# Patient Record
Sex: Male | Born: 1985 | Race: White | Hispanic: No | Marital: Married | Smoking: Never smoker
Health system: Southern US, Community
[De-identification: ages and names within clinical notes are randomized; demographics above are authoritative.]

## PROBLEM LIST (undated history)

## (undated) DIAGNOSIS — K635 Polyp of colon: Secondary | ICD-10-CM

## (undated) DIAGNOSIS — R198 Other specified symptoms and signs involving the digestive system and abdomen: Secondary | ICD-10-CM

## (undated) DIAGNOSIS — K589 Irritable bowel syndrome without diarrhea: Secondary | ICD-10-CM

## (undated) DIAGNOSIS — R09A2 Foreign body sensation, throat: Secondary | ICD-10-CM

## (undated) DIAGNOSIS — F419 Anxiety disorder, unspecified: Secondary | ICD-10-CM

## (undated) DIAGNOSIS — N200 Calculus of kidney: Secondary | ICD-10-CM

## (undated) DIAGNOSIS — K219 Gastro-esophageal reflux disease without esophagitis: Secondary | ICD-10-CM

## (undated) DIAGNOSIS — Z973 Presence of spectacles and contact lenses: Secondary | ICD-10-CM

## (undated) DIAGNOSIS — R0989 Other specified symptoms and signs involving the circulatory and respiratory systems: Secondary | ICD-10-CM

## (undated) DIAGNOSIS — U071 COVID-19: Secondary | ICD-10-CM

## (undated) DIAGNOSIS — Z8619 Personal history of other infectious and parasitic diseases: Secondary | ICD-10-CM

## (undated) DIAGNOSIS — I708 Atherosclerosis of other arteries: Secondary | ICD-10-CM

## (undated) DIAGNOSIS — R599 Enlarged lymph nodes, unspecified: Secondary | ICD-10-CM

## (undated) HISTORY — DX: Enlarged lymph nodes, unspecified: R59.9

## (undated) HISTORY — DX: Other specified symptoms and signs involving the circulatory and respiratory systems: R09.89

## (undated) HISTORY — PX: FINGER SURGERY: SHX640

## (undated) HISTORY — DX: Anxiety disorder, unspecified: F41.9

## (undated) HISTORY — PX: VASECTOMY: SHX75

## (undated) HISTORY — DX: Atherosclerosis of other arteries: I70.8

## (undated) HISTORY — PX: TONSILLECTOMY: SUR1361

## (undated) HISTORY — DX: Calculus of kidney: N20.0

## (undated) HISTORY — DX: Foreign body sensation, throat: R09.A2

## (undated) HISTORY — PX: WISDOM TOOTH EXTRACTION: SHX21

## (undated) HISTORY — DX: Personal history of other infectious and parasitic diseases: Z86.19

## (undated) HISTORY — DX: Irritable bowel syndrome, unspecified: K58.9

## (undated) HISTORY — DX: COVID-19: U07.1

## (undated) HISTORY — PX: COLONOSCOPY WITH ESOPHAGOGASTRODUODENOSCOPY (EGD): SHX5779

## (undated) HISTORY — DX: Other specified symptoms and signs involving the digestive system and abdomen: R19.8

## (undated) HISTORY — DX: Polyp of colon: K63.5

---
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
## 2004-10-23 ENCOUNTER — Emergency Department (HOSPITAL_COMMUNITY): Admission: EM | Admit: 2004-10-23 | Discharge: 2004-10-23 | Payer: Self-pay | Admitting: Emergency Medicine

## 2010-10-28 ENCOUNTER — Other Ambulatory Visit
Admission: RE | Admit: 2010-10-28 | Discharge: 2010-10-28 | Payer: Self-pay | Source: Home / Self Care | Admitting: Otolaryngology

## 2010-11-16 ENCOUNTER — Other Ambulatory Visit: Payer: Self-pay | Admitting: Otolaryngology

## 2010-11-16 DIAGNOSIS — R221 Localized swelling, mass and lump, neck: Secondary | ICD-10-CM

## 2010-11-18 ENCOUNTER — Ambulatory Visit
Admission: RE | Admit: 2010-11-18 | Discharge: 2010-11-18 | Disposition: A | Discharge status: Home / Self Care | Payer: Commercial Managed Care - PPO | Source: Ambulatory Visit | Attending: Otolaryngology | Admitting: Otolaryngology

## 2010-11-18 DIAGNOSIS — R221 Localized swelling, mass and lump, neck: Secondary | ICD-10-CM

## 2010-11-18 MED ORDER — IOHEXOL 300 MG/ML  SOLN: 75.0000 mL | Freq: Once | INTRAMUSCULAR | Status: AC | PRN

## 2014-01-23 ENCOUNTER — Ambulatory Visit
Admission: RE | Admit: 2014-01-23 | Discharge: 2014-01-23 | Disposition: A | Discharge status: Home / Self Care | Payer: Commercial Managed Care - PPO | Source: Ambulatory Visit | Attending: Family Medicine | Admitting: Family Medicine

## 2014-01-23 ENCOUNTER — Other Ambulatory Visit: Payer: Self-pay | Admitting: Family Medicine

## 2014-01-23 DIAGNOSIS — R109 Unspecified abdominal pain: Secondary | ICD-10-CM

## 2014-02-04 ENCOUNTER — Emergency Department: Payer: Self-pay | Admitting: Emergency Medicine

## 2014-02-04 LAB — CBC WITH DIFFERENTIAL/PLATELET
Basophil #: 0 10*3/uL (ref 0.0–0.1)
Basophil %: 0.3 %
EOS PCT: 2.2 %
Eosinophil #: 0.2 10*3/uL (ref 0.0–0.7)
HCT: 46.4 % (ref 40.0–52.0)
HGB: 15.5 g/dL (ref 13.0–18.0)
LYMPHS ABS: 1.4 10*3/uL (ref 1.0–3.6)
LYMPHS PCT: 20 %
MCH: 30.8 pg (ref 26.0–34.0)
MCHC: 33.5 g/dL (ref 32.0–36.0)
MCV: 92 fL (ref 80–100)
MONO ABS: 0.4 x10 3/mm (ref 0.2–1.0)
Monocyte %: 6.2 %
Neutrophil #: 5.1 10*3/uL (ref 1.4–6.5)
Neutrophil %: 71.3 %
Platelet: 207 10*3/uL (ref 150–440)
RBC: 5.04 10*6/uL (ref 4.40–5.90)
RDW: 12.7 % (ref 11.5–14.5)
WBC: 7.1 10*3/uL (ref 3.8–10.6)

## 2014-02-04 LAB — COMPREHENSIVE METABOLIC PANEL
ALBUMIN: 4.5 g/dL (ref 3.4–5.0)
Alkaline Phosphatase: 86 U/L
Anion Gap: 2 — ABNORMAL LOW (ref 7–16)
BILIRUBIN TOTAL: 0.5 mg/dL (ref 0.2–1.0)
BUN: 20 mg/dL — AB (ref 7–18)
Calcium, Total: 9.3 mg/dL (ref 8.5–10.1)
Chloride: 103 mmol/L (ref 98–107)
Co2: 32 mmol/L (ref 21–32)
Creatinine: 0.83 mg/dL (ref 0.60–1.30)
EGFR (African American): >60
GLUCOSE: 67 mg/dL (ref 65–99)
OSMOLALITY: 275 (ref 275–301)
Potassium: 4.2 mmol/L (ref 3.5–5.1)
SGOT(AST): 32 U/L (ref 15–37)
SGPT (ALT): 46 U/L (ref 12–78)
Sodium: 137 mmol/L (ref 136–145)
Total Protein: 8.2 g/dL (ref 6.4–8.2)

## 2014-02-04 LAB — URINALYSIS, COMPLETE
Bacteria: NONE SEEN
Bilirubin,UR: NEGATIVE
Glucose,UR: NEGATIVE mg/dL (ref 0–75)
KETONE: NEGATIVE
Leukocyte Esterase: NEGATIVE
Nitrite: NEGATIVE
Ph: 6 (ref 4.5–8.0)
Protein: NEGATIVE
RBC,UR: <1 /HPF (ref 0–5)
Specific Gravity: 1.02 (ref 1.003–1.030)
Squamous Epithelial: NONE SEEN
WBC UR: <1 /HPF (ref 0–5)

## 2014-02-04 LAB — LIPASE, BLOOD: LIPASE: 148 U/L (ref 73–393)

## 2014-04-12 ENCOUNTER — Ambulatory Visit: Payer: Self-pay | Admitting: Gastroenterology

## 2014-08-28 ENCOUNTER — Ambulatory Visit (INDEPENDENT_AMBULATORY_CARE_PROVIDER_SITE_OTHER): Payer: Commercial Managed Care - PPO | Admitting: Physician Assistant

## 2014-08-28 ENCOUNTER — Encounter: Payer: Self-pay | Admitting: Physician Assistant

## 2014-08-28 ENCOUNTER — Ambulatory Visit (HOSPITAL_BASED_OUTPATIENT_CLINIC_OR_DEPARTMENT_OTHER)
Admission: RE | Admit: 2014-08-28 | Discharge: 2014-08-28 | Disposition: A | Discharge status: Home / Self Care | Payer: Commercial Managed Care - PPO | Source: Ambulatory Visit | Attending: Physician Assistant | Admitting: Physician Assistant

## 2014-08-28 VITALS — BP 140/71 | HR 60 | Temp 98.6°F | Resp 16 | Ht 74.0 in | Wt 196.5 lb

## 2014-08-28 DIAGNOSIS — R0789 Other chest pain: Secondary | ICD-10-CM

## 2014-08-28 DIAGNOSIS — R079 Chest pain, unspecified: Secondary | ICD-10-CM | POA: Diagnosis not present

## 2014-08-28 DIAGNOSIS — F411 Generalized anxiety disorder: Secondary | ICD-10-CM

## 2014-08-28 MED ORDER — MELOXICAM 7.5 MG PO TABS: 7.5000 mg | ORAL_TABLET | Freq: Every day | ORAL | Status: DC

## 2014-08-28 NOTE — Progress Notes (Signed)
   Patient presents to clinic today to establish care.   Acute Concerns: Patient endorses left-sided chest pain for the past week.  Patient endorses recent bronchitis with pretty intense coughing.  Is unsure if pain is related to this.  Denies hx of asthma.  Denies hx of allergies.  Denies SOB. Endorses history of acid reflux requiring PPI therapy.  Endorses improvement in symptoms with Ibuprofen. Also endorses some increased belching over the past few days and some mild reflux.  Chronic Issues: Generalized Anxiety Disorder -- Since father passed away 2 years ago. Has been previously well controlled on 10 mg Lexapro daily. Denies depressed mood, anhedonia or SI/HI.  Denies history of panic attack.  Health Maintenance: Dental -- up-to-date Vision -- up-to-date; wears contacts; denies vision changes Immunizations -- Will be getting flu shot today.  Tetanus shot in 2012.  Past Medical History  Diagnosis Date  . Anxiety   . History of chicken pox   . Lymph node enlargement   . Colon polyp     Benign, colonoscopy 2015    Past Surgical History  Procedure Laterality Date  . Wisdom tooth extraction    . Tonsillectomy    . Finger surgery      fracture    No current outpatient prescriptions on file prior to visit.   No current facility-administered medications on file prior to visit.    No Known Allergies  Family History  Problem Relation Age of Onset  . Thyroid disease Mother     Living  . Cirrhosis Father [id number]    Deceased  . Cancer Paternal Grandfather     Lung Cancer  . Cancer Maternal Grandfather   . Lymphoma Sister     Neck  . Skin cancer Sister     History   Social History  . Marital Status: Single    Spouse Name: N/A    Number of Children: N/A  . Years of Education: N/A   Occupational History  . 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 Smoking status: Never Smoker   . Smokeless tobacco: Never Used  . Alcohol Use: 0.0 oz/week    0 Not specified  per week     Comment: rare  . Drug Use: No  . Sexual Activity:    Partners: Female   Other Topics Concern  . Not on file   Social History Narrative   ROS See HPI.  All other ROS are negative.  BP 140/71 mmHg  Pulse 60  Temp(Src) 98.6 F (37 C) (Oral)  Resp 16  Ht 6\' 2"  (1.88 m)  Wt 196 lb 8 oz (89.132 kg)  BMI 25.22 kg/m2  SpO2 100%  Physical Exam  Vitals reviewed.  Assessment/Plan: Left-sided chest wall pain Giving history sounds MSK in nature with maybe some mild pleurisy after recent URI.  EKG obtained revealing NSR at rate of 65 bpm.  Will obtain CXR to rule out pulmonary cause of symptoms.  Will begin Meloxicam 5 mg daily over the next week.  Patient also with some mild GERD so Zantac BID will be started. Topical Aspercreme to area. Follow-up in 1 week.  Addendum -- CXR negative for acute concerns.  Does reveal some changes coinciding with patient's resolved URI symptoms. Again continue care as discussed.  Follow-up 1 week.  Generalized anxiety disorder Patient doing well with Lexapro.  Negative SI/HI. Continue current regimen.  Follow-up in 6 months for re-evaluation

## 2014-08-28 NOTE — Progress Notes (Signed)
Pre visit review using our clinic review tool, if applicable. No additional management support is needed unless otherwise documented below in the visit note/SLS  

## 2014-08-28 NOTE — Assessment & Plan Note (Signed)
Patient doing well with Lexapro.  Negative SI/HI. Continue current regimen.  Follow-up in 6 months for re-evaluation

## 2014-08-28 NOTE — Patient Instructions (Signed)
Please take Mobic daily as directed.  Increase fluids.  Use tylenol for breakthrough pain.  Start Zantac 150 mg twice daily over the next week to help with reflux symptoms. Go downstairs for your x-ray. I will call you with your results.  Follow-up with me in 1 week unless I tell you otherwise when we call you with results.  If chest pain acutely worsens or you notice racing heart of shortness of breath, please call 911 or have someone drive you to the nearest ER.

## 2014-08-28 NOTE — Assessment & Plan Note (Addendum)
Giving history sounds MSK in nature with maybe some mild pleurisy after recent URI.  EKG obtained revealing NSR at rate of 65 bpm.  Will obtain CXR to rule out pulmonary cause of symptoms.  Will begin Meloxicam 5 mg daily over the next week.  Patient also with some mild GERD so Zantac BID will be started. Topical Aspercreme to area. Follow-up in 1 week.  Addendum -- CXR negative for acute concerns.  Does reveal some changes coinciding with patient's resolved URI symptoms. Again continue care as discussed.  Follow-up 1 week.

## 2014-10-24 ENCOUNTER — Ambulatory Visit: Payer: Self-pay | Admitting: Gastroenterology

## 2014-11-07 ENCOUNTER — Ambulatory Visit (INDEPENDENT_AMBULATORY_CARE_PROVIDER_SITE_OTHER): Payer: Commercial Managed Care - PPO | Admitting: Medical

## 2014-11-07 ENCOUNTER — Encounter: Payer: Self-pay | Admitting: Medical

## 2014-11-07 VITALS — BP 144/73 | HR 67 | Temp 99.1°F | Ht 74.0 in | Wt 203.4 lb

## 2014-11-07 DIAGNOSIS — K219 Gastro-esophageal reflux disease without esophagitis: Secondary | ICD-10-CM | POA: Insufficient documentation

## 2014-11-07 DIAGNOSIS — K2100 Gastro-esophageal reflux disease with esophagitis, without bleeding: Secondary | ICD-10-CM

## 2014-11-07 NOTE — Assessment & Plan Note (Signed)
Regarding your hx of some reflux distal esophagus by EGD and sensation of something in throat intermittently for months, I will try to get GI report. But during the interim I want you to take zantac otc twice daily and continue your delixant.  I will also send referral note asking when you were to follow up and whether Gi would consider cricopharyngeal spasm as diagnosis.

## 2014-11-07 NOTE — Progress Notes (Signed)
Subjective:    Patient ID: Adam Bishop, male    DOB: 11/25/1985, 29 y.o.   MRN: [id number]  HPI   Pt in stating in November he had some throat issues. Pt states feels like something in throat. He points to area over thyroid. Pt states Cody(PA-C) thought maybe was reflux related. Pt states pain symptoms will wax and wane. Back in November he was burping a lot. Symptoms will occur intermitently daily basis.   Pt states GI was in [place]. Pt was told just some inflammation described at distal esophagus. Pt saw Dr. Servando Snare [facility] with [facility]. Number [phone].  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 He took one of wife xanax and he felt like throat was little better.  Pt drinks very little alcohol. Pt eats moderate amount of fried foods. Some coffee in the am.  Pt denies any fatigue.   Pt has seen ENT in the past. He evaluated lymph nodes. Ct scan seen in the chart. Pt expresses he does not want to see ENT presently.  Past Medical History  Diagnosis Date  . Anxiety   . History of chicken pox   . Lymph node enlargement   . Colon polyp     Benign, colonoscopy 2015    History   Social History  . Marital Status: Single    Spouse Name: N/A    Number of Children: N/A  . Years of Education: N/A   Occupational History  . 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 Smoking status: Never Smoker   . Smokeless tobacco: Never Used  . Alcohol Use: 0.0 oz/week    0 Not specified per week     Comment: rare  . Drug Use: No  . Sexual Activity:    Partners: Female   Other Topics Concern  . Not on file   Social History Narrative    Past Surgical History  Procedure Laterality Date  . Wisdom tooth extraction    . Tonsillectomy    . Finger surgery      fracture    Family History  Problem Relation Age of Onset  . Thyroid disease Mother     Living  . Cirrhosis Father 42    Deceased  . Cancer Paternal Grandfather     Lung Cancer  . Cancer Maternal Grandfather    . Lymphoma Sister     Neck  . Skin cancer Sister     No Known Allergies  Current Outpatient Prescriptions on File Prior to Visit  Medication Sig Dispense Refill  . escitalopram (LEXAPRO) 10 MG tablet Take 10 mg by mouth daily.    . meloxicam (MOBIC) 7.5 MG tablet Take 1 tablet (7.5 mg total) by mouth daily. (Patient not taking: Reported on 11/07/2014) 30 tablet 0   No current facility-administered medications on file prior to visit.    BP 144/73 mmHg  Pulse 67  Temp(Src) 99.1 F (37.3 C) (Oral)  Ht  (1.88 m)  Wt 203 lb 6.4 oz (92.262 kg)  BMI 26.10 kg/m2  SpO2 99%            Review of Systems  Constitutional: Negative for fever, chills and fatigue.  HENT: Negative for congestion, ear discharge, ear pain, nosebleeds, postnasal drip, rhinorrhea, sinus pressure, sneezing, sore throat and trouble swallowing.   Respiratory: Negative for cough, chest tightness, shortness of breath and wheezing.   Cardiovascular: Negative for chest pain and palpitations.  Gastrointestinal: Negative for nausea, abdominal pain, diarrhea and  rectal pain.       Sensation of something in throat on and off since November. Evaluated by egd by GI.  Musculoskeletal: Negative for back pain.  Neurological: Negative for dizziness, tremors, seizures, syncope, facial asymmetry, weakness, light-headedness, numbness and headaches.  Hematological: Negative for adenopathy. Does not bruise/bleed easily.  Psychiatric/Behavioral: Negative for suicidal ideas, behavioral problems, self-injury and dysphoric mood. The patient is not nervous/anxious.        Objective:   Physical Exam   General Appearance- Not in acute distress.  HEENT Eyes- Scleraeral/Conjuntiva-bilat- Not Yellow. Mouth & Throat- Normal.  Skin Rashes- No Rashes.  HEENT Head- Normal. Ear Auditory Canal - Left- Normal. Right - Normal.Tympanic Membrane- Left- Normal. Right- Normal. Eye Sclera/Conjunctiva- Left- Normal. Right-  Normal. Nose & Sinuses Nasal Mucosa- Left-  Not boggy or Congested. Right-  Not  boggy or Congested. Mouth & Throat Lips: Upper Lip- Normal: no dryness, cracking, pallor, cyanosis, or vesicular eruption. Lower Lip-Normal: no dryness, cracking, pallor, cyanosis or vesicular eruption. Buccal Mucosa- Bilateral- No Aphthous ulcers. Oropharynx- No Discharge or Erythema. Tonsils: Characteristics- Bilateral- No Erythema or Congestion. Size/Enlargement- Bilateral- No enlargement. Discharge- bilateral-None.  Neck Neck- Supple. No Masses. No thyromegaly. No lymphadenopathy.      Chest and Lung Exam Auscultation: Breath sounds:-Normal. Adventitious sounds:- No Adventitious sounds.  Cardiovascular Auscultation:Rythm - Regular. Heart Sounds -Normal heart sounds.  Abdomen Inspection:-Inspection Normal.  Palpation/Perucssion: Palpation and Percussion of the abdomen reveal- Non Tender, No Rebound tenderness, No rigidity(Guarding) and No Palpable abdominal masses.  Liver:-Normal.  Spleen:- Normal.   .        Assessment & Plan:

## 2014-11-07 NOTE — Progress Notes (Signed)
Pre visit review using our clinic review tool, if applicable. No additional management support is needed unless otherwise documented below in the visit note. 

## 2014-11-07 NOTE — Patient Instructions (Addendum)
Regarding your hx of some reflux distal esophagus by EGD and sensation of something in throat intermittently for months, I will try to get GI report. But during the interim I want you to take zantac otc twice daily and continue your delixant.  I will also send referral note asking when you were to follow up and whether Gi would consider cricopharyngeal spasm as diagnosis.  Follow up 3 wks or as needed.  Pt explains to me that he does not have sore throat like infection and discounted testing for that at onset of exam.

## 2015-01-01 ENCOUNTER — Encounter: Payer: Self-pay | Admitting: Adult Health

## 2015-01-01 ENCOUNTER — Ambulatory Visit (INDEPENDENT_AMBULATORY_CARE_PROVIDER_SITE_OTHER): Payer: Commercial Managed Care - PPO | Admitting: Adult Health

## 2015-01-01 DIAGNOSIS — J029 Acute pharyngitis, unspecified: Secondary | ICD-10-CM | POA: Diagnosis not present

## 2015-01-01 LAB — POCT RAPID STREP A (OFFICE): Rapid Strep A Screen: NEGATIVE

## 2015-01-01 MED ORDER — LIDOCAINE VISCOUS 2 % MT SOLN: 15.0000 mL | OROMUCOSAL | Status: DC | PRN

## 2015-01-01 NOTE — Patient Instructions (Addendum)
    Rapid strep came back negative. You likely have viral pharyngytis. Antibiotics are not warrented at this time. I have prescribed a lidocaine which you can use to help numb your throat to provide relief. Also,use ibuprofen 600mg  every 6 hours for pain and to reduce the inflammation. If your condition worsens or you start to have a fever greater than 101F, please call and let me know. You should start to feel better in a few days.    Pharyngitis Pharyngitis is a sore throat (pharynx). There is redness, pain, and swelling of your throat. HOME CARE   Drink enough fluids to keep your pee (urine) clear or pale yellow.  Only take medicine as told by your doctor.  You may get sick again if you do not take medicine as told. Finish your medicines, even if you start to feel better.  Do not take aspirin.  Rest.  Rinse your mouth (gargle) with salt water ( tsp of salt per 1 qt of water) every 1-2 hours. This will help the pain.  If you are not at risk for choking, you can suck on hard candy or sore throat lozenges. GET HELP IF:  You have large, tender lumps on your neck.  You have a rash.  You cough up green, yellow-brown, or bloody spit. GET HELP RIGHT AWAY IF:   You have a stiff neck.  You drool or cannot swallow liquids.  You throw up (vomit) or are not able to keep medicine or liquids down.  You have very bad pain that does not go away with medicine.  You have problems breathing (not from a stuffy nose). MAKE SURE YOU:   Understand these instructions.  Will watch your condition.  Will get help right away if you are not doing well or get worse. Document Released: 03/22/2008 Document Revised: 07/25/2013 Document Reviewed: 06/11/2013 [facility] Patient Information 2015 [place], [place]. This information is not intended to replace advice given to you by your health care provider. Make sure you discuss any questions you have with your health care provider.

## 2015-01-01 NOTE — Progress Notes (Signed)
Subjective:    Patient ID: Adam Bishop, male    DOB: 1986/06/23, 29 y.o.   MRN: [id number]  HPI  Adam Bishop is a 29 year old healthy male who presents to the office sore throat since Thursday, but has been becoming progressivly worse. Patient endorses pain with swallowing. Has tried cough drops and tylenol cold with minimal relief. Denies cough or fever at home. Wife is a Midwifekindergarten teacher and has been sick with a sinus infection lately.   Past Medical History  Diagnosis Date  . Anxiety   . History of chicken pox   . Lymph node enlargement   . Colon polyp     Benign, colonoscopy 2015    History   Social History  . Marital Status: Single    Spouse Name: N/A  . Number of Children: N/A  . Years of Education: N/A   Occupational History  . 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 Smoking status: Never Smoker   . Smokeless tobacco: Never Used  . Alcohol Use: 0.0 oz/week    0 Standard drinks or equivalent per week     Comment: rare  . Drug Use: No  . Sexual Activity:    Partners: Female   Other Topics Concern  . Not on file   Social History Narrative    Past Surgical History  Procedure Laterality Date  . Wisdom tooth extraction    . Tonsillectomy    . Finger surgery      fracture   No Known Allergies  Current Outpatient Prescriptions on File Prior to Visit  Medication Sig Dispense Refill  . dexlansoprazole (DEXILANT) 60 MG capsule Take 60 mg by mouth daily.    Marland Kitchen. escitalopram (LEXAPRO) 10 MG tablet Take 10 mg by mouth daily.    . meloxicam (MOBIC) 7.5 MG tablet Take 1 tablet (7.5 mg total) by mouth daily. (Patient not taking: Reported on 01/01/2015) 30 tablet 0   No current facility-administered medications on file prior to visit.    BP 144/77 mmHg  Pulse 73  Temp(Src) 98.3 F (36.8 C)  Wt 196 lb (88.905 kg)  SpO2 98%     Review of Systems  Constitutional: Negative for fever, chills, activity change, appetite change and fatigue.  HENT:  Positive for ear pain and sore throat. Negative for congestion and facial swelling.   Eyes: Negative for pain and discharge.  Respiratory: Negative for cough and shortness of breath.   Gastrointestinal: Negative for nausea, vomiting and diarrhea.  Musculoskeletal: Negative for myalgias and neck pain.        Objective:   Physical Exam  Constitutional: He is oriented to person, place, and time. He appears well-developed and well-nourished.  HENT:  Head: Normocephalic and atraumatic.  Right Ear: External ear normal.  Left Ear: External ear normal.  Mouth/Throat: No oropharyngeal exudate.  Eyes: Pupils are equal, round, and reactive to light. Right eye exhibits no discharge. Left eye exhibits no discharge.  Neck: Normal range of motion. Neck supple.  Cardiovascular: Normal rate, regular rhythm and normal heart sounds.   Pulmonary/Chest: Effort normal and breath sounds normal.  Lymphadenopathy:    He has cervical adenopathy.  Neurological: He is alert and oriented to person, place, and time.          Assessment & Plan:   Vital Pharyngitis - Rapid strep negative - Prescribed lidocaine viscous 15ml gargle and spit PRN for throat pain  - Motrin 600mg  Q 6 H PRN for throat  pain and inflammation  - Call if not better within five days or sooner if starts having fever.

## 2015-01-01 NOTE — Progress Notes (Signed)
Pre visit review using our clinic review tool, if applicable. No additional management support is needed unless otherwise documented below in the visit note. 

## 2015-02-17 ENCOUNTER — Encounter: Payer: Self-pay | Admitting: Physician Assistant

## 2015-02-17 MED ORDER — ESCITALOPRAM OXALATE 10 MG PO TABS: 10.0000 mg | ORAL_TABLET | Freq: Every day | ORAL | Status: DC

## 2015-02-17 NOTE — Addendum Note (Signed)
Addended by: Marcelline MatesMARTIN, Efraim Vanallen on: 02/17/2015 04:09 PM   Modules accepted: Orders

## 2015-06-03 ENCOUNTER — Telehealth: Payer: Self-pay | Admitting: *Deleted

## 2015-06-03 NOTE — Telephone Encounter (Signed)
Unable to reach patient at time of Pre-Visit Call.  Left message for patient to return call when available.    

## 2015-06-04 ENCOUNTER — Ambulatory Visit (INDEPENDENT_AMBULATORY_CARE_PROVIDER_SITE_OTHER): Payer: Commercial Managed Care - PPO | Admitting: Physician Assistant

## 2015-06-04 ENCOUNTER — Encounter: Payer: Self-pay | Admitting: Physician Assistant

## 2015-06-04 VITALS — BP 130/70 | HR 67 | Temp 97.7°F | Ht 74.0 in | Wt 183.0 lb

## 2015-06-04 DIAGNOSIS — F411 Generalized anxiety disorder: Secondary | ICD-10-CM

## 2015-06-04 DIAGNOSIS — K2100 Gastro-esophageal reflux disease with esophagitis, without bleeding: Secondary | ICD-10-CM

## 2015-06-04 DIAGNOSIS — Z0000 Encounter for general adult medical examination without abnormal findings: Secondary | ICD-10-CM | POA: Diagnosis not present

## 2015-06-04 DIAGNOSIS — R1032 Left lower quadrant pain: Secondary | ICD-10-CM

## 2015-06-04 LAB — CBC
HCT: 46.7 % (ref 39.0–52.0)
Hemoglobin: 15.4 g/dL (ref 13.0–17.0)
MCHC: 33 g/dL (ref 30.0–36.0)
MCV: 92.4 fl (ref 78.0–100.0)
Platelets: 206 10*3/uL (ref 150.0–400.0)
RBC: 5.05 Mil/uL (ref 4.22–5.81)
RDW: 13.1 % (ref 11.5–15.5)
WBC: 5.6 10*3/uL (ref 4.0–10.5)

## 2015-06-04 LAB — LIPID PANEL
CHOLESTEROL: 168 mg/dL (ref 0–200)
HDL: 39.6 mg/dL (ref >39.00)
LDL Cholesterol: 120 mg/dL — ABNORMAL HIGH (ref 0–99)
NonHDL: 128.6
TRIGLYCERIDES: 42 mg/dL (ref 0.0–149.0)
Total CHOL/HDL Ratio: 4
VLDL: 8.4 mg/dL (ref 0.0–40.0)

## 2015-06-04 LAB — COMPREHENSIVE METABOLIC PANEL
ALBUMIN: 4.7 g/dL (ref 3.5–5.2)
ALT: 30 U/L (ref 0–53)
AST: 20 U/L (ref 0–37)
Alkaline Phosphatase: 67 U/L (ref 39–117)
BUN: 19 mg/dL (ref 6–23)
CHLORIDE: 102 meq/L (ref 96–112)
CO2: 32 mEq/L (ref 19–32)
Calcium: 9.9 mg/dL (ref 8.4–10.5)
Creatinine, Ser: 0.84 mg/dL (ref 0.40–1.50)
GFR: 113.96 mL/min (ref >60.00)
Glucose, Bld: 89 mg/dL (ref 70–99)
Potassium: 4.3 mEq/L (ref 3.5–5.1)
Sodium: 140 mEq/L (ref 135–145)
Total Bilirubin: 0.9 mg/dL (ref 0.2–1.2)
Total Protein: 7.4 g/dL (ref 6.0–8.3)

## 2015-06-04 LAB — URINALYSIS, ROUTINE W REFLEX MICROSCOPIC
BILIRUBIN URINE: NEGATIVE
HGB URINE DIPSTICK: NEGATIVE
Ketones, ur: NEGATIVE
Leukocytes, UA: NEGATIVE
Nitrite: NEGATIVE
PH: 6.5 (ref 5.0–8.0)
RBC / HPF: NONE SEEN (ref 0–?)
Specific Gravity, Urine: 1.02 (ref 1.000–1.030)
Total Protein, Urine: NEGATIVE
Urine Glucose: NEGATIVE
Urobilinogen, UA: 0.2 (ref 0.0–1.0)
WBC UA: NONE SEEN (ref 0–?)

## 2015-06-04 LAB — H. PYLORI ANTIBODY, IGG: H Pylori IgG: NEGATIVE

## 2015-06-04 LAB — TSH: TSH: 1.06 u[IU]/mL (ref 0.35–4.50)

## 2015-06-04 MED ORDER — OMEPRAZOLE 40 MG PO CPDR: 40.0000 mg | DELAYED_RELEASE_CAPSULE | Freq: Every day | ORAL | Status: DC

## 2015-06-04 MED ORDER — CIPROFLOXACIN HCL 500 MG PO TABS: 500.0000 mg | ORAL_TABLET | Freq: Two times a day (BID) | ORAL | Status: DC

## 2015-06-04 NOTE — Patient Instructions (Signed)
Please go to the lab for blood work.  I will call you with your results. If your blood work is normal we will follow-up yearly for physicals.    Please stay well hydrated and begin a probiotic daily (Align, Culturelle, One-A-Day) Resume your omeprazole as directed. Read information below on foods to avoid with GERD.  Please take antibiotic as directed for potential mild colonic infection. Follow-up 1 week.  Food Choices for Gastroesophageal Reflux Disease When you have gastroesophageal reflux disease (GERD), the foods you eat and your eating habits are very important. Choosing the right foods can help ease the discomfort of GERD. WHAT GENERAL GUIDELINES DO I NEED TO FOLLOW?  Choose fruits, vegetables, whole grains, low-fat dairy products, and low-fat meat, fish, and poultry.  Limit fats such as oils, salad dressings, butter, nuts, and avocado.  Keep a food diary to identify foods that cause symptoms.  Avoid foods that cause reflux. These may be different for different people.  Eat frequent small meals instead of three large meals each day.  Eat your meals slowly, in a relaxed setting.  Limit fried foods.  Cook foods using methods other than frying.  Avoid drinking alcohol.  Avoid drinking large amounts of liquids with your meals.  Avoid bending over or lying down until 2-3 hours after eating. WHAT FOODS ARE NOT RECOMMENDED? The following are some foods and drinks that may worsen your symptoms: Vegetables Tomatoes. Tomato juice. Tomato and spaghetti sauce. Chili peppers. Onion and garlic. Horseradish. Fruits Oranges, grapefruit, and lemon (fruit and juice). Meats High-fat meats, fish, and poultry. This includes hot dogs, ribs, ham, sausage, salami, and bacon. Dairy Whole milk and chocolate milk. Sour cream. Cream. Butter. Ice cream. Cream cheese.  Beverages Coffee and tea, with or without caffeine. Carbonated beverages or energy drinks. Condiments Hot sauce. Barbecue  sauce.  Sweets/Desserts Chocolate and cocoa. Donuts. Peppermint and spearmint. Fats and Oils High-fat foods, including [place] fries and potato chips. Other Vinegar. Strong spices, such as black pepper, white pepper, red pepper, cayenne, curry powder, cloves, ginger, and chili powder. The items listed above may not be a complete list of foods and beverages to avoid. Contact your dietitian for more information. Document Released: 10/04/2005 Document Revised: 10/09/2013 Document Reviewed: 08/08/2013 [facility] Patient Information 2015 [place], [place]. This information is not intended to replace advice given to you by your health care provider. Make sure you discuss any questions you have with your health care provider.  Preventive Care for Adults A healthy lifestyle and preventive care can promote health and wellness. Preventive health guidelines for men include the following key practices:  A routine yearly physical is a good way to check with your health care provider about your health and preventative screening. It is a chance to share any concerns and updates on your health and to receive a thorough exam.  Visit your dentist for a routine exam and preventative care every 6 months. Brush your teeth twice a day and floss once a day. Good oral hygiene prevents tooth decay and gum disease.  The frequency of eye exams is based on your age, health, family medical history, use of contact lenses, and other factors. Follow your health care provider's recommendations for frequency of eye exams.  Eat a healthy diet. Foods such as vegetables, fruits, whole grains, low-fat dairy products, and lean protein foods contain the nutrients you need without too many calories. Decrease your intake of foods high in solid fats, added sugars, and salt. Eat the right amount of  calories for you.Get information about a proper diet from your health care provider, if necessary.  Regular physical exercise is one of the most  important things you can do for your health. Most adults should get at least 150 minutes of moderate-intensity exercise (any activity that increases your heart rate and causes you to sweat) each week. In addition, most adults need muscle-strengthening exercises on 2 or more days a week.  Maintain a healthy weight. The body mass index (BMI) is a screening tool to identify possible weight problems. It provides an estimate of body fat based on height and weight. Your health care provider can find your BMI and can help you achieve or maintain a healthy weight.For adults 20 years and older:  A BMI below 18.5 is considered underweight.  A BMI of 18.5 to 24.9 is normal.  A BMI of 25 to 29.9 is considered overweight.  A BMI of 30 and above is considered obese.  Maintain normal blood lipids and cholesterol levels by exercising and minimizing your intake of saturated fat. Eat a balanced diet with plenty of fruit and vegetables. Blood tests for lipids and cholesterol should begin at age 48 and be repeated every 5 years. If your lipid or cholesterol levels are high, you are over 50, or you are at high risk for heart disease, you may need your cholesterol levels checked more frequently.Ongoing high lipid and cholesterol levels should be treated with medicines if diet and exercise are not working.  If you smoke, find out from your health care provider how to quit. If you do not use tobacco, do not start.  Lung cancer screening is recommended for adults aged 99-80 years who are at high risk for developing lung cancer because of a history of smoking. A yearly low-dose CT scan of the lungs is recommended for people who have at least a 30-pack-year history of smoking and are a current smoker or have quit within the past 15 years. A pack year of smoking is smoking an average of 1 pack of cigarettes a day for 1 year (for example: 1 pack a day for 30 years or 2 packs a day for 15 years). Yearly screening should  continue until the smoker has stopped smoking for at least 15 years. Yearly screening should be stopped for people who develop a health problem that would prevent them from having lung cancer treatment.  If you choose to drink alcohol, do not have more than 2 drinks per day. One drink is considered to be 12 ounces (355 mL) of beer, 5 ounces (148 mL) of wine, or 1.5 ounces (44 mL) of liquor.  Avoid use of street drugs. Do not share needles with anyone. Ask for help if you need support or instructions about stopping the use of drugs.  High blood pressure causes heart disease and increases the risk of stroke. Your blood pressure should be checked at least every 1-2 years. Ongoing high blood pressure should be treated with medicines, if weight loss and exercise are not effective.  If you are 61-7 years old, ask your health care provider if you should take aspirin to prevent heart disease.  Diabetes screening involves taking a blood sample to check your fasting blood sugar level. This should be done once every 3 years, after age 66, if you are within normal weight and without risk factors for diabetes. Testing should be considered at a younger age or be carried out more frequently if you are overweight and have at least  1 risk factor for diabetes.  Colorectal cancer can be detected and often prevented. Most routine colorectal cancer screening begins at the age of 61 and continues through age 72. However, your health care provider may recommend screening at an earlier age if you have risk factors for colon cancer. On a yearly basis, your health care provider may provide home test kits to check for hidden blood in the stool. Use of a small camera at the end of a tube to directly examine the colon (sigmoidoscopy or colonoscopy) can detect the earliest forms of colorectal cancer. Talk to your health care provider about this at age 33, when routine screening begins. Direct exam of the colon should be repeated  every 5-10 years through age 35, unless early forms of precancerous polyps or small growths are found.  People who are at an increased risk for hepatitis B should be screened for this virus. You are considered at high risk for hepatitis B if:  You were born in a country where hepatitis B occurs often. Talk with your health care provider about which countries are considered high risk.  Your parents were born in a high-risk country and you have not received a shot to protect against hepatitis B (hepatitis B vaccine).  You have HIV or AIDS.  You use needles to inject street drugs.  You live with, or have sex with, someone who has hepatitis B.  You are a man who has sex with other men (MSM).  You get hemodialysis treatment.  You take certain medicines for conditions such as cancer, organ transplantation, and autoimmune conditions.  Hepatitis C blood testing is recommended for all people born from [birth year] through 1965 and any individual with known risks for hepatitis C.  Practice safe sex. Use condoms and avoid high-risk sexual practices to reduce the spread of sexually transmitted infections (STIs). STIs include gonorrhea, chlamydia, syphilis, trichomonas, herpes, HPV, and human immunodeficiency virus (HIV). Herpes, HIV, and HPV are viral illnesses that have no cure. They can result in disability, cancer, and death.  If you are at risk of being infected with HIV, it is recommended that you take a prescription medicine daily to prevent HIV infection. This is called preexposure prophylaxis (PrEP). You are considered at risk if:  You are a man who has sex with other men (MSM) and have other risk factors.  You are a heterosexual man, are sexually active, and are at increased risk for HIV infection.  You take drugs by injection.  You are sexually active with a partner who has HIV.  Talk with your health care provider about whether you are at high risk of being infected with HIV. If you choose  to begin PrEP, you should first be tested for HIV. You should then be tested every 3 months for as long as you are taking PrEP.  A one-time screening for abdominal aortic aneurysm (AAA) and surgical repair of large AAAs by ultrasound are recommended for men ages 28 to 70 years who are current or former smokers.  Healthy men should no longer receive prostate-specific antigen (PSA) blood tests as part of routine cancer screening. Talk with your health care provider about prostate cancer screening.  Testicular cancer screening is not recommended for adult males who have no symptoms. Screening includes self-exam, a health care provider exam, and other screening tests. Consult with your health care provider about any symptoms you have or any concerns you have about testicular cancer.  Use sunscreen. Apply sunscreen liberally and repeatedly  throughout the day. You should seek shade when your shadow is shorter than you. Protect yourself by wearing long sleeves, pants, a wide-brimmed hat, and sunglasses year round, whenever you are outdoors.  Once a month, do a whole-body skin exam, using a mirror to look at the skin on your back. Tell your health care provider about new moles, moles that have irregular borders, moles that are larger than a pencil eraser, or moles that have changed in shape or color.  Stay current with required vaccines (immunizations).  Influenza vaccine. All adults should be immunized every year.  Tetanus, diphtheria, and acellular pertussis (Td, Tdap) vaccine. An adult who has not previously received Tdap or who does not know his vaccine status should receive 1 dose of Tdap. This initial dose should be followed by tetanus and diphtheria toxoids (Td) booster doses every 10 years. Adults with an unknown or incomplete history of completing a 3-dose immunization series with Td-containing vaccines should begin or complete a primary immunization series including a Tdap dose. Adults should  receive a Td booster every 10 years.  Varicella vaccine. An adult without evidence of immunity to varicella should receive 2 doses or a second dose if he has previously received 1 dose.  Human papillomavirus (HPV) vaccine. Males aged 38-21 years who have not received the vaccine previously should receive the 3-dose series. Males aged 22-26 years may be immunized. Immunization is recommended through the age of 80 years for any male who has sex with males and did not get any or all doses earlier. Immunization is recommended for any person with an immunocompromised condition through the age of 10 years if he did not get any or all doses earlier. During the 3-dose series, the second dose should be obtained 4-8 weeks after the first dose. The third dose should be obtained 24 weeks after the first dose and 16 weeks after the second dose.  Zoster vaccine. One dose is recommended for adults aged 83 years or older unless certain conditions are present.  Measles, mumps, and rubella (MMR) vaccine. Adults born before [birth year] generally are considered immune to measles and mumps. Adults born in [birth year] or later should have 1 or more doses of MMR vaccine unless there is a contraindication to the vaccine or there is laboratory evidence of immunity to each of the three diseases. A routine second dose of MMR vaccine should be obtained at least 28 days after the first dose for students attending postsecondary schools, health care workers, or international travelers. People who received inactivated measles vaccine or an unknown type of measles vaccine during 1963-1967 should receive 2 doses of MMR vaccine. People who received inactivated mumps vaccine or an unknown type of mumps vaccine before 1979 and are at high risk for mumps infection should consider immunization with 2 doses of MMR vaccine. Unvaccinated health care workers born before [birth year] who lack laboratory evidence of measles, mumps, or rubella immunity or laboratory  confirmation of disease should consider measles and mumps immunization with 2 doses of MMR vaccine or rubella immunization with 1 dose of MMR vaccine.  Pneumococcal 13-valent conjugate (PCV13) vaccine. When indicated, a person who is uncertain of his immunization history and has no record of immunization should receive the PCV13 vaccine. An adult aged 80 years or older who has certain medical conditions and has not been previously immunized should receive 1 dose of PCV13 vaccine. This PCV13 should be followed with a dose of pneumococcal polysaccharide (PPSV23) vaccine. The PPSV23 vaccine dose should be obtained  at least 8 weeks after the dose of PCV13 vaccine. An adult aged 69 years or older who has certain medical conditions and previously received 1 or more doses of PPSV23 vaccine should receive 1 dose of PCV13. The PCV13 vaccine dose should be obtained 1 or more years after the last PPSV23 vaccine dose.  Pneumococcal polysaccharide (PPSV23) vaccine. When PCV13 is also indicated, PCV13 should be obtained first. All adults aged 42 years and older should be immunized. An adult younger than age 58 years who has certain medical conditions should be immunized. Any person who resides in a nursing home or long-term care facility should be immunized. An adult smoker should be immunized. People with an immunocompromised condition and certain other conditions should receive both PCV13 and PPSV23 vaccines. People with human immunodeficiency virus (HIV) infection should be immunized as soon as possible after diagnosis. Immunization during chemotherapy or radiation therapy should be avoided. Routine use of PPSV23 vaccine is not recommended for American Indians, [place] Natives, or people younger than 65 years unless there are medical conditions that require PPSV23 vaccine. When indicated, people who have unknown immunization and have no record of immunization should receive PPSV23 vaccine. One-time revaccination 5 years  after the first dose of PPSV23 is recommended for people aged 19-64 years who have chronic kidney failure, nephrotic syndrome, asplenia, or immunocompromised conditions. People who received 1-2 doses of PPSV23 before age 7 years should receive another dose of PPSV23 vaccine at age 41 years or later if at least 5 years have passed since the previous dose. Doses of PPSV23 are not needed for people immunized with PPSV23 at or after age 80 years.  Meningococcal vaccine. Adults with asplenia or persistent complement component deficiencies should receive 2 doses of quadrivalent meningococcal conjugate (MenACWY-D) vaccine. The doses should be obtained at least 2 months apart. Microbiologists working with certain meningococcal bacteria, [place] recruits, people at risk during an outbreak, and people who travel to or live in countries with a high rate of meningitis should be immunized. A first-year college student up through age 3 years who is living in a residence hall should receive a dose if he did not receive a dose on or after his 16th birthday. Adults who have certain high-risk conditions should receive one or more doses of vaccine.  Hepatitis A vaccine. Adults who wish to be protected from this disease, have certain high-risk conditions, work with hepatitis A-infected animals, work in hepatitis A research labs, or travel to or work in countries with a high rate of hepatitis A should be immunized. Adults who were previously unvaccinated and who anticipate close contact with an international adoptee during the first 60 days after arrival in the [place] from a country with a high rate of hepatitis A should be immunized.  Hepatitis B vaccine. Adults should be immunized if they wish to be protected from this disease, have certain high-risk conditions, may be exposed to blood or other infectious body fluids, are household contacts or sex partners of hepatitis B positive people, are clients or workers in  certain care facilities, or travel to or work in countries with a high rate of hepatitis B.  Haemophilus influenzae type b (Hib) vaccine. A previously unvaccinated person with asplenia or sickle cell disease or having a scheduled splenectomy should receive 1 dose of Hib vaccine. Regardless of previous immunization, a recipient of a hematopoietic stem cell transplant should receive a 3-dose series 6-12 months after his successful transplant. Hib vaccine is not recommended for adults  with HIV infection. Preventive Service / Frequency Ages 46 to 3  Blood pressure check.** / Every 1 to 2 years.  Lipid and cholesterol check.** / Every 5 years beginning at age 31.  Hepatitis C blood test.** / For any individual with known risks for hepatitis C.  Skin self-exam. / Monthly.  Influenza vaccine. / Every year.  Tetanus, diphtheria, and acellular pertussis (Tdap, Td) vaccine.** / Consult your health care provider. 1 dose of Td every 10 years.  Varicella vaccine.** / Consult your health care provider.  HPV vaccine. / 3 doses over 6 months, if 66 or younger.  Measles, mumps, rubella (MMR) vaccine.** / You need at least 1 dose of MMR if you were born in 1957 or later. You may also need a second dose.  Pneumococcal 13-valent conjugate (PCV13) vaccine.** / Consult your health care provider.  Pneumococcal polysaccharide (PPSV23) vaccine.** / 1 to 2 doses if you smoke cigarettes or if you have certain conditions.  Meningococcal vaccine.** / 1 dose if you are age 56 to 65 years and a Market researcher living in a residence hall, or have one of several medical conditions. You may also need additional booster doses.  Hepatitis A vaccine.** / Consult your health care provider.  Hepatitis B vaccine.** / Consult your health care provider.  Haemophilus influenzae type b (Hib) vaccine.** / Consult your health care provider. Ages 37 to 22  Blood pressure check.** / Every 1 to 2 years.  Lipid and  cholesterol check.** / Every 5 years beginning at age 24.  Lung cancer screening. / Every year if you are aged 78-80 years and have a 30-pack-year history of smoking and currently smoke or have quit within the past 15 years. Yearly screening is stopped once you have quit smoking for at least 15 years or develop a health problem that would prevent you from having lung cancer treatment.  Fecal occult blood test (FOBT) of stool. / Every year beginning at age 12 and continuing until age 89. You may not have to do this test if you get a colonoscopy every 10 years.  Flexible sigmoidoscopy** or colonoscopy.** / Every 5 years for a flexible sigmoidoscopy or every 10 years for a colonoscopy beginning at age 24 and continuing until age 41.  Hepatitis C blood test.** / For all people born from [birth year] through 1966 and any individual with known risks for hepatitis C.  Skin self-exam. / Monthly.  Influenza vaccine. / Every year.  Tetanus, diphtheria, and acellular pertussis (Tdap/Td) vaccine.** / Consult your health care provider. 1 dose of Td every 10 years.  Varicella vaccine.** / Consult your health care provider.  Zoster vaccine.** / 1 dose for adults aged 63 years or older.  Measles, mumps, rubella (MMR) vaccine.** / You need at least 1 dose of MMR if you were born in 1958 or later. You may also need a second dose.  Pneumococcal 13-valent conjugate (PCV13) vaccine.** / Consult your health care provider.  Pneumococcal polysaccharide (PPSV23) vaccine.** / 1 to 2 doses if you smoke cigarettes or if you have certain conditions.  Meningococcal vaccine.** / Consult your health care provider.  Hepatitis A vaccine.** / Consult your health care provider.  Hepatitis B vaccine.** / Consult your health care provider.  Haemophilus influenzae type b (Hib) vaccine.** / Consult your health care provider. Ages 68 and over  Blood pressure check.** / Every 1 to 2 years.  Lipid and cholesterol check.**/ Every  5 years beginning at age 3.  Lung cancer  screening. / Every year if you are aged 11-80 years and have a 30-pack-year history of smoking and currently smoke or have quit within the past 15 years. Yearly screening is stopped once you have quit smoking for at least 15 years or develop a health problem that would prevent you from having lung cancer treatment.  Fecal occult blood test (FOBT) of stool. / Every year beginning at age 67 and continuing until age 3. You may not have to do this test if you get a colonoscopy every 10 years.  Flexible sigmoidoscopy** or colonoscopy.** / Every 5 years for a flexible sigmoidoscopy or every 10 years for a colonoscopy beginning at age 50 and continuing until age 25.  Hepatitis C blood test.** / For all people born from [birth year] through 1966 and any individual with known risks for hepatitis C.  Abdominal aortic aneurysm (AAA) screening.** / A one-time screening for ages 70 to 70 years who are current or former smokers.  Skin self-exam. / Monthly.  Influenza vaccine. / Every year.  Tetanus, diphtheria, and acellular pertussis (Tdap/Td) vaccine.** / 1 dose of Td every 10 years.  Varicella vaccine.** / Consult your health care provider.  Zoster vaccine.** / 1 dose for adults aged 63 years or older.  Pneumococcal 13-valent conjugate (PCV13) vaccine.** / Consult your health care provider.  Pneumococcal polysaccharide (PPSV23) vaccine.** / 1 dose for all adults aged 73 years and older.  Meningococcal vaccine.** / Consult your health care provider.  Hepatitis A vaccine.** / Consult your health care provider.  Hepatitis B vaccine.** / Consult your health care provider.  Haemophilus influenzae type b (Hib) vaccine.** / Consult your health care provider. **Family history and personal history of risk and conditions may change your health care provider's recommendations. Document Released: 11/30/2001 Document Revised: 10/09/2013 Document Reviewed:  03/01/2011 [facility] Patient Information 2015 [place], [place]. This information is not intended to replace advice given to you by your health care provider. Make sure you discuss any questions you have with your health care provider.

## 2015-06-04 NOTE — Progress Notes (Signed)
.    Patient presents to clinic today for annual exam.  Patient is fasting for labs.  Acute Concerns: Patient complains of LUQ discomfort intermittently associated with heartburn and bloating. Denies fever, chills, melena/hematochezia or tenesmus. Has + history of GERd. Was previously on Prilosec but has not taken in 2 weeks.  Health Maintenance: Dental -- up-to-date Vision -- up-to-date Immunizations -- up-to-date  Past Medical History  Diagnosis Date  . Anxiety   . History of chicken pox   . Lymph node enlargement   . Colon polyp     Benign, colonoscopy 2015    Past Surgical History  Procedure Laterality Date  . Wisdom tooth extraction    . Tonsillectomy    . Finger surgery      fracture    Current Outpatient Prescriptions on File Prior to Visit  Medication Sig Dispense Refill  . escitalopram (LEXAPRO) 10 MG tablet Take 1 tablet (10 mg total) by mouth daily. 90 tablet 1   No current facility-administered medications on file prior to visit.    No Known Allergies  Family History  Problem Relation Age of Onset  . Thyroid disease Mother     Living  . Cirrhosis Father 53    Deceased  . Cancer Paternal Grandfather     Lung Cancer  . Cancer Maternal Grandfather   . Lymphoma Sister     Neck  . Skin cancer Sister     Social History   Social History  . Marital Status: Single    Spouse Name: N/A  . Number of Children: N/A  . Years of Education: N/A   Occupational History  . Automotive Curator    Social History Main Topics  . Smoking status: Never Smoker   . Smokeless tobacco: Never Used  . Alcohol Use: 0.0 oz/week    0 Standard drinks or equivalent per week     Comment: rare  . Drug Use: No  . Sexual Activity:    Partners: Female   Other Topics Concern  . Not on file   Social History Narrative   Review of Systems  Constitutional: Negative for fever and weight loss.  HENT: Negative for ear discharge, ear pain, hearing loss and tinnitus.   Eyes:  Negative for blurred vision, double vision, photophobia and pain.  Respiratory: Negative for cough and shortness of breath.   Cardiovascular: Negative for chest pain and palpitations.  Gastrointestinal: Positive for heartburn and abdominal pain. Negative for nausea, vomiting, diarrhea, constipation, blood in stool and melena.  Genitourinary: Negative for dysuria, urgency, frequency, hematuria and flank pain.  Musculoskeletal: Negative for falls.  Neurological: Negative for dizziness, loss of consciousness and headaches.  Endo/Heme/Allergies: Negative for environmental allergies.  Psychiatric/Behavioral: Negative for depression, suicidal ideas, hallucinations and substance abuse. The patient is not nervous/anxious and does not have insomnia.     BP 130/70 mmHg  Pulse 67  Temp(Src) 97.7 F (36.5 C) (Oral)  Ht '6\' 2"'  (1.88 m)  Wt 183 lb (83.008 kg)  BMI 23.49 kg/m2  SpO2 98%  Physical Exam  Constitutional: He is oriented to person, place, and time and well-developed, well-nourished, and in no distress.  HENT:  Head: Normocephalic and atraumatic.  Right Ear: External ear normal.  Left Ear: External ear normal.  Nose: Nose normal.  Mouth/Throat: Oropharynx is clear and moist. No oropharyngeal exudate.  Eyes: Conjunctivae and EOM are normal. Pupils are equal, round, and reactive to light.  Neck: Neck supple. No thyromegaly present.  Cardiovascular: Normal rate, regular  rhythm, normal heart sounds and intact distal pulses.   Pulmonary/Chest: Effort normal and breath sounds normal. No respiratory distress. He has no wheezes. He has no rales. He exhibits no tenderness.  Abdominal: Soft. Bowel sounds are normal. He exhibits no distension and no mass. There is no tenderness. There is no rebound and no guarding.  Genitourinary: Testes/scrotum normal and penis normal. No discharge found.  Lymphadenopathy:    He has no cervical adenopathy.  Neurological: He is alert and oriented to person,  place, and time.  Skin: Skin is warm and dry. No rash noted.  Psychiatric: Affect normal.  Vitals reviewed.   Recent Results (from the past 2160 hour(s))  CBC     Status: None   Collection Time: 06/04/15  8:13 AM  Result Value Ref Range   WBC 5.6 4.0 - 10.5 K/uL   RBC 5.05 4.22 - 5.81 Mil/uL   Platelets 206.0 150.0 - 400.0 K/uL   Hemoglobin 15.4 13.0 - 17.0 g/dL   HCT 46.7 39.0 - 52.0 %   MCV 92.4 78.0 - 100.0 fl   MCHC 33.0 30.0 - 36.0 g/dL   RDW 13.1 11.5 - 15.5 %  Comp Met (CMET)     Status: None   Collection Time: 06/04/15  8:13 AM  Result Value Ref Range   Sodium 140 135 - 145 mEq/L   Potassium 4.3 3.5 - 5.1 mEq/L   Chloride 102 96 - 112 mEq/L   CO2 32 19 - 32 mEq/L   Glucose, Bld 89 70 - 99 mg/dL   BUN 19 6 - 23 mg/dL   Creatinine, Ser 0.84 0.40 - 1.50 mg/dL   Total Bilirubin 0.9 0.2 - 1.2 mg/dL   Alkaline Phosphatase 67 39 - 117 U/L   AST 20 0 - 37 U/L   ALT 30 0 - 53 U/L   Total Protein 7.4 6.0 - 8.3 g/dL   Albumin 4.7 3.5 - 5.2 g/dL   Calcium 9.9 8.4 - 10.5 mg/dL   GFR 113.96 >60.00 mL/min  TSH     Status: None   Collection Time: 06/04/15  8:13 AM  Result Value Ref Range   TSH 1.06 0.35 - 4.50 uIU/mL  Urinalysis, Routine w reflex microscopic (not at [facility])     Status: Abnormal   Collection Time: 06/04/15  8:13 AM  Result Value Ref Range   Color, Urine YELLOW Yellow;Lt. Yellow   APPearance CLEAR Clear   Specific Gravity, Urine 1.020 1.000-1.030   pH 6.5 5.0 - 8.0   Total Protein, Urine NEGATIVE Negative   Urine Glucose NEGATIVE Negative   Ketones, ur NEGATIVE Negative   Bilirubin Urine NEGATIVE Negative   Hgb urine dipstick NEGATIVE Negative   Urobilinogen, UA 0.2 0.0 - 1.0   Leukocytes, UA NEGATIVE Negative   Nitrite NEGATIVE Negative   WBC, UA none seen 0-2/hpf   RBC / HPF none seen 0-2/hpf   Mucus, UA Presence of (A) None   Squamous Epithelial / LPF Rare(0-4/hpf) Rare(0-4/hpf)  Lipid Profile     Status: Abnormal   Collection Time: 06/04/15  8:13  AM  Result Value Ref Range   Cholesterol 168 0 - 200 mg/dL    Comment: ATP III Classification       Desirable:  < 200 mg/dL               Borderline High:  200 - 239 mg/dL          High:  > = 240 mg/dL   Triglycerides  42.0 0.0 - 149.0 mg/dL    Comment: Normal:  <150 mg/dLBorderline High:  150 - 199 mg/dL   HDL 39.60 >39.00 mg/dL   VLDL 8.4 0.0 - 40.0 mg/dL   LDL Cholesterol 120 (H) 0 - 99 mg/dL   Total CHOL/HDL Ratio 4     Comment:                Men          Women1/2 Average Risk     3.4          3.3Average Risk          5.0          4.42X Average Risk          9.6          7.13X Average Risk          15.0          11.0                       NonHDL 128.60     Comment: NOTE:  Non-HDL goal should be 30 mg/dL higher than patient's LDL goal (i.e. LDL goal of < 70 mg/dL, would have non-HDL goal of < 100 mg/dL)  H. pylori antibody, IgG     Status: None   Collection Time: 06/04/15  8:13 AM  Result Value Ref Range   H Pylori IgG Negative Negative    Assessment/Plan: GERD (gastroesophageal reflux disease) Will check CBC, CMP, H. Pylori. Prilosec 40 mg QD. Start daily probiotic. Dietary measures discussed. Will treat further based on results. Follow-up 2 weeks if symptoms not resolving. Imaging if anything worsening.  Visit for preventive health examination Depression screen negative. Health Maintenance reviewed -- Immunizations up-to-date. Preventive schedule discussed and handout given in AVS. Will obtain fasting labs today.

## 2015-06-04 NOTE — Progress Notes (Signed)
Pre visit review using our clinic review tool, if applicable. No additional management support is needed unless otherwise documented below in the visit note. 

## 2015-06-07 DIAGNOSIS — Z0000 Encounter for general adult medical examination without abnormal findings: Secondary | ICD-10-CM | POA: Insufficient documentation

## 2015-06-07 NOTE — Assessment & Plan Note (Signed)
Will check CBC, CMP, H. Pylori. Prilosec 40 mg QD. Start daily probiotic. Dietary measures discussed. Will treat further based on results. Follow-up 2 weeks if symptoms not resolving. Imaging if anything worsening.

## 2015-06-07 NOTE — Assessment & Plan Note (Signed)
Depression screen negative. Health Maintenance reviewed -- Immunizations up-to-date. Preventive schedule discussed and handout given in AVS. Will obtain fasting labs today.  

## 2015-06-17 ENCOUNTER — Encounter: Payer: Self-pay | Admitting: Physician Assistant

## 2015-07-07 ENCOUNTER — Encounter: Payer: Self-pay | Admitting: Physician Assistant

## 2015-07-07 DIAGNOSIS — G8929 Other chronic pain: Secondary | ICD-10-CM

## 2015-07-07 DIAGNOSIS — R109 Unspecified abdominal pain: Principal | ICD-10-CM

## 2015-07-07 MED ORDER — DICYCLOMINE HCL 20 MG PO TABS: 20.0000 mg | ORAL_TABLET | Freq: Three times a day (TID) | ORAL | Status: DC

## 2015-07-14 ENCOUNTER — Other Ambulatory Visit: Payer: Self-pay | Admitting: Physician Assistant

## 2015-07-14 DIAGNOSIS — R109 Unspecified abdominal pain: Principal | ICD-10-CM

## 2015-07-14 DIAGNOSIS — G8929 Other chronic pain: Secondary | ICD-10-CM

## 2015-07-14 NOTE — Addendum Note (Signed)
Addended by: Marcelline Mates on: 07/14/2015 01:48 PM   Modules accepted: Orders

## 2015-07-18 ENCOUNTER — Ambulatory Visit (HOSPITAL_BASED_OUTPATIENT_CLINIC_OR_DEPARTMENT_OTHER)
Admission: RE | Admit: 2015-07-18 | Discharge: 2015-07-18 | Disposition: A | Discharge status: Home / Self Care | Payer: Commercial Managed Care - PPO | Source: Ambulatory Visit | Attending: Physician Assistant | Admitting: Physician Assistant

## 2015-07-18 ENCOUNTER — Encounter (HOSPITAL_BASED_OUTPATIENT_CLINIC_OR_DEPARTMENT_OTHER): Payer: Self-pay

## 2015-07-18 DIAGNOSIS — R109 Unspecified abdominal pain: Secondary | ICD-10-CM | POA: Diagnosis not present

## 2015-07-18 DIAGNOSIS — G8929 Other chronic pain: Secondary | ICD-10-CM

## 2015-07-18 DIAGNOSIS — R140 Abdominal distension (gaseous): Secondary | ICD-10-CM | POA: Diagnosis not present

## 2015-07-18 MED ORDER — IOHEXOL 300 MG/ML  SOLN
100.0000 mL | Freq: Once | INTRAMUSCULAR | Status: AC | PRN
  Administered 2015-07-18: 100 mL via INTRAVENOUS

## 2015-07-19 ENCOUNTER — Encounter: Payer: Self-pay | Admitting: Physician Assistant

## 2015-07-19 DIAGNOSIS — G8929 Other chronic pain: Secondary | ICD-10-CM

## 2015-07-19 DIAGNOSIS — R109 Unspecified abdominal pain: Principal | ICD-10-CM

## 2015-09-17 ENCOUNTER — Telehealth: Payer: Self-pay | Admitting: Physician Assistant

## 2015-09-17 NOTE — Telephone Encounter (Signed)
Called to schedule Flu Shot for patient. Patient has appointment on Friday 12/2 @ 4:00 and wants to get it then. No avail appointments on Nurse Schedule for Friday.

## 2015-09-17 NOTE — Telephone Encounter (Signed)
Noted, provider aware/SLS

## 2015-09-19 ENCOUNTER — Ambulatory Visit (INDEPENDENT_AMBULATORY_CARE_PROVIDER_SITE_OTHER): Payer: Commercial Managed Care - PPO | Admitting: Physician Assistant

## 2015-09-19 ENCOUNTER — Ambulatory Visit: Payer: Self-pay | Admitting: Physician Assistant

## 2015-09-19 ENCOUNTER — Ambulatory Visit (HOSPITAL_BASED_OUTPATIENT_CLINIC_OR_DEPARTMENT_OTHER)
Admission: RE | Admit: 2015-09-19 | Discharge: 2015-09-19 | Disposition: A | Discharge status: Home / Self Care | Payer: Commercial Managed Care - PPO | Source: Ambulatory Visit | Attending: Physician Assistant | Admitting: Physician Assistant

## 2015-09-19 ENCOUNTER — Encounter: Payer: Self-pay | Admitting: Physician Assistant

## 2015-09-19 VITALS — BP 115/67 | HR 65 | Temp 98.2°F | Resp 16 | Ht 74.0 in | Wt 185.1 lb

## 2015-09-19 DIAGNOSIS — G8929 Other chronic pain: Secondary | ICD-10-CM | POA: Diagnosis not present

## 2015-09-19 DIAGNOSIS — R1031 Right lower quadrant pain: Secondary | ICD-10-CM | POA: Diagnosis not present

## 2015-09-19 DIAGNOSIS — R1032 Left lower quadrant pain: Principal | ICD-10-CM

## 2015-09-19 LAB — COMPREHENSIVE METABOLIC PANEL
ALBUMIN: 4.4 g/dL (ref 3.5–5.2)
ALT: 29 U/L (ref 0–53)
AST: 18 U/L (ref 0–37)
Alkaline Phosphatase: 58 U/L (ref 39–117)
BUN: 17 mg/dL (ref 6–23)
CHLORIDE: 104 meq/L (ref 96–112)
CO2: 34 mEq/L — ABNORMAL HIGH (ref 19–32)
CREATININE: 0.84 mg/dL (ref 0.40–1.50)
Calcium: 10 mg/dL (ref 8.4–10.5)
GFR: 113.73 mL/min (ref >60.00)
GLUCOSE: 91 mg/dL (ref 70–99)
Potassium: 5.3 mEq/L — ABNORMAL HIGH (ref 3.5–5.1)
SODIUM: 141 meq/L (ref 135–145)
Total Bilirubin: 0.9 mg/dL (ref 0.2–1.2)
Total Protein: 7.3 g/dL (ref 6.0–8.3)

## 2015-09-19 LAB — POCT URINALYSIS DIPSTICK
BILIRUBIN UA: NEGATIVE
Blood, UA: NEGATIVE
GLUCOSE UA: NEGATIVE
Ketones, UA: NEGATIVE
Leukocytes, UA: NEGATIVE
Nitrite, UA: NEGATIVE
Protein, UA: NEGATIVE
Urobilinogen, UA: 0.2
pH, UA: 6

## 2015-09-19 LAB — CBC
HCT: 44.8 % (ref 39.0–52.0)
Hemoglobin: 15.3 g/dL (ref 13.0–17.0)
MCHC: 34 g/dL (ref 30.0–36.0)
MCV: 91.3 fl (ref 78.0–100.0)
Platelets: 198 10*3/uL (ref 150.0–400.0)
RBC: 4.91 Mil/uL (ref 4.22–5.81)
RDW: 12.9 % (ref 11.5–15.5)
WBC: 4.8 10*3/uL (ref 4.0–10.5)

## 2015-09-19 MED ORDER — HYOSCYAMINE SULFATE 0.125 MG PO TABS: 0.1250 mg | ORAL_TABLET | Freq: Four times a day (QID) | ORAL | Status: DC | PRN

## 2015-09-19 NOTE — Progress Notes (Signed)
Patient presents to clinic today c/o continued LLQ pain intermittent mainly but has been constant over the past week associated with constipation. Pain improves with bowel movements. Patient with chronic pain ongoing for the past year. Has had negative CT scan, EGD and colonoscopy. Denies history of abdominal surgery. Denies change to urinary habits. Has not taken anything for symptoms.  Past Medical History  Diagnosis Date  . Anxiety   . History of chicken pox   . Lymph node enlargement   . Colon polyp     Benign, colonoscopy 2015    Current Outpatient Prescriptions on File Prior to Visit  Medication Sig Dispense Refill  . escitalopram (LEXAPRO) 10 MG tablet Take 1 tablet (10 mg total) by mouth daily. 90 tablet 1   No current facility-administered medications on file prior to visit.    No Known Allergies  Family History  Problem Relation Age of Onset  . Thyroid disease Mother     Living  . Cirrhosis Father [id number]    Deceased  . Cancer Paternal Grandfather     Lung Cancer  . Cancer Maternal Grandfather   . Lymphoma Sister     Neck  . Skin cancer Sister     Social History   Social History  . Marital Status: Married    Spouse Name: N/A  . Number of Children: N/A  . Years of Education: N/A   Occupational History  . 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 Smoking status: Never Smoker   . Smokeless tobacco: Never Used  . Alcohol Use: 0.0 oz/week    0 Standard drinks or equivalent per week     Comment: rare  . Drug Use: No  . Sexual Activity:    Partners: Female   Other Topics Concern  . None   Social History Narrative   Review of Systems - See HPI.  All other ROS are negative.  BP 115/67 mmHg  Pulse 65  Temp(Src) 98.2 F (36.8 C) (Oral)  Resp 16  Ht 6\' 2"  (1.88 m)  Wt 185 lb 2 oz (83.972 kg)  BMI 23.76 kg/m2  SpO2 100%  Physical Exam  Constitutional: He is oriented to person, place, and time and well-developed, well-nourished, and in  no distress.  Cardiovascular: Normal rate, regular rhythm, normal heart sounds and intact distal pulses.   Pulmonary/Chest: Effort normal and breath sounds normal. No respiratory distress. He has no wheezes. He has no rales. He exhibits no tenderness.  Abdominal: Soft. Bowel sounds are normal. He exhibits no distension and no mass. There is no rebound and no guarding.  + LLQ tenderness without rebound or guarding. Palpable stool noted.  Neurological: He is alert and oriented to person, place, and time.  Skin: Skin is warm and dry. No rash noted.  Psychiatric: Affect normal.  Vitals reviewed.   Recent Results (from the past 2160 hour(s))  POCT urinalysis dipstick     Status: None   Collection Time: 09/19/15  9:16 AM  Result Value Ref Range   Color, UA yellow    Clarity, UA clear    Glucose, UA neg    Bilirubin, UA neg    Ketones, UA neg    Spec Grav, UA >=1.030    Blood, UA neg    pH, UA 6.0    Protein, UA neg    Urobilinogen, UA 0.2    Nitrite, UA neg    Leukocytes, UA Negative Negative    Assessment/Plan: Chronic LLQ  pain With exacerbation over the past week. Notes mild constipation. BS present. Palpable stool in LLQ. Recent CT negative for hernia or diverticula. Also recent negative (per patient) EGD and colonoscopy. Will check CBC and CMP today. Will obtain KUB to assess stool burden. Do not feel this is infection but secondary to constipation. Rx Levsin. Bowel regimen discussed. Will alter treatment based on results.

## 2015-09-19 NOTE — Patient Instructions (Signed)
Please go to the lab for blood work. Then go downstairs for x-ray. I will call with all results.  Stop the Bentyl and start the Levsin. Follow the bowel regimen below. I am concerned that stool is getting trapped in an area of your intestines where the intestines are turning.  I encourage you to increase hydration and the amount of fiber in your diet.  Start a daily probiotic (Align, Culturelle, Digestive Advantage, etc.). If no bowel movement within 24 hours, take 2 Tbs of Milk of Magnesia in a 4 oz glass of warmed prune juice every 2-3 days to help promote bowel movement. If no results within 24 hours, then repeat above regimen, adding a Dulcolax stool softener to regimen. If this does not promote a bowel movement, please call the office.

## 2015-09-19 NOTE — Assessment & Plan Note (Signed)
With exacerbation over the past week. Notes mild constipation. BS present. Palpable stool in LLQ. Recent CT negative for hernia or diverticula. Also recent negative (per patient) EGD and colonoscopy. Will check CBC and CMP today. Will obtain KUB to assess stool burden. Do not feel this is infection but secondary to constipation. Rx Levsin. Bowel regimen discussed. Will alter treatment based on results.

## 2015-09-19 NOTE — Progress Notes (Signed)
Pre visit review using our clinic review tool, if applicable. No additional management support is needed unless otherwise documented below in the visit note/SLS  

## 2015-09-29 ENCOUNTER — Telehealth: Payer: Self-pay | Admitting: Gastroenterology

## 2015-09-29 NOTE — Telephone Encounter (Signed)
Patient called and said for the past couple of months he has been having bad stomach pains and blood in his tool, he has went for scans and everything normal. Patient was asking to be seen as soon as possible, please call patient for more info and see if there is away to work him in somewhere in the next few weeks

## 2015-10-01 NOTE — Telephone Encounter (Signed)
Pt scheduled for an ov on Tuesday, [place]th in [place].

## 2015-10-06 ENCOUNTER — Other Ambulatory Visit: Payer: Self-pay

## 2015-10-07 ENCOUNTER — Ambulatory Visit: Payer: Self-pay | Admitting: Gastroenterology

## 2015-10-07 ENCOUNTER — Encounter: Payer: Self-pay | Admitting: Gastroenterology

## 2015-10-07 ENCOUNTER — Ambulatory Visit (INDEPENDENT_AMBULATORY_CARE_PROVIDER_SITE_OTHER): Payer: Commercial Managed Care - PPO | Admitting: Gastroenterology

## 2015-10-07 VITALS — BP 143/82 | HR 79 | Temp 99.0°F | Ht 74.0 in | Wt 188.0 lb

## 2015-10-07 DIAGNOSIS — K5909 Other constipation: Secondary | ICD-10-CM

## 2015-10-07 DIAGNOSIS — K625 Hemorrhage of anus and rectum: Secondary | ICD-10-CM

## 2015-10-07 NOTE — Progress Notes (Signed)
Primary Care Physician: Piedad ClimesMartin, William Cody, PA-C  Primary Gastroenterologist:  Dr. Midge Miniumarren Jaegar Croft  Chief Complaint  Patient presents with  . Abdominal Pain    HPI: Adam Bishop is a 29 y.o. male here for left lower quadrant abdominal pain. The patient states he has been constipated and reports that his medication he was given for intestinal spasms did not change any of his symptoms. The patient also reports that he has been having very hard stools despite taking a stool softener. The patient also reports pain while defecating with blood with bowel movements. The patient reports that the blood is bright red in color.  Current Outpatient Prescriptions  Medication Sig Dispense Refill  . hyoscyamine (LEVSIN, ANASPAZ) 0.125 MG tablet Take 1 tablet (0.125 mg total) by mouth every 6 (six) hours as needed. 120 tablet 0  . omeprazole (PRILOSEC) 40 MG capsule Take 40 mg by mouth daily.     No current facility-administered medications for this visit.    Allergies as of 10/07/2015  . (No Known Allergies)    ROS:  General: Negative for anorexia, weight loss, fever, chills, fatigue, weakness. ENT: Negative for hoarseness, difficulty swallowing , nasal congestion. CV: Negative for chest pain, angina, palpitations, dyspnea on exertion, peripheral edema.  Respiratory: Negative for dyspnea at rest, dyspnea on exertion, cough, sputum, wheezing.  GI: See history of present illness. GU:  Negative for dysuria, hematuria, urinary incontinence, urinary frequency, nocturnal urination.  Endo: Negative for unusual weight change.    Physical Examination:   BP 143/82 mmHg  Pulse 79  Temp(Src) 99 F (37.2 C) (Oral)  Ht 6\' 2"  (1.88 m)  Wt 188 lb (85.276 kg)  BMI 24.13 kg/m2  General: Well-nourished, well-developed in no acute distress.  Eyes: No icterus. Conjunctivae pink. Mouth: Oropharyngeal mucosa moist and pink , no lesions erythema or exudate. Lungs: Clear to auscultation bilaterally.  Non-labored. Heart: Regular rate and rhythm, no murmurs rubs or gallops.  Abdomen: Bowel sounds are normal, nondistended, no hepatosplenomegaly or masses, no abdominal bruits or hernia , no rebound or guarding.   Extremities: No lower extremity edema. No clubbing or deformities. Neuro: Alert and oriented x 3.  Grossly intact. Skin: Warm and dry, no jaundice.   Psych: Alert and cooperative, normal mood and affect.  Labs:    Imaging Studies: Dg Abd 1 View  09/19/2015  CLINICAL DATA:  Two months of left lower quadrant pain, assess stool burden. EXAM: ABDOMEN - 1 VIEW COMPARISON:  Abdominal pelvic CT scan of July 18, 2015 FINDINGS: The colonic stool burden is moderate. There is no small or large bowel obstructive pattern. There is no evidence of a fecal impaction. The soft tissues exhibit no abnormal calcifications. The bony structures are unremarkable. IMPRESSION: The stool burden is not excessive. No acute intra-abdominal or pelvic abnormality is demonstrated. Electronically Signed   By: David  SwazilandJordan M.D.   On: 09/19/2015 10:00    Assessment and Plan:   Adam RamusZachary C Bishop is a 29 y.o. y/o male who comes in with chronic constipation and a history of irritable bowel syndrome. The patient also has signs and symptoms of a anal fissure. The patient will be started on 290 mg of Linzess once a day. His abdominal exam showed findings consistent with muscular skeletal pain. The patient will take 2 Advil 3 times a day for muscle strain. The patient has been explained the plan and agrees with it.   Note: This dictation was prepared with Dragon dictation along with smaller phrase  technology. Any transcriptional errors that result from this process are unintentional.

## 2015-10-14 ENCOUNTER — Telehealth: Payer: Self-pay | Admitting: Gastroenterology

## 2015-10-14 NOTE — Telephone Encounter (Signed)
The Linzess samples you gave him are not working. He has followed directions. He is still having abdominal pain. Is Dr. Servando SnareWohl going to refer him out or what should he do?  Advil not helping.

## 2015-10-14 NOTE — Telephone Encounter (Signed)
Spoke with pt and advised him he should continue taking the Linzess daily. It has only been 7 days since he started the medication and he does need a little more time to get into his system and work. Advised also to increase fluids and reduce milk products during this time to see this will help him have a bowel movement. If no change in another week on Linzess, pt is to call back for is next step.

## 2015-10-16 ENCOUNTER — Encounter: Payer: Self-pay | Admitting: Physician Assistant

## 2015-10-16 ENCOUNTER — Other Ambulatory Visit: Payer: Self-pay | Admitting: Internal Medicine

## 2015-10-16 DIAGNOSIS — M7918 Myalgia, other site: Secondary | ICD-10-CM

## 2015-10-16 DIAGNOSIS — R1084 Generalized abdominal pain: Principal | ICD-10-CM

## 2015-10-16 MED ORDER — CYCLOBENZAPRINE HCL 10 MG PO TABS: 10.0000 mg | ORAL_TABLET | Freq: Every evening | ORAL | Status: DC | PRN

## 2015-10-17 ENCOUNTER — Other Ambulatory Visit: Payer: Self-pay | Admitting: Family Medicine

## 2015-10-17 NOTE — Telephone Encounter (Signed)
Ok to refer to sport med Try lyrica 50 mg bid for nerve pain

## 2015-10-21 MED ORDER — ESCITALOPRAM OXALATE 10 MG PO TABS: 10.0000 mg | ORAL_TABLET | Freq: Every day | ORAL | Status: DC

## 2015-10-21 NOTE — Telephone Encounter (Signed)
Referral signed by this provider as was pended but not addressed. MyChart message sent.

## 2015-10-23 ENCOUNTER — Ambulatory Visit (INDEPENDENT_AMBULATORY_CARE_PROVIDER_SITE_OTHER): Payer: Commercial Managed Care - PPO | Admitting: Family Medicine

## 2015-10-23 ENCOUNTER — Encounter: Payer: Self-pay | Admitting: Family Medicine

## 2015-10-23 VITALS — BP 164/93 | HR 94 | Ht 75.0 in | Wt 187.0 lb

## 2015-10-23 DIAGNOSIS — G8929 Other chronic pain: Secondary | ICD-10-CM | POA: Diagnosis not present

## 2015-10-23 DIAGNOSIS — R1032 Left lower quadrant pain: Secondary | ICD-10-CM

## 2015-10-23 DIAGNOSIS — R1031 Right lower quadrant pain: Secondary | ICD-10-CM

## 2015-10-23 DIAGNOSIS — S39011A Strain of muscle, fascia and tendon of abdomen, initial encounter: Secondary | ICD-10-CM | POA: Diagnosis not present

## 2015-10-23 MED ORDER — DICLOFENAC SODIUM 75 MG PO TBEC: 75.0000 mg | DELAYED_RELEASE_TABLET | Freq: Two times a day (BID) | ORAL | Status: DC

## 2015-10-23 NOTE — Patient Instructions (Signed)
Take voltaren 75mg  twice a day with food for 7-10 days then as needed. Start physical therapy for abdominal muscle strain and do home exercises on days you don't go to therapy. Consider an abdominal binder for support. Ice or heat, whichever feels better 15 minutes at a time 3-4 times a day. Follow up with me in 5-6 weeks.

## 2015-10-28 NOTE — Assessment & Plan Note (Signed)
Patient has had extensive workup and treatment to date including EGD, colonoscopy, CT abdomen and pelvis, bloodwork, KUB, bowel regimen, linzess, levsin and has noted only mild improvement.  While he has some abdominal tenderness if this were purely muscular I would expect him to feel completely better by now and he doesn't have a reason for abdominal wall strain.  A Spigelian hernia can present similarly but his CT is normal, there is nothing palpable in area of pain, he is not tender along arcuate line where these occur, and he has already seen general surgery who felt his pain was more muscular.  Advised him it is reasonable to at least try treatment for abdominal muscle strain though with voltaren and physical therapy.  Ice/heat.  F/u in 5-6 weeks for reevaluation.

## 2015-10-28 NOTE — Progress Notes (Signed)
PCP and consultation requested by: Piedad ClimesMartin, Adam Cody, PA-C  Subjective:   HPI: Patient is a 30 y.o. male here for abdominal pain.  Patient reports several months of lower left sided abdominal pain. He has had an extensive workup to date including abdominal/pelvic CT,  EGD, colonoscopy without a cause for his pain. Has previously had associated symptoms of heartburn, bloating, blood in stool Felt to have some constipation but his symptoms did not improve with a bowel regimen. Tried levsin, linzess without much improvement. Flexeril made him sleepy. He did not have any increase in activity or acute injury that he is aware of preceding his pain. Pain is dull and constant at 5/10 level left lower quadrant. No skin changes, fever.  Past Medical History  Diagnosis Date  . Anxiety   . History of chicken pox   . Lymph node enlargement   . Colon polyp     Benign, colonoscopy 2015    Current Outpatient Prescriptions on File Prior to Visit  Medication Sig Dispense Refill  . cyclobenzaprine (FLEXERIL) 10 MG tablet Take 1 tablet (10 mg total) by mouth at bedtime as needed for muscle spasms. 14 tablet 0  . escitalopram (LEXAPRO) 10 MG tablet Take 1 tablet (10 mg total) by mouth daily. 30 tablet 1  . hyoscyamine (LEVSIN, ANASPAZ) 0.125 MG tablet Take 1 tablet (0.125 mg total) by mouth every 6 (six) hours as needed. 120 tablet 0  . omeprazole (PRILOSEC) 40 MG capsule Take 40 mg by mouth daily.     No current facility-administered medications on file prior to visit.    Past Surgical History  Procedure Laterality Date  . Wisdom tooth extraction    . Tonsillectomy    . Finger surgery      fracture    No Known Allergies  Social History   Social History  . Marital Status: Married    Spouse Name: N/A  . Number of Children: N/A  . Years of Education: N/A   Occupational History  . 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 Smoking status: Never Smoker   . Smokeless  tobacco: Never Used  . Alcohol Use: 0.0 oz/week    0 Standard drinks or equivalent per week     Comment: rare  . Drug Use: No  . Sexual Activity:    Partners: Female   Other Topics Concern  . Not on file   Social History Narrative    Family History  Problem Relation Age of Onset  . Thyroid disease Mother     Living  . Cirrhosis Father [id number]    Deceased  . Cancer Paternal Grandfather     Lung Cancer  . Cancer Maternal Grandfather   . Lymphoma Sister     Neck  . Skin cancer Sister     BP 164/93 mmHg  Pulse 94  Ht 6\' 3"  (1.905 m)  Wt 187 lb (84.823 kg)  BMI 23.37 kg/m2  Review of Systems: See HPI above.    Objective:  Physical Exam:  Gen: NAD, comfortable.  Abd: soft, nondistended.  Tender over left rectus abdominus.  No hepatosplenomegaly.  No hernias palpated or visualized with valsalva.  No rebound or guarding.    Assessment & Plan:  1. Abdominal pain - Patient has had extensive workup and treatment to date including EGD, colonoscopy, CT abdomen and pelvis, bloodwork, KUB, bowel regimen, linzess, levsin and has noted only mild improvement.  While he has some abdominal tenderness if this were purely  muscular I would expect him to feel completely better by now and he doesn't have a reason for abdominal wall strain.  A Spigelian hernia can present similarly but his CT is normal, there is nothing palpable in area of pain, he is not tender along arcuate line where these occur, and he has already seen general surgery who felt his pain was more muscular.  Advised him it is reasonable to at least try treatment for abdominal muscle strain though with voltaren and physical therapy.  Ice/heat.  F/u in 5-6 weeks for reevaluation.

## 2015-10-29 ENCOUNTER — Other Ambulatory Visit: Payer: Self-pay

## 2015-10-29 DIAGNOSIS — K5909 Other constipation: Secondary | ICD-10-CM

## 2015-10-29 MED ORDER — LINACLOTIDE 290 MCG PO CAPS: 290.0000 ug | ORAL_CAPSULE | Freq: Every day | ORAL | Status: DC

## 2015-12-04 ENCOUNTER — Ambulatory Visit: Payer: Commercial Managed Care - PPO | Admitting: Family Medicine

## 2015-12-19 ENCOUNTER — Telehealth: Payer: Self-pay | Admitting: Physician Assistant

## 2015-12-19 NOTE — Telephone Encounter (Signed)
PT STATES HE WILL CALL NEXT WEEK AND SCHEDULE FLU SHOT

## 2016-01-26 ENCOUNTER — Other Ambulatory Visit: Payer: Self-pay | Admitting: Physician Assistant

## 2016-05-03 ENCOUNTER — Encounter: Payer: Commercial Managed Care - PPO | Admitting: Physician Assistant

## 2016-05-03 DIAGNOSIS — Z0289 Encounter for other administrative examinations: Secondary | ICD-10-CM

## 2016-05-31 ENCOUNTER — Other Ambulatory Visit: Payer: Self-pay | Admitting: Physician Assistant

## 2016-09-28 ENCOUNTER — Encounter: Payer: Self-pay | Admitting: Physician Assistant

## 2016-09-28 MED ORDER — ESCITALOPRAM OXALATE 10 MG PO TABS: 10.0000 mg | ORAL_TABLET | Freq: Every day | ORAL | 0 refills | Status: DC

## 2016-11-22 LAB — HM COLONOSCOPY

## 2017-02-07 ENCOUNTER — Telehealth: Payer: Self-pay | Admitting: Physician Assistant

## 2017-02-07 NOTE — Telephone Encounter (Signed)
Request to transfer care from [facility] to Dr. Carmelia Roller.

## 2017-02-07 NOTE — Telephone Encounter (Signed)
Ok with me 

## 2017-02-07 NOTE — Telephone Encounter (Signed)
OK w me.  

## 2017-04-07 ENCOUNTER — Ambulatory Visit (INDEPENDENT_AMBULATORY_CARE_PROVIDER_SITE_OTHER): Payer: Commercial Managed Care - PPO | Admitting: Physician Assistant

## 2017-04-07 ENCOUNTER — Encounter: Payer: Self-pay | Admitting: Physician Assistant

## 2017-04-07 ENCOUNTER — Other Ambulatory Visit (HOSPITAL_COMMUNITY)
Admission: RE | Admit: 2017-04-07 | Discharge: 2017-04-07 | Disposition: A | Discharge status: Home / Self Care | Payer: Commercial Managed Care - PPO | Source: Ambulatory Visit | Attending: Physician Assistant | Admitting: Physician Assistant

## 2017-04-07 VITALS — BP 120/76 | HR 67 | Temp 98.6°F | Resp 14 | Ht 74.5 in | Wt 177.0 lb

## 2017-04-07 DIAGNOSIS — N368 Other specified disorders of urethra: Secondary | ICD-10-CM | POA: Insufficient documentation

## 2017-04-07 LAB — POCT URINALYSIS DIPSTICK
Bilirubin, UA: NEGATIVE
GLUCOSE UA: NEGATIVE
Ketones, UA: NEGATIVE
Leukocytes, UA: NEGATIVE
NITRITE UA: NEGATIVE
Protein, UA: NEGATIVE
RBC UA: NEGATIVE
Spec Grav, UA: >=1.03 — AB (ref 1.010–1.025)
UROBILINOGEN UA: 0.2 U/dL
pH, UA: 5 (ref 5.0–8.0)

## 2017-04-07 NOTE — Progress Notes (Signed)
   Patient presents to clinic today c/o 1 week of intermittent pain in the distal penis, around the urethral meatus with occasional urinary urgency. Denies dysuria, nausea/vomiting, hematuria or foul-smelling urine. Denies fever, chills, malaise or fatigue. Denies penile discharge. Is sexually active with one male partner (wife). They have been monogamous for 12 years. Denies concern of STI. Endorses symptoms started after an episode of intercourse with his wife while in the shower. Noted symptoms started at the same time.   Past Medical History:  Diagnosis Date  . Anxiety   . Colon polyp    Benign, colonoscopy 2015  . History of chicken pox   . Lymph node enlargement     No current outpatient prescriptions on file prior to visit.   No current facility-administered medications on file prior to visit.     No Known Allergies  Family History  Problem Relation Age of Onset  . Thyroid disease Mother        Living  . Cirrhosis Father [id number]       Deceased  . Cancer Paternal Grandfather        Lung Cancer  . Cancer Maternal Grandfather   . Lymphoma Sister        Neck  . Skin cancer Sister     Social History   Social History  . Marital status: Married    Spouse name: N/A  . Number of children: N/A  . Years of education: N/A   Occupational History  . 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 Smoking status: Never Smoker  . Smokeless tobacco: Never Used  . Alcohol use 0.0 oz/week     Comment: rare  . Drug use: No  . Sexual activity: Yes    Partners: Female   Other Topics Concern  . None   Social History Narrative  . None   Review of Systems - See HPI.  All other ROS are negative.  BP 120/76   Pulse 67   Temp 98.6 F (37 C) (Oral)   Resp 14   Ht 6' 2.5" (1.892 m)   Wt 177 lb (80.3 kg)   SpO2 97%   BMI 22.42 kg/m   Physical Exam  Constitutional: He is oriented to person, place, and time and well-developed, well-nourished, and in no distress.    HENT:  Head: Normocephalic and atraumatic.  Eyes: Conjunctivae are normal.  Cardiovascular: Normal rate, regular rhythm, normal heart sounds and intact distal pulses.   Pulmonary/Chest: Effort normal. No respiratory distress. He has no wheezes. He has no rales. He exhibits no tenderness.  Genitourinary: Testes/scrotum normal. Penis exhibits no lesions and no edema. No discharge found.  Genitourinary Comments: Mild pain with palpation around meatus without erythema, lesion or sign of trauma.   Neurological: He is alert and oriented to person, place, and time.  Skin: Skin is warm and dry.  Vitals reviewed.  Assessment/Plan: 1. Urethral irritation Exam unremarkable. Occurred during intercourse with wife. Notes soap from shower getting in the meatus. Urine dip unremarkable. Will check urine ancillary for GC/CHl, yeast, trichomoniasis. Supportive measures reviewed. Will likely resolve on its own but will await testing results to verify there is nothing else contributing to symptoms.  - POCT Urinalysis Dipstick - Urine cytology ancillary only    Piedad ClimesMartin, Trimaine Maser Cody, PA-C

## 2017-04-07 NOTE — Progress Notes (Signed)
Pre visit review using our clinic review tool, if applicable. No additional management support is needed unless otherwise documented below in the visit note. 

## 2017-04-07 NOTE — Patient Instructions (Signed)
Please stay well-hydrated to flush out urinary tract. Start a cranberry supplement. Avoid intercourse or masturbation until this calms down. Always make sure to urinate before and after sexual activity.   We will call you with your results. Please let me know if you note any new or worsening symptoms.

## 2017-04-08 LAB — URINE CYTOLOGY ANCILLARY ONLY
CHLAMYDIA, DNA PROBE: NEGATIVE
Neisseria Gonorrhea: NEGATIVE
TRICH (WINDOWPATH): NEGATIVE

## 2017-04-11 LAB — URINE CYTOLOGY ANCILLARY ONLY: Candida vaginitis: NEGATIVE

## 2017-04-18 ENCOUNTER — Encounter: Payer: Self-pay | Admitting: Physician Assistant

## 2017-04-18 DIAGNOSIS — N50812 Left testicular pain: Secondary | ICD-10-CM

## 2017-04-19 ENCOUNTER — Encounter: Payer: Self-pay | Admitting: Physician Assistant

## 2017-04-19 ENCOUNTER — Ambulatory Visit (HOSPITAL_BASED_OUTPATIENT_CLINIC_OR_DEPARTMENT_OTHER)
Admission: RE | Admit: 2017-04-19 | Discharge: 2017-04-19 | Disposition: A | Discharge status: Home / Self Care | Payer: Commercial Managed Care - PPO | Source: Ambulatory Visit | Attending: Physician Assistant | Admitting: Physician Assistant

## 2017-04-19 ENCOUNTER — Other Ambulatory Visit: Payer: Self-pay | Admitting: Physician Assistant

## 2017-04-19 ENCOUNTER — Ambulatory Visit (INDEPENDENT_AMBULATORY_CARE_PROVIDER_SITE_OTHER): Payer: Commercial Managed Care - PPO | Admitting: Physician Assistant

## 2017-04-19 ENCOUNTER — Encounter (HOSPITAL_BASED_OUTPATIENT_CLINIC_OR_DEPARTMENT_OTHER): Payer: Self-pay

## 2017-04-19 VITALS — BP 116/76 | HR 70 | Temp 98.3°F | Resp 14 | Ht 74.5 in | Wt 177.0 lb

## 2017-04-19 DIAGNOSIS — N50819 Testicular pain, unspecified: Secondary | ICD-10-CM

## 2017-04-19 DIAGNOSIS — R1032 Left lower quadrant pain: Secondary | ICD-10-CM

## 2017-04-19 DIAGNOSIS — I861 Scrotal varices: Secondary | ICD-10-CM | POA: Diagnosis not present

## 2017-04-19 DIAGNOSIS — N451 Epididymitis: Secondary | ICD-10-CM

## 2017-04-19 LAB — POCT URINALYSIS DIPSTICK
BILIRUBIN UA: NEGATIVE
Blood, UA: NEGATIVE
GLUCOSE UA: NEGATIVE
KETONES UA: NEGATIVE
LEUKOCYTES UA: NEGATIVE
NITRITE UA: NEGATIVE
Spec Grav, UA: 1.02 (ref 1.010–1.025)
Urobilinogen, UA: 0.2 E.U./dL
pH, UA: 5 (ref 5.0–8.0)

## 2017-04-19 LAB — URINALYSIS, MICROSCOPIC ONLY
RBC / HPF: NONE SEEN (ref 0–?)
WBC, UA: NONE SEEN (ref 0–?)

## 2017-04-19 MED ORDER — LEVOFLOXACIN 500 MG PO TABS: 500.0000 mg | ORAL_TABLET | Freq: Every day | ORAL | 0 refills | Status: DC

## 2017-04-19 NOTE — Patient Instructions (Signed)
Please start medication as directed until urine culture has resulted. Let me know how symptoms are doing each day on medication -- via MyChart is fine.  Continue Ibuprofen and elevation of the testicles with supportive undergarments.   The other possibility for pain is a small hernia. I do not feel anything on examination today but recommend Ultrasound. You are declining today. If symptoms are not improving we will need to get this imaging.   Any worsening symptoms, please be seen at Urgent Care or ER.

## 2017-04-19 NOTE — Progress Notes (Signed)
Patient presents to clinic today c/o continued intermittent dysuria and now with left testicular discomfort and surrounding area. Urethral discomfort has all resolved since last visit. Patient denies hematuria, fever, chills, malaise or fatigue. Does lift heavy objections from time to time. Denies history of hernia. Denies bulging mass or pain with lifting.    Past Medical History:  Diagnosis Date  . Anxiety   . Colon polyp    Benign, colonoscopy 2015  . History of chicken pox   . Lymph node enlargement     No current outpatient prescriptions on file prior to visit.   No current facility-administered medications on file prior to visit.     No Known Allergies  Family History  Problem Relation Age of Onset  . Thyroid disease Mother        Living  . Cirrhosis Father [id number]       Deceased  . Cancer Paternal Grandfather        Lung Cancer  . Cancer Maternal Grandfather   . Lymphoma Sister        Neck  . Skin cancer Sister     Social History   Social History  . Marital status: Married    Spouse name: N/A  . Number of children: N/A  . Years of education: N/A   Occupational History  . 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 Smoking status: Never Smoker  . Smokeless tobacco: Never Used  . Alcohol use 0.0 oz/week     Comment: rare  . Drug use: No  . Sexual activity: Yes    Partners: Female   Other Topics Concern  . None   Social History Narrative  . None   Review of Systems - See HPI.  All other ROS are negative.  BP 116/76   Pulse 70   Temp 98.3 F (36.8 C) (Oral)   Resp 14   Ht 6' 2.5" (1.892 m)   Wt 177 lb (80.3 kg)   SpO2 98%   BMI 22.42 kg/m   Physical Exam  Constitutional: He is oriented to person, place, and time and well-developed, well-nourished, and in no distress.  HENT:  Head: Normocephalic and atraumatic.  Eyes: Conjunctivae are normal.  Neck: Neck supple.  Cardiovascular: Normal rate, regular rhythm, normal heart sounds and  intact distal pulses.   Pulmonary/Chest: Effort normal and breath sounds normal. No respiratory distress. He has no wheezes. He has no rales. He exhibits no tenderness.  Genitourinary: He exhibits testicular tenderness and epididymal tenderness. He exhibits no abnormal testicular mass, no abnormal scrotal mass and no scrotal tenderness. Cremasteric reflex is present.  Neurological: He is alert and oriented to person, place, and time.  Skin: Skin is warm and dry. No rash noted.  Psychiatric: Affect normal.  Vitals reviewed.  Recent Results (from the past 2160 hour(s))  Urine cytology ancillary only     Status: None   Collection Time: 04/07/17 12:00 AM  Result Value Ref Range   Chlamydia Negative     Comment: Normal Reference Range - Negative   Neisseria gonorrhea Negative     Comment: Normal Reference Range - Negative   Trichomonas Negative     Comment: Normal Reference Range - Negative  Urine cytology ancillary only     Status: None   Collection Time: 04/07/17 12:00 AM  Result Value Ref Range   Candida vaginitis Negative for Candida Vaginitis Microorganisms     Comment: Normal Reference Range - Negative  POCT Urinalysis  Dipstick     Status: Abnormal   Collection Time: 04/07/17 12:03 PM  Result Value Ref Range   Color, UA yellow    Clarity, UA clear    Glucose, UA negative    Bilirubin, UA negative    Ketones, UA negative    Spec Grav, UA >=1.030 (A) 1.010 - 1.025   Blood, UA negative    pH, UA 5.0 5.0 - 8.0   Protein, UA negative    Urobilinogen, UA 0.2 0.2 or 1.0 E.U./dL   Nitrite, UA negative    Leukocytes, UA Negative Negative   Assessment/Plan: 1. Epididymitis + tenderness of left epididymus on examination. Prior labs for UTI and Gc/chlamydia negative. Patient in monogamous relationship. Question infection versus inflammatory. No palpable hernia on examination. Continue NSAID and elevation of testicles. Will start 500 mg Levaquin QD for empiric treatment while awaiting  repeat cultures. Will closely follow symptoms for improvement with ABX. Discussed if no improvement needs [place]. Recommended he have done today but patient declines. ER precautions given.   - POCT Urinalysis Dipstick - levofloxacin (LEVAQUIN) 500 MG tablet; Take 1 tablet (500 mg total) by mouth daily.  Dispense: 7 tablet; Refill: 0 - Urine Microscopic Only - Urine Culture    Piedad Climes, PA-C

## 2017-04-19 NOTE — Progress Notes (Signed)
Pre visit review using our clinic review tool, if applicable. No additional management support is needed unless otherwise documented below in the visit note. 

## 2017-04-21 LAB — URINE CULTURE: Organism ID, Bacteria: NO GROWTH

## 2017-05-04 ENCOUNTER — Encounter: Payer: Self-pay | Admitting: Physician Assistant

## 2017-05-18 ENCOUNTER — Encounter: Payer: Self-pay | Admitting: Physician Assistant

## 2017-05-20 ENCOUNTER — Ambulatory Visit (INDEPENDENT_AMBULATORY_CARE_PROVIDER_SITE_OTHER): Payer: Commercial Managed Care - PPO | Admitting: Physician Assistant

## 2017-05-20 ENCOUNTER — Encounter: Payer: Self-pay | Admitting: Physician Assistant

## 2017-05-20 VITALS — BP 118/62 | HR 80 | Temp 97.9°F | Resp 16 | Ht 75.0 in | Wt 178.0 lb

## 2017-05-20 DIAGNOSIS — F411 Generalized anxiety disorder: Secondary | ICD-10-CM | POA: Diagnosis not present

## 2017-05-20 MED ORDER — PAROXETINE HCL 20 MG PO TABS: ORAL_TABLET | ORAL | 1 refills | Status: DC

## 2017-05-20 NOTE — Progress Notes (Signed)
Patient presents to clinic today for discussion of generalized anxiety. Endorses worrying constantly about "what ifs" or "what could be". Also notes irritability at work and at home. Noting significant stressors at work. Denies depressed mood but notes some occasional anhedonia. Denies SI/HI. Has noted recently grinding teeth at night over the past 1-2 weeks. Notes prior issue with this that was stress-related. Endorses doing well overall with sleep. No change to appetite. Denies SI/HI.  Past Medical History:  Diagnosis Date  . Anxiety   . Colon polyp    Benign, colonoscopy 2015  . History of chicken pox   . Lymph node enlargement     No current outpatient prescriptions on file prior to visit.   No current facility-administered medications on file prior to visit.     No Known Allergies  Family History  Problem Relation Age of Onset  . Thyroid disease Mother        Living  . Cirrhosis Father [id number]       Deceased  . Cancer Paternal Grandfather        Lung Cancer  . Cancer Maternal Grandfather   . Lymphoma Sister        Neck  . Skin cancer Sister     Social History   Social History  . Marital status: Married    Spouse name: N/A  . Number of children: N/A  . Years of education: N/A   Occupational History  . 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 Smoking status: Never Smoker  . Smokeless tobacco: Never Used  . Alcohol use 0.0 oz/week     Comment: rare  . Drug use: No  . Sexual activity: Yes    Partners: Female   Other Topics Concern  . None   Social History Narrative  . None   Review of Systems - See HPI.  All other ROS are negative.  BP 118/62   Pulse 80   Temp 97.9 F (36.6 C) (Oral)   Resp 16   Ht 6\' 3"  (1.905 m)   Wt 178 lb (80.7 kg)   SpO2 98%   BMI 22.25 kg/m   Physical Exam  Constitutional: He is oriented to person, place, and time and well-developed, well-nourished, and in no distress.  HENT:  Head: Normocephalic and  atraumatic.  Eyes: Conjunctivae are normal.  Neck: Neck supple.  Cardiovascular: Normal rate, regular rhythm, normal heart sounds and intact distal pulses.   Pulmonary/Chest: Effort normal and breath sounds normal. No respiratory distress. He has no wheezes. He has no rales. He exhibits no tenderness.  Neurological: He is alert and oriented to person, place, and time.  Skin: Skin is warm and dry. No rash noted.  Psychiatric: Affect normal.  Vitals reviewed.   Recent Results (from the past 2160 hour(s))  Urine cytology ancillary only     Status: None   Collection Time: 04/07/17 12:00 AM  Result Value Ref Range   Chlamydia Negative     Comment: Normal Reference Range - Negative   Neisseria gonorrhea Negative     Comment: Normal Reference Range - Negative   Trichomonas Negative     Comment: Normal Reference Range - Negative  Urine cytology ancillary only     Status: None   Collection Time: 04/07/17 12:00 AM  Result Value Ref Range   Candida vaginitis Negative for Candida Vaginitis Microorganisms     Comment: Normal Reference Range - Negative  POCT Urinalysis Dipstick     Status:  Abnormal   Collection Time: 04/07/17 12:03 PM  Result Value Ref Range   Color, UA yellow    Clarity, UA clear    Glucose, UA negative    Bilirubin, UA negative    Ketones, UA negative    Spec Grav, UA >=1.030 (A) 1.010 - 1.025   Blood, UA negative    pH, UA 5.0 5.0 - 8.0   Protein, UA negative    Urobilinogen, UA 0.2 0.2 or 1.0 E.U./dL   Nitrite, UA negative    Leukocytes, UA Negative Negative  POCT Urinalysis Dipstick     Status: Normal   Collection Time: 04/19/17 11:40 AM  Result Value Ref Range   Color, UA yellow    Clarity, UA clear    Glucose, UA negative    Bilirubin, UA negative    Ketones, UA negative    Spec Grav, UA 1.020 1.010 - 1.025   Blood, UA negative    pH, UA 5.0 5.0 - 8.0   Protein, UA trace    Urobilinogen, UA 0.2 0.2 or 1.0 E.U./dL   Nitrite, UA negative    Leukocytes, UA  Negative Negative  Urine Microscopic Only     Status: None   Collection Time: 04/19/17 11:42 AM  Result Value Ref Range   WBC, UA none seen 0-2/hpf   RBC / HPF none seen 0-2/hpf   Squamous Epithelial / LPF Rare(0-4/hpf) Rare(0-4/hpf)  Urine Culture     Status: None   Collection Time: 04/19/17 11:42 AM  Result Value Ref Range   Organism ID, Bacteria NO GROWTH    Assessment/Plan: No problem-specific Assessment & Plan notes found for this encounter.    Piedad ClimesMartin, Tanairi Cypert Cody, PA-C

## 2017-05-20 NOTE — Progress Notes (Signed)
Pre visit review using our clinic review tool, if applicable. No additional management support is needed unless otherwise documented below in the visit note. 

## 2017-05-20 NOTE — Patient Instructions (Signed)
Please start the paroxetine at 1/2 tablet daily for 1 week. Then increase to 1 tablet daily.  Keep hydrated and keep a well-balanced diet.  Make sure to follow the steps below to help with sleep:  Sleep Hygiene  Do: (1) Go to bed at the same time each day. (2) Get up from bed at the same time each day. (3) Get regular exercise each day, preferably in the morning.  There is goof evidence that regular exercise improves restful sleep.  This includes stretching and aerobic exercise. (4) Get regular exposure to outdoor or bright lights, especially in the late afternoon. (5) Keep the temperature in your bedroom comfortable. (6) Keep the bedroom quiet when sleeping. (7) Keep the bedroom dark enough to facilitate sleep. (8) Use your bed only for sleep and sex. (9) Take medications as directed.  It is helpful to take prescribed sleeping pills 1 hour before bedtime, so they are causing drowsiness when you lie down, or 10 hours before getting up, to avoid daytime drowsiness. (10) Use a relaxation exercise just before going to sleep -- imagery, massage, warm bath. (11) Keep your feet and hands warm.  Wear warm socks and/or mittens or gloves to bed.  Don't: (1) Exercise just before going to bed. (2) Engage in stimulating activity just before bed, such as playing a competitive game, watching an exciting program on television, or having an important discussion with a loved one. (3) Have caffeine in the evening (coffee, teas, chocolate, sodas, etc.) (4) Read or watch television in bed. (5) Use alcohol to help you sleep. (6) Go to bed too hungry or too full. (7) Take another person's sleeping pills. (8) Take over-the-counter sleeping pills, without your doctor's knowledge.  Tolerance can develop rapidly with these medications.  Diphenhydramine can have serious side effects for elderly patients. (9) Take daytime naps. (10) Command yourself to go to sleep.  This only makes your mind and body more  alert.  If you lie awake for more than 20-30 minutes, get up, go to a different room, participate in a quiet activity (Ex - non-excitable reading or television), and then return to bed when you feel sleepy.  Do this as many times during the night as needed.  This may cause you to have a night or two of poor sleep but it will train your brain to know when it is time for sleep.

## 2017-05-20 NOTE — Assessment & Plan Note (Signed)
Will start Paxil 10 mg x 1 week then increase to 20 mg daily.  Supportive measures reviewed. Follow-up 3-4 weeks.  Alarm signs/symptoms reviewed with patient who voiced understanding.

## 2017-05-24 ENCOUNTER — Ambulatory Visit: Payer: Commercial Managed Care - PPO

## 2017-06-10 ENCOUNTER — Ambulatory Visit: Payer: Commercial Managed Care - PPO | Admitting: Physician Assistant

## 2017-07-22 DIAGNOSIS — K219 Gastro-esophageal reflux disease without esophagitis: Secondary | ICD-10-CM | POA: Insufficient documentation

## 2017-09-14 ENCOUNTER — Ambulatory Visit: Payer: Commercial Managed Care - PPO | Admitting: Nurse Practitioner

## 2017-09-21 ENCOUNTER — Telehealth: Payer: Self-pay | Admitting: Gastroenterology

## 2017-09-21 NOTE — Telephone Encounter (Signed)
Patient would like for you to call him regarding acid reflux. EGD? Please call

## 2017-09-26 NOTE — Telephone Encounter (Signed)
Pt scheduled for an office appt to discuss reflux.

## 2017-09-29 ENCOUNTER — Ambulatory Visit (INDEPENDENT_AMBULATORY_CARE_PROVIDER_SITE_OTHER): Payer: No Typology Code available for payment source | Admitting: Gastroenterology

## 2017-09-29 ENCOUNTER — Encounter: Payer: Self-pay | Admitting: Gastroenterology

## 2017-09-29 VITALS — BP 129/69 | HR 86 | Ht 75.0 in | Wt 184.0 lb

## 2017-09-29 DIAGNOSIS — K219 Gastro-esophageal reflux disease without esophagitis: Secondary | ICD-10-CM

## 2017-09-29 NOTE — Progress Notes (Signed)
Primary Care Physician: Noel JourneyMartin, William C, PA-C  Primary Gastroenterologist:  Dr. Midge Miniumarren Kurt Azimi  No chief complaint on file.   HPI: Adam Bishop is a 31 y.o. male here for reflux. The patient has been seen in the past back in 2016 for constipation with rectal bleeding. The patient was started on Linzess at that time and was found to have symptoms consistent with an anal fissure. The patient had an EGD in 2016 that showed him to have mild chronic gastritis with normal biopsies the esophagus. The patient has recently been to ENT and was started again on his omeprazole 40 mg twice a day for a month. When that did not work he was switched to Nexium 40 mg twice a day. The patient still had symptoms with burning in his throat and a dry mouth in the morning. He states that he was then told to see a gastroenterologist. The patient also had a inspection of his pharynx and was told that he had signs of reflux. There is no report of any dysphagia but the patient is very concerned because his wife has HPV and he thinks he may have it and he was checking the Internet which is he reports said that he is at increased risk of throat cancer. He is also very concerned because he has a 32-year-old child at home. There is no report of any nausea vomiting black stools or bloody stools. He also denies any dysphagia or unexplained weight loss.  Current Outpatient Medications  Medication Sig Dispense Refill  . PARoxetine (PAXIL) 20 MG tablet Take 1/2 tablet daily x 1 week before increasing to 1 tablet daily 30 tablet 1   No current facility-administered medications for this visit.     Allergies as of 09/29/2017  . (No Known Allergies)    ROS:  General: Negative for anorexia, weight loss, fever, chills, fatigue, weakness. ENT: Negative for hoarseness, difficulty swallowing , nasal congestion. CV: Negative for chest pain, angina, palpitations, dyspnea on exertion, peripheral edema.  Respiratory: Negative for  dyspnea at rest, dyspnea on exertion, cough, sputum, wheezing.  GI: See history of present illness. GU:  Negative for dysuria, hematuria, urinary incontinence, urinary frequency, nocturnal urination.  Endo: Negative for unusual weight change.    Physical Examination:   There were no vitals taken for this visit.  General: Well-nourished, well-developed in no acute distress.  Eyes: No icterus. Conjunctivae pink. Mouth: Oropharyngeal mucosa moist and pink , no lesions erythema or exudate. Lungs: Clear to auscultation bilaterally. Non-labored. Heart: Regular rate and rhythm, no murmurs rubs or gallops.  Abdomen: Bowel sounds are normal, nontender, nondistended, no hepatosplenomegaly or masses, no abdominal bruits or hernia , no rebound or guarding.   Extremities: No lower extremity edema. No clubbing or deformities. Neuro: Alert and oriented x 3.  Grossly intact. Skin: Warm and dry, no jaundice.   Psych: Alert and cooperative, normal mood and affect.  Labs:    Imaging Studies: No results found.  Assessment and Plan:   Adam RamusZachary C Merced is a 31 y.o. y/o male who has burning in his throat and LPR reported by his ENT doctor. The patient will be started on Dexilant 60 mg once a day. The patient has been given samples for 3 weeks. He has been told that his symptoms do not improve he may need to undergo a 24-hour pH study with possible impedance. A repeat upper endoscopy will also be considered. The patient has been explained the plan and agrees with it.  Lucilla Lame, MD. Marval Regal   Note: This dictation was prepared with Dragon dictation along with smaller phrase technology. Any transcriptional errors that result from this process are unintentional.

## 2017-10-17 ENCOUNTER — Other Ambulatory Visit: Payer: Self-pay

## 2017-10-17 DIAGNOSIS — K2100 Gastro-esophageal reflux disease with esophagitis, without bleeding: Secondary | ICD-10-CM

## 2017-10-19 ENCOUNTER — Other Ambulatory Visit: Payer: Self-pay

## 2017-10-19 ENCOUNTER — Encounter: Payer: Self-pay | Admitting: *Deleted

## 2017-10-20 NOTE — Discharge Instructions (Signed)
General Anesthesia, Adult, Care After °These instructions provide you with information about caring for yourself after your procedure. Your health care provider may also give you more specific instructions. Your treatment has been planned according to current medical practices, but problems sometimes occur. Call your health care provider if you have any problems or questions after your procedure. °What can I expect after the procedure? °After the procedure, it is common to have: °· Vomiting. °· A sore throat. °· Mental slowness. ° °It is common to feel: °· Nauseous. °· Cold or shivery. °· Sleepy. °· Tired. °· Sore or achy, even in parts of your body where you did not have surgery. ° °Follow these instructions at home: °For at least 24 hours after the procedure: °· Do not: °? Participate in activities where you could fall or become injured. °? Drive. °? Use heavy machinery. °? Drink alcohol. °? Take sleeping pills or medicines that cause drowsiness. °? Make important decisions or sign legal documents. °? Take care of children on your own. °· Rest. °Eating and drinking °· If you vomit, drink water, juice, or soup when you can drink without vomiting. °· Drink enough fluid to keep your urine clear or pale yellow. °· Make sure you have little or no nausea before eating solid foods. °· Follow the diet recommended by your health care provider. °General instructions °· Have a responsible adult stay with you until you are awake and alert. °· Return to your normal activities as told by your health care provider. Ask your health care provider what activities are safe for you. °· Take over-the-counter and prescription medicines only as told by your health care provider. °· If you smoke, do not smoke without supervision. °· Keep all follow-up visits as told by your health care provider. This is important. °Contact a health care provider if: °· You continue to have nausea or vomiting at home, and medicines are not helpful. °· You  cannot drink fluids or start eating again. °· You cannot urinate after 8-12 hours. °· You develop a skin rash. °· You have fever. °· You have increasing redness at the site of your procedure. °Get help right away if: °· You have difficulty breathing. °· You have chest pain. °· You have unexpected bleeding. °· You feel that you are having a life-threatening or urgent problem. °This information is not intended to replace advice given to you by your health care provider. Make sure you discuss any questions you have with your health care provider. °Document Released: 01/10/2001 Document Revised: 03/08/2016 Document Reviewed: 09/18/2015 °[employer] Interactive Patient Education © 2018 [employer] ° °

## 2017-10-21 ENCOUNTER — Ambulatory Visit
Admission: RE | Admit: 2017-10-21 | Discharge: 2017-10-21 | Disposition: A | Discharge status: Home / Self Care | Payer: No Typology Code available for payment source | Source: Ambulatory Visit | Attending: Gastroenterology | Admitting: Gastroenterology

## 2017-10-21 ENCOUNTER — Ambulatory Visit: Payer: No Typology Code available for payment source | Admitting: Anesthesiology

## 2017-10-21 ENCOUNTER — Encounter
Admission: RE | Disposition: A | Discharge status: Home / Self Care | Payer: Self-pay | Source: Ambulatory Visit | Attending: Gastroenterology

## 2017-10-21 DIAGNOSIS — R12 Heartburn: Secondary | ICD-10-CM | POA: Diagnosis present

## 2017-10-21 DIAGNOSIS — F419 Anxiety disorder, unspecified: Secondary | ICD-10-CM | POA: Diagnosis not present

## 2017-10-21 DIAGNOSIS — K219 Gastro-esophageal reflux disease without esophagitis: Secondary | ICD-10-CM | POA: Diagnosis not present

## 2017-10-21 DIAGNOSIS — Z79899 Other long term (current) drug therapy: Secondary | ICD-10-CM | POA: Diagnosis not present

## 2017-10-21 DIAGNOSIS — K2100 Gastro-esophageal reflux disease with esophagitis, without bleeding: Secondary | ICD-10-CM

## 2017-10-21 DIAGNOSIS — F458 Other somatoform disorders: Secondary | ICD-10-CM

## 2017-10-21 HISTORY — DX: Presence of spectacles and contact lenses: Z97.3

## 2017-10-21 HISTORY — PX: ESOPHAGOGASTRODUODENOSCOPY (EGD) WITH PROPOFOL: SHX5813

## 2017-10-21 HISTORY — DX: Gastro-esophageal reflux disease without esophagitis: K21.9

## 2017-10-21 SURGERY — ESOPHAGOGASTRODUODENOSCOPY (EGD) WITH PROPOFOL
Anesthesia: General | Wound class: Clean Contaminated

## 2017-10-21 MED ORDER — PROPOFOL 10 MG/ML IV BOLUS
INTRAVENOUS | Status: DC | PRN
  Administered 2017-10-21 (×2): 100 mg via INTRAVENOUS

## 2017-10-21 MED ORDER — OXYCODONE HCL 5 MG PO TABS: 5.0000 mg | ORAL_TABLET | Freq: Once | ORAL | Status: DC | PRN

## 2017-10-21 MED ORDER — LACTATED RINGERS IV SOLN
INTRAVENOUS | Status: DC
  Administered 2017-10-21: 10:00:00 via INTRAVENOUS

## 2017-10-21 MED ORDER — SODIUM CHLORIDE 0.9 % IV SOLN: INTRAVENOUS | Status: DC

## 2017-10-21 MED ORDER — OXYCODONE HCL 5 MG/5ML PO SOLN: 5.0000 mg | Freq: Once | ORAL | Status: DC | PRN

## 2017-10-21 MED ORDER — GLYCOPYRROLATE 0.2 MG/ML IJ SOLN
INTRAMUSCULAR | Status: DC | PRN
  Administered 2017-10-21: 0.1 mg via INTRAVENOUS

## 2017-10-21 MED ORDER — LIDOCAINE HCL (CARDIAC) 20 MG/ML IV SOLN
INTRAVENOUS | Status: DC | PRN
  Administered 2017-10-21: 40 mg via INTRAVENOUS

## 2017-10-21 SURGICAL SUPPLY — 32 items
BALLN DILATOR 10-12 8 (BALLOONS)
BALLN DILATOR 12-15 8 (BALLOONS)
BALLN DILATOR 15-18 8 (BALLOONS)
BALLN DILATOR CRE 0-12 8 (BALLOONS)
BALLN DILATOR ESOPH 8 10 CRE (MISCELLANEOUS) IMPLANT
BALLOON DILATOR 12-15 8 (BALLOONS) IMPLANT
BALLOON DILATOR 15-18 8 (BALLOONS) IMPLANT
BALLOON DILATOR CRE 0-12 8 (BALLOONS) IMPLANT
BLOCK BITE 60FR ADLT L/F GRN (MISCELLANEOUS) ×3 IMPLANT
CANISTER SUCT 1200ML W/VALVE (MISCELLANEOUS) ×3 IMPLANT
CLIP HMST 235XBRD CATH ROT (MISCELLANEOUS) IMPLANT
CLIP RESOLUTION 360 11X235 (MISCELLANEOUS)
FCP ESCP3.2XJMB 240X2.8X (MISCELLANEOUS)
FORCEPS BIOP RAD 4 LRG CAP 4 (CUTTING FORCEPS) IMPLANT
FORCEPS BIOP RJ4 240 W/NDL (MISCELLANEOUS)
FORCEPS ESCP3.2XJMB 240X2.8X (MISCELLANEOUS) IMPLANT
GOWN CVR UNV OPN BCK APRN NK (MISCELLANEOUS) ×2 IMPLANT
GOWN ISOL THUMB LOOP REG UNIV (MISCELLANEOUS) ×6
INJECTOR VARIJECT VIN23 (MISCELLANEOUS) IMPLANT
KIT DEFENDO VALVE AND CONN (KITS) IMPLANT
KIT ENDO PROCEDURE OLY (KITS) ×3 IMPLANT
MARKER SPOT ENDO TATTOO 5ML (MISCELLANEOUS) IMPLANT
PAD GROUND ADULT SPLIT (MISCELLANEOUS) IMPLANT
RETRIEVER NET PLAT FOOD (MISCELLANEOUS) IMPLANT
SNARE SHORT THROW 13M SML OVAL (MISCELLANEOUS) IMPLANT
SNARE SHORT THROW 30M LRG OVAL (MISCELLANEOUS) IMPLANT
SPOT EX ENDOSCOPIC TATTOO (MISCELLANEOUS)
SYR INFLATION 60ML (SYRINGE) IMPLANT
TRAP ETRAP POLY (MISCELLANEOUS) IMPLANT
VARIJECT INJECTOR VIN23 (MISCELLANEOUS)
WATER STERILE IRR 250ML POUR (IV SOLUTION) ×3 IMPLANT
WIRE CRE 18-20MM 8CM F G (MISCELLANEOUS) IMPLANT

## 2017-10-21 NOTE — Op Note (Addendum)
[facility] Gastroenterology Patient Name: Adam Bishop Procedure Date: 10/21/2017 11:12 AM MRN: [id number] Account #: [id number] Date of Birth: 02-Jan-1986 Admit Type: Outpatient Age: 32 Room: [facility] OR ROOM 01 Gender: Male Note Status: Supervisor Override Procedure:            Upper GI endoscopy Indications:          Heartburn, Globus sensation Providers:            Midge Minium MD, MD Referring MD:         Noel Journey, PA (Referring MD) Medicines:            Propofol per Anesthesia Complications:        No immediate complications. Procedure:            Pre-Anesthesia Assessment:                       - Prior to the procedure, a History and Physical was                        performed, and patient medications and allergies were                        reviewed. The patient's tolerance of previous                        anesthesia was also reviewed. The risks and benefits of                        the procedure and the sedation options and risks were                        discussed with the patient. All questions were                        answered, and informed consent was obtained. Prior                        Anticoagulants: The patient has taken no previous                        anticoagulant or antiplatelet agents. ASA Grade                        Assessment: II - A patient with mild systemic disease.                        After reviewing the risks and benefits, the patient was                        deemed in satisfactory condition to undergo the                        procedure.                       After obtaining informed consent, the endoscope was                        passed under direct vision. Throughout the procedure,  the patient's blood pressure, pulse, and oxygen                        saturations were monitored continuously. The Olympus                        GIF-HQ190 Endoscope (S#. [id number]) was introduced                      through the mouth, and advanced to the second part of                        duodenum. The upper GI endoscopy was accomplished                        without difficulty. The patient tolerated the procedure                        well. Findings:      The esophagus was normal.      The stomach was normal.      The examined duodenum was normal. Impression:           - Normal esophagus.                       - Normal stomach.                       - Normal examined duodenum.                       - No specimens collected. Recommendation:       - Discharge patient to home.                       - Resume previous diet.                       - Continue present medications. 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 All rights reserved. The codes documented in this report are preliminary and upon coder review may  be revised to meet current compliance requirements. Midge Miniumarren Hannie Shoe MD, MD 10/21/2017 11:23:30 AM This report has been signed electronically. Number of Addenda: 0 Note Initiated On: 10/21/2017 11:12 AM      [facility]

## 2017-10-21 NOTE — Transfer of Care (Signed)
Immediate Anesthesia Transfer of Care Note  Patient: Dayna RamusZachary C Fahey  Procedure(s) Performed: ESOPHAGOGASTRODUODENOSCOPY (EGD) WITH PROPOFOL (N/A )  Patient Location: PACU  Anesthesia Type: General  Level of Consciousness: awake, alert  and patient cooperative  Airway and Oxygen Therapy: Patient Spontanous Breathing and Patient connected to supplemental oxygen  Post-op Assessment: Post-op Vital signs reviewed, Patient's Cardiovascular Status Stable, Respiratory Function Stable, Patent Airway and No signs of Nausea or vomiting  Post-op Vital Signs: Reviewed and stable  Complications: No apparent anesthesia complications

## 2017-10-21 NOTE — H&P (Signed)
Midge Minium, MD [facility] [address], [address] [place], [place] [address] Phone:[phone] Fax : [phone]  Primary Care Physician:  Noel Journey Primary Gastroenterologist:  Dr. Servando Snare  Pre-Procedure History & Physical: HPI:  Adam Bishop is a 32 y.o. male is here for an endoscopy.   Past Medical History:  Diagnosis Date  . Anxiety   . Colon polyp    Benign, colonoscopy 2015  . GERD (gastroesophageal reflux disease)   . History of chicken pox   . Lymph node enlargement   . Wears contact lenses     Past Surgical History:  Procedure Laterality Date  . COLONOSCOPY WITH ESOPHAGOGASTRODUODENOSCOPY (EGD)    . FINGER SURGERY     fracture  . TONSILLECTOMY    . WISDOM TOOTH EXTRACTION      Prior to Admission medications   Medication Sig Start Date End Date Taking? Authorizing Provider  Dexlansoprazole (DEXILANT PO) Take by mouth daily.   Yes [provider]  esomeprazole (NEXIUM) 40 MG capsule  09/16/17   [provider]  PARoxetine (PAXIL) 20 MG tablet Take 1/2 tablet daily x 1 week before increasing to 1 tablet daily Patient not taking: Reported on 09/29/2017 05/20/17   Waldon Merl, PA-C    Allergies as of 10/17/2017  . (No Known Allergies)    Family History  Problem Relation Age of Onset  . Thyroid disease Mother        Living  . Cirrhosis Father 17       Deceased  . Cancer Paternal Grandfather        Lung Cancer  . Cancer Maternal Grandfather   . Lymphoma Sister        Neck  . Skin cancer Sister     Social History   Socioeconomic History  . Marital status: Married    Spouse name: Not on file  . Number of children: Not on file  . Years of education: Not on file  . Highest education level: Not on file  Social Needs  . Financial resource strain: Not on file  . Food insecurity - worry: Not on file  . Food insecurity - inability: Not on file  . Transportation needs - medical: Not on file  . Transportation needs -  non-medical: Not on file  Occupational History  . Occupation: Gaffer  Tobacco Use  . Smoking status: Never Smoker  . Smokeless tobacco: Never Used  Substance and Sexual Activity  . Alcohol use: Yes    Alcohol/week: 0.0 oz    Comment: rare  . Drug use: No  . Sexual activity: Yes    Partners: Female  Other Topics Concern  . Not on file  Social History Narrative  . Not on file    Review of Systems: See HPI, otherwise negative ROS  Physical Exam: BP 120/75   Pulse 77   Temp 98.1 F (36.7 C) (Temporal)   Resp 16   Ht 6\' 3"  (1.905 m)   Wt 181 lb (82.1 kg)   SpO2 100%   BMI 22.62 kg/m  General:   Alert,  pleasant and cooperative in NAD Head:  Normocephalic and atraumatic. Neck:  Supple; no masses or thyromegaly. Lungs:  Clear throughout to auscultation.    Heart:  Regular rate and rhythm. Abdomen:  Soft, nontender and nondistended. Normal bowel sounds, without guarding, and without rebound.   Neurologic:  Alert and  oriented x4;  grossly normal neurologically.  Impression/Plan: Adam Bishop is here for  an endoscopy to be performed for gerd  Risks, benefits, limitations, and alternatives regarding  endoscopy have been reviewed with the patient.  Questions have been answered.  All parties agreeable.   [name], MD  10/21/2017, 10:38 AM

## 2017-10-21 NOTE — Anesthesia Preprocedure Evaluation (Signed)
Anesthesia Evaluation  Patient identified by MRN, date of birth, ID band  Reviewed: NPO status   History of Anesthesia Complications Negative for: history of anesthetic complications  Airway Mallampati: II  TM Distance: >3 FB Neck ROM: full    Dental no notable dental hx.    Pulmonary neg pulmonary ROS,    Pulmonary exam normal        Cardiovascular Exercise Tolerance: Good negative cardio ROS Normal cardiovascular exam     Neuro/Psych Anxiety negative neurological ROS     GI/Hepatic Neg liver ROS, GERD  Medicated,  Endo/Other  negative endocrine ROS  Renal/GU negative Renal ROS  negative genitourinary   Musculoskeletal   Abdominal   Peds  Hematology negative hematology ROS (+)   Anesthesia Other Findings tiva  Reproductive/Obstetrics                             Anesthesia Physical Anesthesia Plan  ASA: II  Anesthesia Plan: General   Post-op Pain Management:    Induction:   PONV Risk Score and Plan:   Airway Management Planned:   Additional Equipment:   Intra-op Plan:   Post-operative Plan:   Informed Consent: I have reviewed the patients History and Physical, chart, labs and discussed the procedure including the risks, benefits and alternatives for the proposed anesthesia with the patient or authorized representative who has indicated his/her understanding and acceptance.     Plan Discussed with: CRNA  Anesthesia Plan Comments:         Anesthesia Quick Evaluation

## 2017-10-21 NOTE — Anesthesia Postprocedure Evaluation (Signed)
Anesthesia Post Note  Patient: Adam Bishop  Procedure(s) Performed: ESOPHAGOGASTRODUODENOSCOPY (EGD) WITH PROPOFOL (N/A )  Patient location during evaluation: PACU Anesthesia Type: General Level of consciousness: awake and alert Pain management: pain level controlled Vital Signs Assessment: post-procedure vital signs reviewed and stable Respiratory status: spontaneous breathing, nonlabored ventilation, respiratory function stable and patient connected to nasal cannula oxygen Cardiovascular status: blood pressure returned to baseline and stable Postop Assessment: no apparent nausea or vomiting Anesthetic complications: no    Shareece Bultman

## 2017-10-21 NOTE — Anesthesia Procedure Notes (Signed)
Procedure Name: MAC Date/Time: 10/21/2017 11:13 AM Performed by: Janna Arch, CRNA Pre-anesthesia Checklist: Emergency Drugs available, Suction available, Patient being monitored and Patient identified Patient Re-evaluated:Patient Re-evaluated prior to induction Oxygen Delivery Method: Nasal cannula

## 2017-10-25 ENCOUNTER — Telehealth: Payer: Self-pay

## 2017-10-25 NOTE — Telephone Encounter (Signed)
With the patient know that I am not any neck expert and would not be in a position to recommend what tests should be done for his symptoms.

## 2017-10-25 NOTE — Telephone Encounter (Signed)
Pt called today stating he had his EGD last Friday and you had told his mother to have him to follow up back up with his ENT as there was nothing that showed up on the EGD. He was wanting to know if there was a scan you could order sooner as it will take weeks for him to get back in with them. He was wondering about a CT scan or US of his neck?? Please advise.

## 2017-10-26 ENCOUNTER — Encounter: Payer: Self-pay | Admitting: Gastroenterology

## 2017-10-26 NOTE — Telephone Encounter (Signed)
MyChart message was sent to pt with Dr. Annabell SabalWohl's response.

## 2017-10-29 ENCOUNTER — Other Ambulatory Visit: Payer: Self-pay | Admitting: Otolaryngology

## 2017-10-29 DIAGNOSIS — R09A2 Foreign body sensation, throat: Secondary | ICD-10-CM

## 2017-10-29 DIAGNOSIS — R0989 Other specified symptoms and signs involving the circulatory and respiratory systems: Secondary | ICD-10-CM

## 2017-11-02 ENCOUNTER — Other Ambulatory Visit: Payer: Self-pay | Admitting: Otolaryngology

## 2017-11-02 DIAGNOSIS — K2100 Gastro-esophageal reflux disease with esophagitis, without bleeding: Secondary | ICD-10-CM

## 2017-11-03 ENCOUNTER — Ambulatory Visit
Admission: RE | Admit: 2017-11-03 | Discharge: 2017-11-03 | Disposition: A | Discharge status: Home / Self Care | Payer: No Typology Code available for payment source | Source: Ambulatory Visit | Attending: Otolaryngology | Admitting: Otolaryngology

## 2017-11-03 DIAGNOSIS — K2100 Gastro-esophageal reflux disease with esophagitis, without bleeding: Secondary | ICD-10-CM

## 2017-11-03 MED ORDER — IOPAMIDOL (ISOVUE-300) INJECTION 61%
75.0000 mL | Freq: Once | INTRAVENOUS | Status: AC | PRN
  Administered 2017-11-03: 75 mL via INTRAVENOUS

## 2017-11-05 ENCOUNTER — Other Ambulatory Visit: Payer: Self-pay | Admitting: Otolaryngology

## 2017-11-05 DIAGNOSIS — K219 Gastro-esophageal reflux disease without esophagitis: Secondary | ICD-10-CM

## 2017-11-09 ENCOUNTER — Other Ambulatory Visit: Payer: Self-pay | Admitting: Otolaryngology

## 2017-11-09 DIAGNOSIS — E041 Nontoxic single thyroid nodule: Secondary | ICD-10-CM

## 2017-11-15 ENCOUNTER — Ambulatory Visit
Admission: RE | Admit: 2017-11-15 | Discharge: 2017-11-15 | Disposition: A | Discharge status: Home / Self Care | Payer: No Typology Code available for payment source | Source: Ambulatory Visit | Attending: Otolaryngology | Admitting: Otolaryngology

## 2017-11-15 DIAGNOSIS — E041 Nontoxic single thyroid nodule: Secondary | ICD-10-CM

## 2017-11-18 ENCOUNTER — Other Ambulatory Visit: Payer: No Typology Code available for payment source

## 2017-11-21 ENCOUNTER — Other Ambulatory Visit: Payer: No Typology Code available for payment source

## 2017-11-25 ENCOUNTER — Encounter: Payer: Self-pay | Admitting: Internal Medicine

## 2017-11-25 ENCOUNTER — Ambulatory Visit (INDEPENDENT_AMBULATORY_CARE_PROVIDER_SITE_OTHER): Payer: PRIVATE HEALTH INSURANCE | Admitting: Internal Medicine

## 2017-11-25 VITALS — BP 120/62 | HR 64 | Temp 97.6°F | Ht 75.0 in | Wt 185.0 lb

## 2017-11-25 DIAGNOSIS — E041 Nontoxic single thyroid nodule: Secondary | ICD-10-CM | POA: Insufficient documentation

## 2017-11-25 DIAGNOSIS — Z113 Encounter for screening for infections with a predominantly sexual mode of transmission: Secondary | ICD-10-CM

## 2017-11-25 DIAGNOSIS — F458 Other somatoform disorders: Secondary | ICD-10-CM | POA: Diagnosis not present

## 2017-11-25 DIAGNOSIS — R0989 Other specified symptoms and signs involving the circulatory and respiratory systems: Secondary | ICD-10-CM | POA: Insufficient documentation

## 2017-11-25 DIAGNOSIS — Z0000 Encounter for general adult medical examination without abnormal findings: Secondary | ICD-10-CM | POA: Diagnosis not present

## 2017-11-25 DIAGNOSIS — Z13220 Encounter for screening for lipoid disorders: Secondary | ICD-10-CM

## 2017-11-25 DIAGNOSIS — Z1159 Encounter for screening for other viral diseases: Secondary | ICD-10-CM

## 2017-11-25 DIAGNOSIS — F419 Anxiety disorder, unspecified: Secondary | ICD-10-CM

## 2017-11-25 DIAGNOSIS — F411 Generalized anxiety disorder: Secondary | ICD-10-CM | POA: Diagnosis not present

## 2017-11-25 NOTE — Patient Instructions (Addendum)
Consider mindfulness meditation/cognitive behavioral therapy in the future  sch labs w/in the next 2 weeks fasting no food only water x 12 hours F/u in 2-3 months  Take care   Generalized Anxiety Disorder, Adult Generalized anxiety disorder (GAD) is a mental health disorder. People with this condition constantly worry about everyday events. Unlike normal anxiety, worry related to GAD is not triggered by a specific event. These worries also do not fade or get better with time. GAD interferes with life functions, including relationships, work, and school. GAD can vary from mild to severe. People with severe GAD can have intense waves of anxiety with physical symptoms (panic attacks). What are the causes? The exact cause of GAD is not known. What increases the risk? This condition is more likely to develop in:  Women.  People who have a family history of anxiety disorders.  People who are very shy.  People who experience very stressful life events, such as the death of a loved one.  People who have a very stressful family environment.  What are the signs or symptoms? People with GAD often worry excessively about many things in their lives, such as their health and family. They may also be overly concerned about:  Doing well at work.  Being on time.  Natural disasters.  Friendships.  Physical symptoms of GAD include:  Fatigue.  Muscle tension or having muscle twitches.  Trembling or feeling shaky.  Being easily startled.  Feeling like your heart is pounding or racing.  Feeling out of breath or like you cannot take a deep breath.  Having trouble falling asleep or staying asleep.  Sweating.  Nausea, diarrhea, or irritable bowel syndrome (IBS).  Headaches.  Trouble concentrating or remembering facts.  Restlessness.  Irritability.  How is this diagnosed? Your health care provider can diagnose GAD based on your symptoms and medical history. You will also have a  physical exam. The health care provider will ask specific questions about your symptoms, including how severe they are, when they started, and if they come and go. Your health care provider may ask you about your use of alcohol or drugs, including prescription medicines. Your health care provider may refer you to a mental health specialist for further evaluation. Your health care provider will do a thorough examination and may perform additional tests to rule out other possible causes of your symptoms. To be diagnosed with GAD, a person must have anxiety that:  Is out of his or her control.  Affects several different aspects of his or her life, such as work and relationships.  Causes distress that makes him or her unable to take part in normal activities.  Includes at least three physical symptoms of GAD, such as restlessness, fatigue, trouble concentrating, irritability, muscle tension, or sleep problems.  Before your health care provider can confirm a diagnosis of GAD, these symptoms must be present more days than they are not, and they must last for six months or longer. How is this treated? The following therapies are usually used to treat GAD:  Medicine. Antidepressant medicine is usually prescribed for long-term daily control. Antianxiety medicines may be added in severe cases, especially when panic attacks occur.  Talk therapy (psychotherapy). Certain types of talk therapy can be helpful in treating GAD by providing support, education, and guidance. Options include: ? Cognitive behavioral therapy (CBT). People learn coping skills and techniques to ease their anxiety. They learn to identify unrealistic or negative thoughts and behaviors and to replace them with  positive ones. ? Acceptance and commitment therapy (ACT). This treatment teaches people how to be mindful as a way to cope with unwanted thoughts and feelings. ? Biofeedback. This process trains you to manage your body's response  (physiological response) through breathing techniques and relaxation methods. You will work with a therapist while machines are used to monitor your physical symptoms.  Stress management techniques. These include yoga, meditation, and exercise.  A mental health specialist can help determine which treatment is best for you. Some people see improvement with one type of therapy. However, other people require a combination of therapies. Follow these instructions at home:  Take over-the-counter and prescription medicines only as told by your health care provider.  Try to maintain a normal routine.  Try to anticipate stressful situations and allow extra time to manage them.  Practice any stress management or self-calming techniques as taught by your health care provider.  Do not punish yourself for setbacks or for not making progress.  Try to recognize your accomplishments, even if they are small.  Keep all follow-up visits as told by your health care provider. This is important. Contact a health care provider if:  Your symptoms do not get better.  Your symptoms get worse.  You have signs of depression, such as: ? A persistently sad, cranky, or irritable mood. ? Loss of enjoyment in activities that used to bring you joy. ? Change in weight or eating. ? Changes in sleeping habits. ? Avoiding friends or family members. ? Loss of energy for normal tasks. ? Feelings of guilt or worthlessness. Get help right away if:  You have serious thoughts about hurting yourself or others. If you ever feel like you may hurt yourself or others, or have thoughts about taking your own life, get help right away. You can go to your nearest emergency department or call:  Your local emergency services (911 in the [place]  A suicide crisis helpline, such as the National Suicide Prevention Lifeline at 878-241-75701-850 686 6849. This is open 24 hours a day.  Summary  Generalized anxiety disorder (GAD) is a mental  health disorder that involves worry that is not triggered by a specific event.  People with GAD often worry excessively about many things in their lives, such as their health and family.  GAD may cause physical symptoms such as restlessness, trouble concentrating, sleep problems, frequent sweating, nausea, diarrhea, headaches, and trembling or muscle twitching.  A mental health specialist can help determine which treatment is best for you. Some people see improvement with one type of therapy. However, other people require a combination of therapies. This information is not intended to replace advice given to you by your health care provider. Make sure you discuss any questions you have with your health care provider. Document Released: 01/29/2013 Document Revised: 08/24/2016 Document Reviewed: 08/24/2016 Elsevier Interactive Patient Education  [employer]2018 Elsevier Inc.

## 2017-11-25 NOTE — Progress Notes (Signed)
Pre visit review using our clinic review tool, if applicable. No additional management support is needed unless otherwise documented below in the visit note. 

## 2017-11-25 NOTE — Progress Notes (Signed)
Chief Complaint  Patient presents with  . Follow-up    transfer from [facility]    Establish care  1. He c/o hypochrondria, increased anxiety about health which may cause globus sensation in throat. He is no longer on Dexilant and recent EGD 2019 with Dr. Servando Snare neg and neg Hpylori. He feels like  Lump is in his throat daily and notices with anxiety.  Denies dysphagia for food or liq. He has had 2 EGDs with Dr. Servando Snare. Work is stressful  2. H/o thyroid nodules since 2012 thyroid US +nodules none significant for bx for f/u  3. Anxiety seeing therapist in [place] who referred him to [facility] for further testing appt pending. He reports high stress at work He stopped Paxil 4-5 months ago. He tries CBD oil to help  4. Agreeable to STD check he reports wife +HPV in the past.     Review of Systems  Constitutional: Negative for weight loss.  HENT:       +globus sensation  Neg dysphagia   Eyes: Negative for blurred vision.  Respiratory: Negative for shortness of breath.   Cardiovascular: Negative for chest pain.  Gastrointestinal: Negative for abdominal pain.  Musculoskeletal: Negative for falls.  Skin: Negative for rash.  Neurological: Negative for headaches.  Psychiatric/Behavioral: The patient is nervous/anxious.        +stress    Past Medical History:  Diagnosis Date  . Anxiety   . Colon polyp    Benign, colonoscopy 2015  . GERD (gastroesophageal reflux disease)   . Globus sensation   . History of chicken pox   . Lymph node enlargement   . Wears contact lenses    Past Surgical History:  Procedure Laterality Date  . COLONOSCOPY WITH ESOPHAGOGASTRODUODENOSCOPY (EGD)    . ESOPHAGOGASTRODUODENOSCOPY (EGD) WITH PROPOFOL N/A 10/21/2017   Procedure: ESOPHAGOGASTRODUODENOSCOPY (EGD) WITH PROPOFOL;  Surgeon: Midge Minium, MD;  [facility];  Service: Endoscopy;  Laterality: N/A;  . FINGER SURGERY     fracture  . TONSILLECTOMY    . WISDOM TOOTH EXTRACTION     Family History   Problem Relation Age of Onset  . Thyroid disease Mother        Living  . Goiter Mother   . Cirrhosis Father 83       Deceased  . Alcohol abuse Father        liver cirrhosis   . Cancer Paternal Grandfather        Lung Cancer  . Cancer Maternal Grandfather   . Lymphoma Sister        Neck  . Melanoma Sister   . Skin cancer Sister    Social History   Socioeconomic History  . Marital status: Married    Spouse name: Not on file  . Number of children: Not on file  . Years of education: Not on file  . Highest education level: Not on file  Social Needs  . Financial resource strain: Not on file  . Food insecurity - worry: Not on file  . Food insecurity - inability: Not on file  . Transportation needs - medical: Not on file  . Transportation needs - non-medical: Not on file  Occupational History  . Occupation: Gaffer  Tobacco Use  . Smoking status: Never Smoker  . Smokeless tobacco: Never Used  Substance and Sexual Activity  . Alcohol use: Yes    Alcohol/week: 0.0 oz    Comment: rare  . Drug use: No  . Sexual activity: Yes  Partners: Female  Other Topics Concern  . Not on file  Social History Narrative   Married    1 son    Works Glass blower/designercar painter [employer]   No outpatient medications have been marked as taking for the 11/25/17 encounter (Office Visit) with McLean-Scocuzza, Pasty Spillersracy N, MD.   No Known Allergies No results found for this or any previous visit (from the past 2160 hour(s)). Objective  Body mass index is 23.12 kg/m. Wt Readings from Last 3 Encounters:  11/25/17 185 lb (83.9 kg)  10/21/17 181 lb (82.1 kg)  09/29/17 184 lb (83.5 kg)   Temp Readings from Last 3 Encounters:  11/25/17 97.6 F (36.4 C) (Oral)  10/21/17 97.6 F (36.4 C)  05/20/17 97.9 F (36.6 C) (Oral)   BP Readings from Last 3 Encounters:  11/25/17 120/62  10/21/17 125/74  09/29/17 129/69   Pulse Readings from Last 3 Encounters:  11/25/17 64  10/21/17 61  09/29/17 86    O2 sat room air 98%   Physical Exam  Constitutional: He is oriented to person, place, and time and well-developed, well-nourished, and in no distress. Vital signs are normal.  HENT:  Head: Normocephalic and atraumatic.  Mouth/Throat: Oropharynx is clear and moist and mucous membranes are normal.  Eyes: Conjunctivae are normal. Pupils are equal, round, and reactive to light.  Cardiovascular: Normal rate, regular rhythm and normal heart sounds.  Pulmonary/Chest: Effort normal and breath sounds normal.  Neurological: He is alert and oriented to person, place, and time. Gait normal. Gait normal.  Skin: Skin is warm, dry and intact.  Psychiatric: Mood, memory, affect and judgment normal.  Nursing note and vitals reviewed.   Assessment   1. Anxiety 2. Globus sensation r/o esophageal spasm no GERD Hpylori on EGD 3. HM  Plan  1.  Pending further w/u with therapy pending disc meditation and mindfullness   2. Consider Barium swallow r/o esophageal spasm to r/o will disc with GI Dr. Servando SnareWohl  3. Declines flu shot  Tdap had  Never had HPV shot  Check hep B status   Saw derm 2-3 years ago  Labs CMET, CBC, TSH, T4, T3, Ua, lipid, STD check had other STDs neg 03/2017   Had EGD x 2 and colonoscopies x 2 in the past Dr. Earleen NewportWohl  Sees Dr. Jenne PaneBates ENT Provider: Dr. French Anaracy McLean-Scocuzza-[facility]

## 2017-12-07 ENCOUNTER — Encounter: Payer: Self-pay | Admitting: Internal Medicine

## 2017-12-07 ENCOUNTER — Other Ambulatory Visit (INDEPENDENT_AMBULATORY_CARE_PROVIDER_SITE_OTHER): Payer: No Typology Code available for payment source

## 2017-12-07 DIAGNOSIS — Z113 Encounter for screening for infections with a predominantly sexual mode of transmission: Secondary | ICD-10-CM | POA: Diagnosis not present

## 2017-12-07 DIAGNOSIS — Z13220 Encounter for screening for lipoid disorders: Secondary | ICD-10-CM | POA: Diagnosis not present

## 2017-12-07 DIAGNOSIS — E041 Nontoxic single thyroid nodule: Secondary | ICD-10-CM

## 2017-12-07 DIAGNOSIS — Z1159 Encounter for screening for other viral diseases: Secondary | ICD-10-CM

## 2017-12-07 DIAGNOSIS — F419 Anxiety disorder, unspecified: Secondary | ICD-10-CM | POA: Diagnosis not present

## 2017-12-07 DIAGNOSIS — Z0000 Encounter for general adult medical examination without abnormal findings: Secondary | ICD-10-CM | POA: Diagnosis not present

## 2017-12-07 LAB — CBC WITH DIFFERENTIAL/PLATELET
BASOS ABS: 0 10*3/uL (ref 0.0–0.1)
Basophils Relative: 0.2 % (ref 0.0–3.0)
EOS ABS: 0 10*3/uL (ref 0.0–0.7)
EOS PCT: 1.1 % (ref 0.0–5.0)
HCT: 47.2 % (ref 39.0–52.0)
HEMOGLOBIN: 16.2 g/dL (ref 13.0–17.0)
LYMPHS ABS: 1.7 10*3/uL (ref 0.7–4.0)
Lymphocytes Relative: 40.4 % (ref 12.0–46.0)
MCHC: 34.3 g/dL (ref 30.0–36.0)
MCV: 90.7 fl (ref 78.0–100.0)
MONO ABS: 0.4 10*3/uL (ref 0.1–1.0)
Monocytes Relative: 8.8 % (ref 3.0–12.0)
NEUTROS PCT: 49.5 % (ref 43.0–77.0)
Neutro Abs: 2.1 10*3/uL (ref 1.4–7.7)
Platelets: 210 10*3/uL (ref 150.0–400.0)
RBC: 5.21 Mil/uL (ref 4.22–5.81)
RDW: 12.9 % (ref 11.5–15.5)
WBC: 4.3 10*3/uL (ref 4.0–10.5)

## 2017-12-07 LAB — COMPREHENSIVE METABOLIC PANEL
ALBUMIN: 4.6 g/dL (ref 3.5–5.2)
ALK PHOS: 59 U/L (ref 39–117)
ALT: 22 U/L (ref 0–53)
AST: 19 U/L (ref 0–37)
BILIRUBIN TOTAL: 1.1 mg/dL (ref 0.2–1.2)
BUN: 15 mg/dL (ref 6–23)
CO2: 33 mEq/L — ABNORMAL HIGH (ref 19–32)
Calcium: 9.9 mg/dL (ref 8.4–10.5)
Chloride: 102 mEq/L (ref 96–112)
Creatinine, Ser: 0.84 mg/dL (ref 0.40–1.50)
GFR: 112.12 mL/min (ref >60.00)
GLUCOSE: 89 mg/dL (ref 70–99)
POTASSIUM: 4.3 meq/L (ref 3.5–5.1)
SODIUM: 141 meq/L (ref 135–145)
TOTAL PROTEIN: 7.4 g/dL (ref 6.0–8.3)

## 2017-12-07 LAB — URINALYSIS, ROUTINE W REFLEX MICROSCOPIC
BILIRUBIN URINE: NEGATIVE
Hgb urine dipstick: NEGATIVE
KETONES UR: NEGATIVE
LEUKOCYTES UA: NEGATIVE
Nitrite: NEGATIVE
PH: 6 (ref 5.0–8.0)
RBC / HPF: NONE SEEN (ref 0–?)
Specific Gravity, Urine: >=1.03 — AB (ref 1.000–1.030)
Total Protein, Urine: NEGATIVE
UROBILINOGEN UA: 0.2 (ref 0.0–1.0)
Urine Glucose: NEGATIVE

## 2017-12-07 LAB — LIPID PANEL
CHOL/HDL RATIO: 4
Cholesterol: 193 mg/dL (ref 0–200)
HDL: 44.5 mg/dL (ref >39.00)
LDL Cholesterol: 139 mg/dL — ABNORMAL HIGH (ref 0–99)
NONHDL: 148.02
TRIGLYCERIDES: 46 mg/dL (ref 0.0–149.0)
VLDL: 9.2 mg/dL (ref 0.0–40.0)

## 2017-12-07 LAB — TSH: TSH: 1.19 u[IU]/mL (ref 0.35–4.50)

## 2017-12-07 LAB — T3, FREE: T3, Free: 3.7 pg/mL (ref 2.3–4.2)

## 2017-12-07 LAB — T4, FREE: Free T4: 0.93 ng/dL (ref 0.60–1.60)

## 2017-12-08 LAB — HEPATITIS C ANTIBODY
HEP C AB: NONREACTIVE
SIGNAL TO CUT-OFF: 0.02 (ref <1.00)

## 2017-12-08 LAB — HEPATITIS B SURFACE ANTIGEN: Hepatitis B Surface Ag: NONREACTIVE

## 2017-12-08 LAB — HSV 1 ANTIBODY, IGG: HSV 1 Glycoprotein G Ab, IgG: 2.44 index — ABNORMAL HIGH

## 2017-12-08 LAB — HEPATITIS B SURFACE ANTIBODY, QUANTITATIVE: HEPATITIS B-POST: 203 m[IU]/mL (ref >=10)

## 2017-12-08 LAB — HSV 2 ANTIBODY, IGG: HSV 2 Glycoprotein G Ab, IgG: <0.9 index

## 2017-12-08 LAB — HIV ANTIBODY (ROUTINE TESTING W REFLEX): HIV 1&2 Ab, 4th Generation: NONREACTIVE

## 2017-12-08 LAB — RPR: RPR: NONREACTIVE

## 2018-01-05 ENCOUNTER — Encounter: Payer: Self-pay | Admitting: Internal Medicine

## 2018-01-09 ENCOUNTER — Encounter: Payer: Self-pay | Admitting: Internal Medicine

## 2018-01-11 ENCOUNTER — Other Ambulatory Visit: Payer: Self-pay | Admitting: Internal Medicine

## 2018-01-11 DIAGNOSIS — F419 Anxiety disorder, unspecified: Secondary | ICD-10-CM

## 2018-01-11 MED ORDER — SERTRALINE HCL 50 MG PO TABS: 50.0000 mg | ORAL_TABLET | Freq: Every day | ORAL | 2 refills | Status: DC

## 2018-01-23 ENCOUNTER — Encounter: Payer: Self-pay | Admitting: Internal Medicine

## 2018-01-29 ENCOUNTER — Other Ambulatory Visit: Payer: Self-pay | Admitting: Internal Medicine

## 2018-01-29 DIAGNOSIS — J40 Bronchitis, not specified as acute or chronic: Secondary | ICD-10-CM

## 2018-02-07 ENCOUNTER — Encounter: Payer: Self-pay | Admitting: Internal Medicine

## 2018-02-07 ENCOUNTER — Other Ambulatory Visit: Payer: Self-pay | Admitting: Internal Medicine

## 2018-02-07 DIAGNOSIS — R1310 Dysphagia, unspecified: Secondary | ICD-10-CM

## 2018-02-08 ENCOUNTER — Encounter (INDEPENDENT_AMBULATORY_CARE_PROVIDER_SITE_OTHER): Payer: Self-pay

## 2018-02-10 ENCOUNTER — Ambulatory Visit: Admission: RE | Admit: 2018-02-10 | Payer: No Typology Code available for payment source | Source: Ambulatory Visit

## 2018-02-10 ENCOUNTER — Ambulatory Visit: Payer: No Typology Code available for payment source

## 2018-02-13 ENCOUNTER — Ambulatory Visit
Admission: RE | Admit: 2018-02-13 | Discharge: 2018-02-13 | Disposition: A | Discharge status: Home / Self Care | Payer: PRIVATE HEALTH INSURANCE | Source: Ambulatory Visit | Attending: Internal Medicine | Admitting: Internal Medicine

## 2018-02-13 DIAGNOSIS — R1310 Dysphagia, unspecified: Secondary | ICD-10-CM | POA: Insufficient documentation

## 2018-02-22 ENCOUNTER — Encounter: Payer: Self-pay | Admitting: Internal Medicine

## 2018-02-22 ENCOUNTER — Ambulatory Visit (INDEPENDENT_AMBULATORY_CARE_PROVIDER_SITE_OTHER): Payer: PRIVATE HEALTH INSURANCE | Admitting: Internal Medicine

## 2018-02-22 VITALS — BP 126/70 | HR 59 | Temp 97.5°F | Ht 75.0 in | Wt 184.8 lb

## 2018-02-22 DIAGNOSIS — K219 Gastro-esophageal reflux disease without esophagitis: Secondary | ICD-10-CM

## 2018-02-22 DIAGNOSIS — R0789 Other chest pain: Secondary | ICD-10-CM | POA: Diagnosis not present

## 2018-02-22 DIAGNOSIS — F411 Generalized anxiety disorder: Secondary | ICD-10-CM

## 2018-02-22 DIAGNOSIS — F419 Anxiety disorder, unspecified: Secondary | ICD-10-CM | POA: Diagnosis not present

## 2018-02-22 MED ORDER — PANTOPRAZOLE SODIUM 40 MG PO TBEC: 40.0000 mg | DELAYED_RELEASE_TABLET | Freq: Every day | ORAL | 0 refills | Status: DC

## 2018-02-22 MED ORDER — SERTRALINE HCL 50 MG PO TABS: 75.0000 mg | ORAL_TABLET | Freq: Every day | ORAL | 0 refills | Status: DC

## 2018-02-22 NOTE — Progress Notes (Signed)
Pre visit review using our clinic review tool, if applicable. No additional management support is needed unless otherwise documented below in the visit note. 

## 2018-02-22 NOTE — Patient Instructions (Signed)
Please increase zoloft to 75 mg daily 1.5 daily  Let me know about the nose spray  Try protonix   Generalized Anxiety Disorder, Adult Generalized anxiety disorder (GAD) is a mental health disorder. People with this condition constantly worry about everyday events. Unlike normal anxiety, worry related to GAD is not triggered by a specific event. These worries also do not fade or get better with time. GAD interferes with life functions, including relationships, work, and school. GAD can vary from mild to severe. People with severe GAD can have intense waves of anxiety with physical symptoms (panic attacks). What are the causes? The exact cause of GAD is not known. What increases the risk? This condition is more likely to develop in:  Women.  People who have a family history of anxiety disorders.  People who are very shy.  People who experience very stressful life events, such as the death of a loved one.  People who have a very stressful family environment.  What are the signs or symptoms? People with GAD often worry excessively about many things in their lives, such as their health and family. They may also be overly concerned about:  Doing well at work.  Being on time.  Natural disasters.  Friendships.  Physical symptoms of GAD include:  Fatigue.  Muscle tension or having muscle twitches.  Trembling or feeling shaky.  Being easily startled.  Feeling like your heart is pounding or racing.  Feeling out of breath or like you cannot take a deep breath.  Having trouble falling asleep or staying asleep.  Sweating.  Nausea, diarrhea, or irritable bowel syndrome (IBS).  Headaches.  Trouble concentrating or remembering facts.  Restlessness.  Irritability.  How is this diagnosed? Your health care provider can diagnose GAD based on your symptoms and medical history. You will also have a physical exam. The health care provider will ask specific questions about your  symptoms, including how severe they are, when they started, and if they come and go. Your health care provider may ask you about your use of alcohol or drugs, including prescription medicines. Your health care provider may refer you to a mental health specialist for further evaluation. Your health care provider will do a thorough examination and may perform additional tests to rule out other possible causes of your symptoms. To be diagnosed with GAD, a person must have anxiety that:  Is out of his or her control.  Affects several different aspects of his or her life, such as work and relationships.  Causes distress that makes him or her unable to take part in normal activities.  Includes at least three physical symptoms of GAD, such as restlessness, fatigue, trouble concentrating, irritability, muscle tension, or sleep problems.  Before your health care provider can confirm a diagnosis of GAD, these symptoms must be present more days than they are not, and they must last for six months or longer. How is this treated? The following therapies are usually used to treat GAD:  Medicine. Antidepressant medicine is usually prescribed for long-term daily control. Antianxiety medicines may be added in severe cases, especially when panic attacks occur.  Talk therapy (psychotherapy). Certain types of talk therapy can be helpful in treating GAD by providing support, education, and guidance. Options include: ? Cognitive behavioral therapy (CBT). People learn coping skills and techniques to ease their anxiety. They learn to identify unrealistic or negative thoughts and behaviors and to replace them with positive ones. ? Acceptance and commitment therapy (ACT). This treatment teaches  people how to be mindful as a way to cope with unwanted thoughts and feelings. ? Biofeedback. This process trains you to manage your body's response (physiological response) through breathing techniques and relaxation methods. You  will work with a therapist while machines are used to monitor your physical symptoms.  Stress management techniques. These include yoga, meditation, and exercise.  A mental health specialist can help determine which treatment is best for you. Some people see improvement with one type of therapy. However, other people require a combination of therapies. Follow these instructions at home:  Take over-the-counter and prescription medicines only as told by your health care provider.  Try to maintain a normal routine.  Try to anticipate stressful situations and allow extra time to manage them.  Practice any stress management or self-calming techniques as taught by your health care provider.  Do not punish yourself for setbacks or for not making progress.  Try to recognize your accomplishments, even if they are small.  Keep all follow-up visits as told by your health care provider. This is important. Contact a health care provider if:  Your symptoms do not get better.  Your symptoms get worse.  You have signs of depression, such as: ? A persistently sad, cranky, or irritable mood. ? Loss of enjoyment in activities that used to bring you joy. ? Change in weight or eating. ? Changes in sleeping habits. ? Avoiding friends or family members. ? Loss of energy for normal tasks. ? Feelings of guilt or worthlessness. Get help right away if:  You have serious thoughts about hurting yourself or others. If you ever feel like you may hurt yourself or others, or have thoughts about taking your own life, get help right away. You can go to your nearest emergency department or call:  Your local emergency services (911 in the [place]  A suicide crisis helpline, such as the National Suicide Prevention Lifeline at 856-704-8304. This is open 24 hours a day.  Summary  Generalized anxiety disorder (GAD) is a mental health disorder that involves worry that is not triggered by a specific  event.  People with GAD often worry excessively about many things in their lives, such as their health and family.  GAD may cause physical symptoms such as restlessness, trouble concentrating, sleep problems, frequent sweating, nausea, diarrhea, headaches, and trembling or muscle twitching.  A mental health specialist can help determine which treatment is best for you. Some people see improvement with one type of therapy. However, other people require a combination of therapies. This information is not intended to replace advice given to you by your health care provider. Make sure you discuss any questions you have with your health care provider. Document Released: 01/29/2013 Document Revised: 08/24/2016 Document Reviewed: 08/24/2016 [employer] Interactive Patient Education  2018 [employer]  Gastroesophageal Reflux Disease, Adult Normally, food travels down the esophagus and stays in the stomach to be digested. However, when a person has gastroesophageal reflux disease (GERD), food and stomach acid move back up into the esophagus. When this happens, the esophagus becomes sore and inflamed. Over time, GERD can create small holes (ulcers) in the lining of the esophagus. What are the causes? This condition is caused by a problem with the muscle between the esophagus and the stomach (lower esophageal sphincter, or LES). Normally, the LES muscle closes after food passes through the esophagus to the stomach. When the LES is weakened or abnormal, it does not close properly, and that allows food and stomach acid to  go back up into the esophagus. The LES can be weakened by certain dietary substances, medicines, and medical conditions, including:  Tobacco use.  Pregnancy.  Having a hiatal hernia.  Heavy alcohol use.  Certain foods and beverages, such as coffee, chocolate, onions, and peppermint.  What increases the risk? This condition is more likely to develop in:  People who have an increased  body weight.  People who have connective tissue disorders.  People who use NSAID medicines.  What are the signs or symptoms? Symptoms of this condition include:  Heartburn.  Difficult or painful swallowing.  The feeling of having a lump in the throat.  Abitter taste in the mouth.  Bad breath.  Having a large amount of saliva.  Having an upset or bloated stomach.  Belching.  Chest pain.  Shortness of breath or wheezing.  Ongoing (chronic) cough or a night-time cough.  Wearing away of tooth enamel.  Weight loss.  Different conditions can cause chest pain. Make sure to see your health care provider if you experience chest pain. How is this diagnosed? Your health care provider will take a medical history and perform a physical exam. To determine if you have mild or severe GERD, your health care provider may also monitor how you respond to treatment. You may also have other tests, including:  An endoscopy toexamine your stomach and esophagus with a small camera.  A test thatmeasures the acidity level in your esophagus.  A test thatmeasures how much pressure is on your esophagus.  A barium swallow or modified barium swallow to show the shape, size, and functioning of your esophagus.  How is this treated? The goal of treatment is to help relieve your symptoms and to prevent complications. Treatment for this condition may vary depending on how severe your symptoms are. Your health care provider may recommend:  Changes to your diet.  Medicine.  Surgery.  Follow these instructions at home: Diet  Follow a diet as recommended by your health care provider. This may involve avoiding foods and drinks such as: ? Coffee and tea (with or without caffeine). ? Drinks that containalcohol. ? Energy drinks and sports drinks. ? Carbonated drinks or sodas. ? Chocolate and cocoa. ? Peppermint and mint flavorings. ? Garlic and onions. ? Horseradish. ? Spicy and acidic  foods, including peppers, chili powder, curry powder, vinegar, hot sauces, and barbecue sauce. ? Citrus fruit juices and citrus fruits, such as oranges, lemons, and limes. ? Tomato-based foods, such as red sauce, chili, salsa, and pizza with red sauce. ? Fried and fatty foods, such as donuts, french fries, potato chips, and high-fat dressings. ? High-fat meats, such as hot dogs and fatty cuts of red and white meats, such as rib eye steak, sausage, ham, and bacon. ? High-fat dairy items, such as whole milk, butter, and cream cheese.  Eat small, frequent meals instead of large meals.  Avoid drinking large amounts of liquid with your meals.  Avoid eating meals during the 2-3 hours before bedtime.  Avoid lying down right after you eat.  Do not exercise right after you eat. General instructions  Pay attention to any changes in your symptoms.  Take over-the-counter and prescription medicines only as told by your health care provider. Do not take aspirin, ibuprofen, or other NSAIDs unless your health care provider told you to do so.  Do not use any tobacco products, including cigarettes, chewing tobacco, and e-cigarettes. If you need help quitting, ask your health care provider.  Wear loose-fitting  clothing. Do not wear anything tight around your waist that causes pressure on your abdomen.  Raise (elevate) the head of your bed 6 inches (15cm).  Try to reduce your stress, such as with yoga or meditation. If you need help reducing stress, ask your health care provider.  If you are overweight, reduce your weight to an amount that is healthy for you. Ask your health care provider for guidance about a safe weight loss goal.  Keep all follow-up visits as told by your health care provider. This is important. Contact a health care provider if:  You have new symptoms.  You have unexplained weight loss.  You have difficulty swallowing, or it hurts to swallow.  You have wheezing or a  persistent cough.  Your symptoms do not improve with treatment.  You have a hoarse voice. Get help right away if:  You have pain in your arms, neck, jaw, teeth, or back.  You feel sweaty, dizzy, or light-headed.  You have chest pain or shortness of breath.  You vomit and your vomit looks like blood or coffee grounds.  You faint.  Your stool is bloody or black.  You cannot swallow, drink, or eat. This information is not intended to replace advice given to you by your health care provider. Make sure you discuss any questions you have with your health care provider. Document Released: 07/14/2005 Document Revised: 03/03/2016 Document Reviewed: 01/29/2015 [employer] Interactive Patient Education  [employer].

## 2018-02-22 NOTE — Progress Notes (Signed)
Chief Complaint  Patient presents with  . Follow-up   F/u  1. Anxiety on zoloft 50 mg qd helping  2. Still having throat clearing unsure if GERD or allergies tried prilosec did not work other meds dexilant, nexium, prilosec have not worked in the past Reviewed barium swallow, CT neck no etiology found   3. 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  HENT: Positive for sore throat.   Eyes: Negative for blurred vision.  Respiratory: Negative for shortness of breath.   Cardiovascular: Negative for chest pain.  Musculoskeletal: Positive for myalgias.  Skin: Negative for rash.   Past Medical History:  Diagnosis Date  . Anxiety   . Colon polyp    Benign, colonoscopy 2015  . GERD (gastroesophageal reflux disease)   . Globus sensation   . History of chicken pox   . Lymph node enlargement   . Wears contact lenses    Past Surgical History:  Procedure Laterality Date  . COLONOSCOPY WITH ESOPHAGOGASTRODUODENOSCOPY (EGD)    . ESOPHAGOGASTRODUODENOSCOPY (EGD) WITH PROPOFOL N/A 10/21/2017   Procedure: ESOPHAGOGASTRODUODENOSCOPY (EGD) WITH PROPOFOL;  Surgeon: Midge Minium, MD;  Location: [facility];  Service: Endoscopy;  Laterality: N/A;  . FINGER SURGERY     fracture  . TONSILLECTOMY    . WISDOM TOOTH EXTRACTION     Family History  Problem Relation Age of Onset  . Thyroid disease Mother        Living  . Goiter Mother   . Cirrhosis Father 76       Deceased  . Alcohol abuse Father        liver cirrhosis   . Cancer Paternal Grandfather        Lung Cancer  . Cancer Maternal Grandfather   . Lymphoma Sister        Neck  . Melanoma Sister   . Skin cancer Sister    Social History   Socioeconomic History  . Marital status: Married    Spouse name: Not on file  . Number of children: Not on file  . Years of education: Not on file  .  Highest education level: Not on file  Occupational History  . Occupation: Gaffer  Social Needs  . Financial resource strain: Not on file  . Food insecurity:    Worry: Not on file    Inability: Not on file  . Transportation needs:    Medical: Not on file    Non-medical: Not on file  Tobacco Use  . Smoking status: Never Smoker  . Smokeless tobacco: Never Used  Substance and Sexual Activity  . Alcohol use: Yes    Alcohol/week: 0.0 oz    Comment: rare  . Drug use: No  . Sexual activity: Yes    Partners: Female  Lifestyle  . Physical activity:    Days per week: Not on file    Minutes per session: Not on file  . Stress: Not on file  Relationships  . Social connections:    Talks on phone: Not on file    Gets together: Not on file    Attends religious service: Not on file    Active member of club or organization: Not on file    Attends meetings of clubs or organizations: Not on file    Relationship status: Not on file  . Intimate partner violence:  Fear of current or ex partner: Not on file    Emotionally abused: Not on file    Physically abused: Not on file    Forced sexual activity: Not on file  Other Topics Concern  . Not on file  Social History Narrative   Married    1 son    Works Glass blower/designer   Current Meds  Medication Sig  . sertraline (ZOLOFT) 50 MG tablet Take 1 tablet (50 mg total) by mouth daily. In am   No Known Allergies Recent Results (from the past 2160 hour(s))  HIV antibody (with reflex)     Status: None   Collection Time: 12/07/17  8:04 AM  Result Value Ref Range   HIV 1&2 Ab, 4th Generation NON-REACTIVE NON-REACTI    Comment: HIV-1 antigen and HIV-1/HIV-2 antibodies were not detected. There is no laboratory evidence of HIV infection. Marland Kitchen PLEASE NOTE: This information has been disclosed to you from records whose confidentiality may be protected by state law.  If your state requires such protection, then the state law  prohibits you from making any further disclosure of the information without the specific written consent of the person to whom it pertains, or as otherwise permitted by law. A general authorization for the release of medical or other information is NOT sufficient for this purpose. . For additional information please refer to http://education.quest[facility].com/faq/FAQ106 (This link is being provided for informational/ educational purposes only.) . Marland Kitchen The performance of this assay has not been clinically validated in patients less than 27 years old. .   RPR     Status: None   Collection Time: 12/07/17  8:04 AM  Result Value Ref Range   RPR Ser Ql NON-REACTIVE NON-REACTI  HSV 2 antibody, IgG     Status: None   Collection Time: 12/07/17  8:04 AM  Result Value Ref Range   HSV 2 Glycoprotein G Ab, IgG <0.90 index    Comment:                           Index          Interpretation                           -----          --------------                           <0.90          Negative                           0.90-1.09      Equivocal                           >1.09          Positive . This assay utilizes recombinant type-specific antigens to differentiate HSV-1 from HSV-2 infections. A positive result cannot distinguish between recent and past infection. If recent HSV infection is suspected but the results are negative or equivocal, the assay should be repeated in 4-6 weeks. The performance characteristics of the assay have not been established for pediatric populations, immunocompromised patients, or neonatal screening.   HSV 1 antibody, IgG     Status: Abnormal   Collection Time: 12/07/17  8:04 AM  Result  Value Ref Range   HSV 1 Glycoprotein G Ab, IgG 2.44 (H) index    Comment:                           Index          Interpretation                           -----          --------------                           <0.90          Negative                           0.90-1.09       Equivocal                           >1.09          Positive . This assay utilizes recombinant type-specific antigens to differentiate HSV-1 from HSV-2 infections. A positive result cannot distinguish between recent and past infection. If recent HSV infection is suspected but the results are negative or equivocal, the assay should be repeated in 4-6 weeks. The performance characteristics of the assay have not been established for pediatric populations, immunocompromised patients, or neonatal screening.   Hepatitis C antibody     Status: None   Collection Time: 12/07/17  8:04 AM  Result Value Ref Range   Hepatitis C Ab NON-REACTIVE NON-REACTI   SIGNAL TO CUT-OFF 0.02 <1.00    Comment: . HCV antibody was non-reactive. There is no laboratory  evidence of HCV infection. . In most cases, no further action is required. However, if recent HCV exposure is suspected, a test for HCV RNA (test code 40981) is suggested. . For additional information please refer to http://education.quest[facility].com/faq/FAQ22v1 (This link is being provided for informational/ educational purposes only.) .   Hepatitis B surface antigen     Status: None   Collection Time: 12/07/17  8:04 AM  Result Value Ref Range   Hepatitis B Surface Ag NON-REACTIVE NON-REACTI  Hepatitis B surface antibody     Status: None   Collection Time: 12/07/17  8:04 AM  Result Value Ref Range   Hepatitis B-Post 203 > OR = 10 mIU/mL    Comment: . Patient has immunity to hepatitis B virus. . For additional information, please refer to http://education.[facility].com/faq/FAQ105 (This link is being provided for informational/ educational purposes only).   Lipid panel     Status: Abnormal   Collection Time: 12/07/17  8:04 AM  Result Value Ref Range   Cholesterol 193 0 - 200 mg/dL    Comment: ATP III Classification       Desirable:  < 200 mg/dL               Borderline High:  200 - 239 mg/dL          High:  > = 191  mg/dL   Triglycerides 47.8 0.0 - 149.0 mg/dL    Comment: Normal:  <295 mg/dLBorderline High:  150 - 199 mg/dL   HDL 62.13 >08.65 mg/dL   VLDL 9.2 0.0 - 78.4 mg/dL   LDL Cholesterol 696 (H) 0 - 99 mg/dL   Total CHOL/HDL Ratio 4  Comment:                Men          Women1/2 Average Risk     3.4          3.3Average Risk          5.0          4.42X Average Risk          9.6          7.13X Average Risk          15.0          11.0                       NonHDL 148.02     Comment: NOTE:  Non-HDL goal should be 30 mg/dL higher than patient's LDL goal (i.e. LDL goal of < 70 mg/dL, would have non-HDL goal of < 100 mg/dL)  T3, free     Status: None   Collection Time: 12/07/17  8:04 AM  Result Value Ref Range   T3, Free 3.7 2.3 - 4.2 pg/mL  Urinalysis, Routine w reflex microscopic     Status: Abnormal   Collection Time: 12/07/17  8:04 AM  Result Value Ref Range   Color, Urine YELLOW Yellow;Lt. Yellow   APPearance CLEAR Clear   Specific Gravity, Urine >=1.030 (A) 1.000 - 1.030   pH 6.0 5.0 - 8.0   Total Protein, Urine NEGATIVE Negative   Urine Glucose NEGATIVE Negative   Ketones, ur NEGATIVE Negative   Bilirubin Urine NEGATIVE Negative   Hgb urine dipstick NEGATIVE Negative   Urobilinogen, UA 0.2 0.0 - 1.0   Leukocytes, UA NEGATIVE Negative   Nitrite NEGATIVE Negative   WBC, UA 0-2/hpf 0-2/hpf   RBC / HPF none seen 0-2/hpf   Squamous Epithelial / LPF Rare(0-4/hpf) Rare(0-4/hpf)  T4, free     Status: None   Collection Time: 12/07/17  8:04 AM  Result Value Ref Range   Free T4 0.93 0.60 - 1.60 ng/dL    Comment: Specimens from patients who are undergoing biotin therapy and /or ingesting biotin supplements may contain high levels of biotin.  The higher biotin concentration in these specimens interferes with this Free T4 assay.  Specimens that contain high levels  of biotin may cause false high results for this Free T4 assay.  Please interpret results in light of the total clinical presentation  of the patient.    TSH     Status: None   Collection Time: 12/07/17  8:04 AM  Result Value Ref Range   TSH 1.19 0.35 - 4.50 uIU/mL  CBC with Differential/Platelet     Status: None   Collection Time: 12/07/17  8:04 AM  Result Value Ref Range   WBC 4.3 4.0 - 10.5 K/uL   RBC 5.21 4.22 - 5.81 Mil/uL   Hemoglobin 16.2 13.0 - 17.0 g/dL   HCT 21.3 08.6 - 57.8 %   MCV 90.7 78.0 - 100.0 fl   MCHC 34.3 30.0 - 36.0 g/dL   RDW 46.9 62.9 - 52.8 %   Platelets 210.0 150.0 - 400.0 K/uL   Neutrophils Relative % 49.5 43.0 - 77.0 %   Lymphocytes Relative 40.4 12.0 - 46.0 %   Monocytes Relative 8.8 3.0 - 12.0 %   Eosinophils Relative 1.1 0.0 - 5.0 %   Basophils Relative 0.2 0.0 - 3.0 %   Neutro Abs 2.1 1.4 - 7.7 K/uL  Lymphs Abs 1.7 0.7 - 4.0 K/uL   Monocytes Absolute 0.4 0.1 - 1.0 K/uL   Eosinophils Absolute 0.0 0.0 - 0.7 K/uL   Basophils Absolute 0.0 0.0 - 0.1 K/uL  Comprehensive metabolic panel     Status: Abnormal   Collection Time: 12/07/17  8:04 AM  Result Value Ref Range   Sodium 141 135 - 145 mEq/L   Potassium 4.3 3.5 - 5.1 mEq/L   Chloride 102 96 - 112 mEq/L   CO2 33 (H) 19 - 32 mEq/L   Glucose, Bld 89 70 - 99 mg/dL   BUN 15 6 - 23 mg/dL   Creatinine, Ser 1.61 0.40 - 1.50 mg/dL   Total Bilirubin 1.1 0.2 - 1.2 mg/dL   Alkaline Phosphatase 59 39 - 117 U/L   AST 19 0 - 37 U/L   ALT 22 0 - 53 U/L   Total Protein 7.4 6.0 - 8.3 g/dL   Albumin 4.6 3.5 - 5.2 g/dL   Calcium 9.9 8.4 - 09.6 mg/dL   GFR 045.40 >98.11 mL/min   Objective  Body mass index is 23.1 kg/m. Wt Readings from Last 3 Encounters:  02/22/18 184 lb 12.8 oz (83.8 kg)  11/25/17 185 lb (83.9 kg)  10/21/17 181 lb (82.1 kg)   Temp Readings from Last 3 Encounters:  02/22/18 (!) 97.5 F (36.4 C) (Oral)  11/25/17 97.6 F (36.4 C) (Oral)  10/21/17 97.6 F (36.4 C)   BP Readings from Last 3 Encounters:  02/22/18 126/70  11/25/17 120/62  10/21/17 125/74   Pulse Readings from Last 3 Encounters:  02/22/18 (!) 59   11/25/17 64  10/21/17 61    Physical Exam  Constitutional: He is oriented to person, place, and time. Vital signs are normal. He appears well-developed and well-nourished. He is cooperative.  HENT:  Head: Normocephalic and atraumatic.  Mouth/Throat: Oropharynx is clear and moist and mucous membranes are normal.  Eyes: Pupils are equal, round, and reactive to light. Conjunctivae are normal.  Cardiovascular: Normal rate, regular rhythm and normal heart sounds.  Pulmonary/Chest: Effort normal and breath sounds normal.  Neurological: He is alert and oriented to person, place, and time. Gait normal.  Skin: Skin is warm, dry and intact.  Psychiatric: He has a normal mood and affect. His speech is normal and behavior is normal. Judgment and thought content normal. Cognition and memory are normal.  Nursing note and vitals reviewed.   Assessment   1. Anxiety  2. Post nasal drip vs GERD causing throat sx's imaging neg and barium and EGD neg  3. MSK pain and back pain due to heavy lifting  4. HM Plan  1. Increase zoloft to 75 mg qd let me know in 2-4 weeks if wants to inc to 100 mg qd it is helping  2. Trial of protonix  Let me know which nose spray has at home and will try other options PND On allegra otc 180 qam  3. Avoid nsaids try beshel chiro. Massage, accupunture, tylenol Avoid heavy lifting  4.  Declines flu shot  Tdap had  Never had HPV shot  Hep B immune    Saw derm 2-3 years ago from 2018/2019  Had EGD x 2 and colonoscopies x 2 in the past Dr. Earleen Newport Dr. Jenne Pane ENT   Provider: Dr. French Ana McLean-Scocuzza-Internal Medicine

## 2018-03-07 ENCOUNTER — Encounter: Payer: Self-pay | Admitting: Internal Medicine

## 2018-03-07 ENCOUNTER — Other Ambulatory Visit: Payer: Self-pay | Admitting: Internal Medicine

## 2018-03-07 DIAGNOSIS — R0982 Postnasal drip: Secondary | ICD-10-CM

## 2018-03-07 MED ORDER — FLUTICASONE PROPIONATE 50 MCG/ACT NA SUSP: 2.0000 | Freq: Every day | NASAL | 2 refills | Status: DC

## 2018-03-14 ENCOUNTER — Encounter: Payer: Self-pay | Admitting: Internal Medicine

## 2018-03-15 ENCOUNTER — Other Ambulatory Visit: Payer: Self-pay | Admitting: Internal Medicine

## 2018-03-15 DIAGNOSIS — R0982 Postnasal drip: Secondary | ICD-10-CM

## 2018-03-15 MED ORDER — IPRATROPIUM BROMIDE 0.06 % NA SOLN: 2.0000 | Freq: Four times a day (QID) | NASAL | 1 refills | Status: DC

## 2018-03-16 ENCOUNTER — Other Ambulatory Visit: Payer: Self-pay | Admitting: Internal Medicine

## 2018-03-16 DIAGNOSIS — R0989 Other specified symptoms and signs involving the circulatory and respiratory systems: Secondary | ICD-10-CM

## 2018-03-16 DIAGNOSIS — J398 Other specified diseases of upper respiratory tract: Secondary | ICD-10-CM

## 2018-03-22 ENCOUNTER — Encounter: Payer: Self-pay | Admitting: Internal Medicine

## 2018-03-22 ENCOUNTER — Ambulatory Visit (INDEPENDENT_AMBULATORY_CARE_PROVIDER_SITE_OTHER): Payer: PRIVATE HEALTH INSURANCE | Admitting: Internal Medicine

## 2018-03-22 VITALS — BP 130/88 | HR 78 | Resp 16 | Ht 75.0 in | Wt 187.0 lb

## 2018-03-22 DIAGNOSIS — F458 Other somatoform disorders: Secondary | ICD-10-CM | POA: Diagnosis not present

## 2018-03-22 DIAGNOSIS — J0410 Acute tracheitis without obstruction: Secondary | ICD-10-CM | POA: Diagnosis not present

## 2018-03-22 DIAGNOSIS — R0989 Other specified symptoms and signs involving the circulatory and respiratory systems: Secondary | ICD-10-CM

## 2018-03-22 NOTE — Patient Instructions (Addendum)
Will need to obtain records from your ENT doctor.  Will start Arnuity inhaler, take one puff once daily for one month. Call back if symptoms have not resolved and we can set you up for a bronchoscopy to rule out any abnormalities of your trachea.

## 2018-03-22 NOTE — Progress Notes (Signed)
[facility]  Pulmonary Medicine Consultation      Assessment and Plan:  Globus/odynophagia, with possible tracheitis. - Patient has an uncomfortable sensation over the infra thyroid portion of his trachea, with mild pain on palpation, and with swallowing. - We will start empirically on Arnuity inhaler daily for 1 month. - I discussed that final step in this work-up would be a bronchoscopy, though he denies cough, tracheitis could still be a possibility, I also explained that, though unlikely, it is possible that a small tumor or inflammation of the trachea could be missed by CAT scan.  I will try him empirically on an inhaler and if there is no improvement he is asked to call us back if he would like to investigate further and we can schedule him for bronchoscopy. He also expressed that the reassurance of knowing that there is nothing there could be helpful for him.    Date: 03/22/2018  MRN# [id number] Adam Bishop Dec 19, 1985    Adam Bishop is a 32 y.o. old male seen in consultation for chief complaint of:    Chief Complaint  Patient presents with  . Consult    referred by T. Mclean-Scocuzza for eval of trachea/globus sensation.  . 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    HPI:   The symptoms started in September 2018, he started noticing abnormal sensation in his throat through out the day. When he swallows he has an irritation/mild tenderdess/fullness in the front of his throat.  It has not been progressive since that time, he thinks it may be occurring less often though it continues to occur every day.. Insidious onset. He has to clear his throat a lot but does not really have a cough. No pets at home.   He has been tested for allergies when he was a child, and he used an inhaler for asthma, but he grew out of it.  He works in Public librarianautomotive painting. He has been seen by ENT and had a scope which was negative.  [facility] and [facility] and [facility]GD on 10/21/17, esophagus and stomach were normal. TSH, T3, T4 were normal. He then underwent Ct neck on 11/03/17; found a 12 mm thyroid nodule. Then led to a thyroid ultrasound which did not have any significant findings.  He had a barium swallow on 02/13/18 which was normal.  The symptoms have not improved but there are days where is it not as bad.   He is not currently on any inhalers, he was tried empirically on a nasal spray, but it caused nose bleed. He has not been on any inhalers since this started.   PMHX:   Past Medical History:  Diagnosis Date  . Anxiety   . Colon polyp    Benign, colonoscopy 2015  . GERD (gastroesophageal reflux disease)   . Globus sensation   . History of chicken pox   . Lymph node enlargement   . Wears contact lenses    Surgical Hx:  Past Surgical History:  Procedure Laterality Date  . COLONOSCOPY WITH ESOPHAGOGASTRODUODENOSCOPY (EGD)    . ESOPHAGOGASTRODUODENOSCOPY (EGD) WITH PROPOFOL N/A 10/21/2017   Procedure: ESOPHAGOGASTRODUODENOSCOPY (EGD) WITH PROPOFOL;  Surgeon: Midge MiniumWohl, Darren, MD;  [facility];  Service: Endoscopy;  Laterality: N/A;  . FINGER SURGERY     fracture  . TONSILLECTOMY    . WISDOM TOOTH EXTRACTION     Family Hx:  Family History  Problem Relation Age of Onset  . Thyroid disease Mother        Living  . Goiter Mother   . Cirrhosis Father 59       Deceased  . Alcohol abuse Father        liver cirrhosis   . Cancer Paternal Grandfather        Lung Cancer  . Cancer Maternal Grandfather   . Lymphoma Sister        Neck  . Melanoma Sister   . Skin cancer Sister    Social Hx:   Social History   Tobacco Use  . Smoking status: Never Smoker  . Smokeless tobacco: Never Used  Substance Use Topics  . Alcohol use: Yes    Alcohol/week: 0.0 oz    Comment: rare  . Drug use: No   Medication:    Current Outpatient Medications:  .  fluticasone (FLONASE) 50 MCG/ACT nasal spray, Place 2  sprays into both nostrils daily., Disp: 16 g, Rfl: 2 .  ipratropium (ATROVENT) 0.06 % nasal spray, Place 2 sprays into both nostrils 4 (four) times daily., Disp: 15 mL, Rfl: 1 .  pantoprazole (PROTONIX) 40 MG tablet, Take 1 tablet (40 mg total) by mouth daily. 30 minutes before breakfast, Disp: 90 tablet, Rfl: 0 .  sertraline (ZOLOFT) 50 MG tablet, Take 1.5 tablets (75 mg total) by mouth daily. In am, Disp: 45 tablet, Rfl: 0   Allergies:  Patient has no known allergies.  Review of Systems: Gen:  Denies  fever, sweats, chills HEENT: Denies blurred vision, double vision. bleeds, sore throat Cvc:  No dizziness, chest pain. Resp:   Denies cough or sputum production, shortness of breath Gi: Denies swallowing difficulty, stomach pain. Gu:  Denies bladder incontinence, burning urine Ext:   No Joint pain, stiffness. Skin: No skin rash,  hives  Endoc:  No polyuria, polydipsia. Psych: No depression, insomnia. Other:  All other systems were reviewed with the patient and were negative other that what is mentioned in the HPI.   Physical Examination:   VS: BP 130/88 (BP Location: Left Arm, Cuff Size: Normal)   Pulse 78   Resp 16   Ht 6\' 3"  (1.905 m)   Wt 187 lb (84.8 kg)   SpO2 91%   BMI 23.37 kg/m   General Appearance: No distress  Neuro:without focal findings,  speech normal,  HEENT: PERRLA, EOM intact.   Pulmonary: normal breath sounds, No wheezing.  CardiovascularNormal S1,S2.  No m/r/g.   Abdomen: Benign, Soft, non-tender. Renal:  No costovertebral tenderness  GU:  No performed at this time. Endoc: No evident thyromegaly, no signs of acromegaly. Skin:   warm, no rashes, no ecchymosis  Extremities: normal, no cyanosis, clubbing.  Other findings:    LABORATORY PANEL:   CBC No results for input(s): WBC, HGB, HCT, PLT in the last 168 hours. ------------------------------------------------------------------------------------------------------------------  Chemistries  No  results for input(s): NA, K, CL, CO2, GLUCOSE, BUN, CREATININE, CALCIUM, MG, AST, ALT, ALKPHOS, BILITOT in the last 168 hours.  Invalid input(s): GFRCGP ------------------------------------------------------------------------------------------------------------------  Cardiac Enzymes No results for input(s): TROPONINI in the last 168 hours. ------------------------------------------------------------  RADIOLOGY:  No results found.     Thank  you for the consultation and for allowing [facility] to assist in the care of your patient. Our recommendations are noted above.  Please contact us if we can be of further service.   Wells Guiles, MD.  Board Certified in Internal Medicine, Pulmonary  Medicine, Critical Care Medicine, and Sleep Medicine.  [facility] Office Number: [phone]  Santiago Glad, M.D.  Billy Fischer, M.D  03/22/2018

## 2018-03-23 MED ORDER — FLUTICASONE FUROATE 100 MCG/ACT IN AEPB: 1.0000 | INHALATION_SPRAY | Freq: Every day | RESPIRATORY_TRACT | 0 refills | Status: DC

## 2018-03-23 NOTE — Addendum Note (Signed)
Addended by: Janean SarkSNIPES, SONYA K on: 03/23/2018 09:55 AM   Modules accepted: Orders

## 2018-04-12 ENCOUNTER — Encounter: Payer: Self-pay | Admitting: Internal Medicine

## 2018-04-12 ENCOUNTER — Telehealth: Payer: Self-pay | Admitting: Internal Medicine

## 2018-04-12 NOTE — Telephone Encounter (Signed)
Patient states he is ready for a scope procedure Please call to schedule

## 2018-04-13 ENCOUNTER — Telehealth: Payer: Self-pay | Admitting: *Deleted

## 2018-04-13 NOTE — Telephone Encounter (Signed)
Responded to pt's my chart message and informed him once bronch is scheduled he will receive a call. Nothing further needed.

## 2018-04-13 NOTE — Telephone Encounter (Signed)
Reg bronch 04/17/2018 Dr.Ramachandran DX:J41.0 chronic bronchitis

## 2018-04-13 NOTE — Telephone Encounter (Signed)
04/13/18 at 2:11 pm EST [employer] and spoke with Shanda BumpsJessica. [phone] is a valid and billable code which does not require PA. Call Ref # [id number]. Rhonda J Cobb

## 2018-04-14 ENCOUNTER — Telehealth: Payer: Self-pay | Admitting: *Deleted

## 2018-04-14 NOTE — Telephone Encounter (Signed)
Spoke with patient on 6/27 re: scheduling bronch. Pt made aware NPO after midnight Must have driver [facility] at 12 noon.  Attempted twice to call patient today 6/28 left detailed message that bronch still on for Monday.

## 2018-04-17 ENCOUNTER — Encounter: Admission: RE | Payer: Self-pay | Source: Ambulatory Visit

## 2018-04-17 ENCOUNTER — Ambulatory Visit
Admission: RE | Admit: 2018-04-17 | Payer: PRIVATE HEALTH INSURANCE | Source: Ambulatory Visit | Admitting: Internal Medicine

## 2018-04-17 SURGERY — BRONCHOSCOPY, FLEXIBLE
Anesthesia: Moderate Sedation

## 2018-04-17 MED ORDER — LACTATED RINGERS IV SOLN: INTRAVENOUS | Status: DC

## 2018-04-17 NOTE — Telephone Encounter (Signed)
Pt calling stating he received the messages about his procedure today  But he is calling asking for a call back stating he is needing to reschedule this for he has a family matter come up  Please call back to reschedule

## 2018-04-17 NOTE — Telephone Encounter (Signed)
Patient will call back next week to let us know when he wants to reschedule.

## 2018-05-10 ENCOUNTER — Telehealth: Payer: Self-pay | Admitting: Internal Medicine

## 2018-05-10 ENCOUNTER — Encounter: Payer: Self-pay | Admitting: *Deleted

## 2018-05-10 NOTE — Telephone Encounter (Signed)
Pt calling asking for a call back, stating he'd like to reschedule his Bronc   Please call to schedule

## 2018-05-10 NOTE — Telephone Encounter (Signed)
Returned call to patient. Bronch will be scheduled 05/29/18 @1 . Pt aware 12 noon arrival and npo after midnight. Nothing further needed.

## 2018-05-10 NOTE — Telephone Encounter (Signed)
Please re-place orders for bronchoscopy which will be on 05/29/2018.

## 2018-05-11 ENCOUNTER — Other Ambulatory Visit: Payer: Self-pay | Admitting: Internal Medicine

## 2018-05-11 DIAGNOSIS — J42 Unspecified chronic bronchitis: Secondary | ICD-10-CM

## 2018-05-29 ENCOUNTER — Encounter: Payer: Self-pay | Admitting: Anesthesiology

## 2018-05-29 ENCOUNTER — Ambulatory Visit
Admission: RE | Admit: 2018-05-29 | Discharge: 2018-05-29 | Disposition: A | Discharge status: Home / Self Care | Payer: PRIVATE HEALTH INSURANCE | Source: Ambulatory Visit | Attending: Internal Medicine | Admitting: Internal Medicine

## 2018-05-29 ENCOUNTER — Other Ambulatory Visit: Payer: Self-pay

## 2018-05-29 ENCOUNTER — Telehealth: Payer: Self-pay | Admitting: *Deleted

## 2018-05-29 ENCOUNTER — Encounter: Payer: Self-pay | Admitting: *Deleted

## 2018-05-29 ENCOUNTER — Encounter
Admission: RE | Disposition: A | Discharge status: Home / Self Care | Payer: Self-pay | Source: Ambulatory Visit | Attending: Internal Medicine

## 2018-05-29 DIAGNOSIS — Z801 Family history of malignant neoplasm of trachea, bronchus and lung: Secondary | ICD-10-CM | POA: Insufficient documentation

## 2018-05-29 DIAGNOSIS — F458 Other somatoform disorders: Secondary | ICD-10-CM | POA: Diagnosis not present

## 2018-05-29 DIAGNOSIS — K219 Gastro-esophageal reflux disease without esophagitis: Secondary | ICD-10-CM | POA: Diagnosis not present

## 2018-05-29 DIAGNOSIS — F419 Anxiety disorder, unspecified: Secondary | ICD-10-CM | POA: Diagnosis not present

## 2018-05-29 DIAGNOSIS — R0600 Dyspnea, unspecified: Secondary | ICD-10-CM | POA: Insufficient documentation

## 2018-05-29 DIAGNOSIS — J42 Unspecified chronic bronchitis: Secondary | ICD-10-CM

## 2018-05-29 DIAGNOSIS — R05 Cough: Secondary | ICD-10-CM | POA: Insufficient documentation

## 2018-05-29 DIAGNOSIS — Z79899 Other long term (current) drug therapy: Secondary | ICD-10-CM | POA: Insufficient documentation

## 2018-05-29 HISTORY — PX: FLEXIBLE BRONCHOSCOPY: SHX5094

## 2018-05-29 SURGERY — BRONCHOSCOPY, FLEXIBLE
Anesthesia: Moderate Sedation

## 2018-05-29 MED ORDER — LACTATED RINGERS IV SOLN
INTRAVENOUS | Status: DC
  Administered 2018-05-29: 12:00:00 via INTRAVENOUS

## 2018-05-29 MED ORDER — MIDAZOLAM HCL 2 MG/2ML IJ SOLN
INTRAMUSCULAR | Status: AC
  Filled 2018-05-29: qty 8

## 2018-05-29 MED ORDER — FENTANYL CITRATE (PF) 100 MCG/2ML IJ SOLN
INTRAMUSCULAR | Status: AC
  Filled 2018-05-29: qty 4

## 2018-05-29 MED ORDER — PHENYLEPHRINE HCL 0.25 % NA SOLN
1.0000 | Freq: Four times a day (QID) | NASAL | Status: DC | PRN
  Filled 2018-05-29: qty 15

## 2018-05-29 MED ORDER — FENTANYL CITRATE (PF) 100 MCG/2ML IJ SOLN
INTRAMUSCULAR | Status: DC | PRN
  Administered 2018-05-29 (×3): 25 ug via INTRAVENOUS

## 2018-05-29 MED ORDER — LIDOCAINE HCL 2 % EX GEL
1.0000 "application " | Freq: Once | CUTANEOUS | Status: DC
  Filled 2018-05-29: qty 5

## 2018-05-29 MED ORDER — BUTAMBEN-TETRACAINE-BENZOCAINE 2-2-14 % EX AERO
1.0000 | INHALATION_SPRAY | Freq: Once | CUTANEOUS | Status: DC
  Filled 2018-05-29: qty 20

## 2018-05-29 MED ORDER — MIDAZOLAM HCL 2 MG/2ML IJ SOLN
INTRAMUSCULAR | Status: DC | PRN
  Administered 2018-05-29 (×2): 1 mg via INTRAVENOUS
  Administered 2018-05-29: 2 mg via INTRAVENOUS

## 2018-05-29 NOTE — Op Note (Signed)
  [facility]            Bronchoscopy Note   FINDINGS/SUMMARY:   - Normal airways, anatomic variant with accessory right upper lobe bronchus segment. - No evidence of tracheitis. - Bronchoalveolar lavage, brushing taken from right middle lobe.  Indication: globus, dyspnea. The patient (or their representative) was informed of the risks (including but not limited to bleeding, infection, respiratory failure, lung injury, tooth/oral injury) and benefits of the procedure and gave consent, see chart.   Pre-op diagnosis: Dyspnea and globus sensation. Post-op diagnosis: Same Estimated blood loss: None  Medications for procedure: Versed 6 mg, fentanyl 75 mcg.  I was present for the duration of the procedure, and supervised conscious sedation for total of 30 minutes.  Procedure description: After obtaining informed consent, timeout was called to confirm the patient and the procedure.  The right and left nares were checked for patency, the right nares appear to be more patent, the right naris was anesthetized with topical lidocaine jelly.  The flexible fiberoptic bronchoscope was then passed via the right nares to the posterior pharynx.  The laryngeal area was normal, normal movements of the vocal cords was appreciated.  The bronchoscope was then passed via the vocal cords to the trachea, no abnormalities were found throughout careful inspection of the upper and lower trachea.  An anatomical tumor was undertaken, all segments were visualized, no abnormal areas were noted.  There was an accessory bronchus seen in the anterior aspect of the right upper lobe. The bronchoscope was taken to the right middle lobe, transbronchial brushings were performed.  Bronchoalveolar lavage was taken and sent for cytology and microbiology.  As adequate specimens have been obtained, the bronchoscope was removed.    Condition post procedure: stable.    Complications: none noted.      Marda Stalker, M.D., F.C.C.P. Board Certified in Internal Medicine, Pulmonary Medicine, [place], and Sleep Medicine.  [facility] Office Number: [phone]  05/29/2018

## 2018-05-29 NOTE — Telephone Encounter (Signed)
-----   Message from Shane CrutchPradeep Ramachandran, MD sent at 05/29/2018  1:29 PM EDT ----- Regarding: POST BRONCH FOLLOW UP Pt needs fu in 3-4 weeks post bronch today.

## 2018-05-29 NOTE — H&P (Signed)
Adam Bishop is an 32 y.o. male.   Chief Complaint: chronic cough.   Pt here for bronch for chronic cough, no new complaints.   Past Medical History:  Diagnosis Date  . Anxiety   . Colon polyp    Benign, colonoscopy 2015  . GERD (gastroesophageal reflux disease)   . Globus sensation   . History of chicken pox   . Lymph node enlargement   . Wears contact lenses     Past Surgical History:  Procedure Laterality Date  . COLONOSCOPY WITH ESOPHAGOGASTRODUODENOSCOPY (EGD)    . ESOPHAGOGASTRODUODENOSCOPY (EGD) WITH PROPOFOL N/A 10/21/2017   Procedure: ESOPHAGOGASTRODUODENOSCOPY (EGD) WITH PROPOFOL;  Surgeon: Midge MiniumWohl, Darren, MD;  [facility];  Service: Endoscopy;  Laterality: N/A;  . FINGER SURGERY     fracture  . TONSILLECTOMY    . WISDOM TOOTH EXTRACTION      Family History  Problem Relation Age of Onset  . Thyroid disease Mother        Living  . Goiter Mother   . Cirrhosis Father [id number]       Deceased  . Alcohol abuse Father        liver cirrhosis   . Cancer Paternal Grandfather        Lung Cancer  . Cancer Maternal Grandfather   . Lymphoma Sister        Neck  . Melanoma Sister   . Skin cancer Sister    Social History:  reports that he has never smoked. He has never used smokeless tobacco. He reports that he drinks alcohol. He reports that he does not use drugs.  Allergies: No Known Allergies  Medications Prior to Admission  Medication Sig Dispense Refill  . sertraline (ZOLOFT) 50 MG tablet Take 1.5 tablets (75 mg total) by mouth daily. In am 45 tablet 0  . fluticasone (FLONASE) 50 MCG/ACT nasal spray Place 2 sprays into both nostrils daily. (Patient not taking: Reported on 05/22/2018) 16 g 2  . Fluticasone Furoate (ARNUITY ELLIPTA) 100 MCG/ACT AEPB Inhale 1 puff into the lungs daily. (Patient not taking: Reported on 05/22/2018) 30 each 0  . ipratropium (ATROVENT) 0.06 % nasal spray Place 2 sprays into both nostrils 4 (four) times daily. (Patient not taking:  Reported on 05/22/2018) 15 mL 1  . pantoprazole (PROTONIX) 40 MG tablet Take 1 tablet (40 mg total) by mouth daily. 30 minutes before breakfast (Patient not taking: Reported on 05/22/2018) 90 tablet 0    No results found for this or any previous visit (from the past 48 hour(s)). No results found.  Review of Systems  Constitutional: Negative for chills and fever.  HENT: Negative for hearing loss.   Eyes: Negative for blurred vision.  Respiratory: Negative for wheezing.   Cardiovascular: Negative for chest pain.  Gastrointestinal: Negative for vomiting.  Genitourinary: Negative for urgency.  Musculoskeletal: Negative for neck pain.  Skin: Negative for rash.  Neurological: Negative for tingling.  Psychiatric/Behavioral: Negative for suicidal ideas.    There were no vitals taken for this visit. Physical Exam  Constitutional: He appears well-developed.  HENT:  Head: Normocephalic.  Eyes: Pupils are equal, round, and reactive to light.  Neck: Normal range of motion.  Cardiovascular: Normal rate.  Respiratory: Effort normal.  GI: Soft.  Musculoskeletal: Normal range of motion.  Neurological: He is alert.  Skin: Skin is warm.     Assessment/Plan Will proceed with bronchoscopy.   Shane CrutchPradeep Payeton Germani, MD 05/29/2018, 12:02 PM

## 2018-05-29 NOTE — Discharge Instructions (Signed)
AMBULATORY SURGERY  °DISCHARGE INSTRUCTIONS ° ° °1) The drugs that you were given will stay in your system until tomorrow so for the next 24 hours you should not: ° °A) Drive an automobile °B) Make any legal decisions °C) Drink any alcoholic beverage ° ° °2) You may resume regular meals tomorrow.  Today it is better to start with liquids and gradually work up to solid foods. ° °You may eat anything you prefer, but it is better to start with liquids, then soup and crackers, and gradually work up to solid foods. ° ° °3) Please notify your doctor immediately if you have any unusual bleeding, trouble breathing, redness and pain at the surgery site, drainage, fever, or pain not relieved by medication. ° ° ° °4) Additional Instructions: ° ° ° ° ° ° ° °Please contact your physician with any problems or Same Day Surgery at [phone], Monday through Friday 6 am to 4 pm, or [facility] [place] Main number at [phone]. °

## 2018-05-30 ENCOUNTER — Encounter: Payer: Self-pay | Admitting: Internal Medicine

## 2018-05-30 LAB — CYTOLOGY - NON PAP

## 2018-05-30 NOTE — Telephone Encounter (Signed)
Patient scheduled 9/3 with Dr. Ardyth Manam

## 2018-05-31 LAB — ACID FAST SMEAR (AFB, MYCOBACTERIA)

## 2018-05-31 LAB — ACID FAST SMEAR (AFB): ACID FAST SMEAR - AFSCU2: NEGATIVE

## 2018-06-01 LAB — CULTURE, BAL-QUANTITATIVE

## 2018-06-01 LAB — CULTURE, BAL-QUANTITATIVE W GRAM STAIN: Culture: 500 — AB

## 2018-06-05 ENCOUNTER — Telehealth: Payer: Self-pay | Admitting: Internal Medicine

## 2018-06-05 MED ORDER — DOXYCYCLINE HYCLATE 100 MG PO CAPS: 100.0000 mg | ORAL_CAPSULE | Freq: Two times a day (BID) | ORAL | 0 refills | Status: DC

## 2018-06-05 NOTE — Telephone Encounter (Signed)
Just to be on the safe side, would recommend a course of doxycycline 100 mg bid for 1 week.

## 2018-06-05 NOTE — Telephone Encounter (Signed)
Pt states he had a procedure on 8/12 and since Thursday  he has been coughing up thick green stuff and yesterday it had some blood in it. Please call to discuss.

## 2018-06-05 NOTE — Telephone Encounter (Signed)
rx sent. Patient aware. 

## 2018-06-07 LAB — VIRUS CULTURE

## 2018-06-14 ENCOUNTER — Other Ambulatory Visit: Payer: Self-pay | Admitting: Internal Medicine

## 2018-06-14 DIAGNOSIS — F419 Anxiety disorder, unspecified: Secondary | ICD-10-CM

## 2018-06-14 MED ORDER — SERTRALINE HCL 50 MG PO TABS: 75.0000 mg | ORAL_TABLET | Freq: Every day | ORAL | 5 refills | Status: DC

## 2018-06-19 NOTE — Progress Notes (Signed)
[facility] Consultation      Assessment and Plan:  Globus/odynophagia. - Patient has an uncomfortable sensation over the infra thyroid portion of his trachea, with mild pain on palpation, and with swallowing. -CT neck, bronchoscopy, EGD did not reveal any abnormality in this area, patient was provided reassurance.  Chronic bronchitis with excess mucus production. - Discussed that this may be secondary to occupational exposures, and/or allergens. - He is empirically given samples of Tudorza inhaler, if it helps, he can fill the prescription.  Follow-up as needed.   Date: 06/19/2018  MRN# [id number] Adam Bishop 06-18-1986    Adam Bishop is a 32 y.o. old male seen in consultation for chief complaint of:    Chief Complaint  Patient presents with  . Follow-up    clears throat alot: dry cough:     HPI:  The patient is a 32 yo male who is seen with possible tracheitis, possible globus sensation. Workup was negative, including CT neck, EGD, barium swallow.  He was tried empirically on a steroid inhaler without any improvement. He works in [employer]. He has been seen by ENT and had a scope which was negative.  EGD on 10/21/17, esophagus and stomach were normal. TSH, T3, T4 were normal. Ct neck on 11/03/17; found a 12 mm thyroid nodule. Then led to a thyroid ultrasound which did not have any significant findings.  Barium swallow on 02/13/18 which was normal.   We opted to proceed with bronchoscopy which was negative, his cultures and cytology were negative.  After the procedure he developed some mild hemoptysis, and excess mucus production, he was given empiric antibiotics and the symptoms resolved.   Medication:    Current Outpatient Medications:  .  doxycycline (VIBRAMYCIN) 100 MG capsule, Take 1 capsule (100 mg total) by mouth 2 (two) times daily., Disp: 14 capsule, Rfl: 0 .  fluticasone (FLONASE) 50 MCG/ACT nasal spray, Place 2 sprays into both  nostrils daily. (Patient not taking: Reported on 05/22/2018), Disp: 16 g, Rfl: 2 .  Fluticasone Furoate (ARNUITY ELLIPTA) 100 MCG/ACT AEPB, Inhale 1 puff into the lungs daily. (Patient not taking: Reported on 05/22/2018), Disp: 30 each, Rfl: 0 .  ipratropium (ATROVENT) 0.06 % nasal spray, Place 2 sprays into both nostrils 4 (four) times daily. (Patient not taking: Reported on 05/22/2018), Disp: 15 mL, Rfl: 1 .  pantoprazole (PROTONIX) 40 MG tablet, Take 1 tablet (40 mg total) by mouth daily. 30 minutes before breakfast (Patient not taking: Reported on 05/22/2018), Disp: 90 tablet, Rfl: 0 .  sertraline (ZOLOFT) 50 MG tablet, Take 1.5 tablets (75 mg total) by mouth daily. In am, Disp: 45 tablet, Rfl: 5   Allergies:  Patient has no known allergies.  Review of Systems:  Constitutional: Feels well. Cardiovascular: No chest pain.  Pulmonary: Denies dyspnea.   The remainder of systems were reviewed and were found to be negative other than what is documented in the HPI.    Physical Examination:   VS: BP 124/70 (BP Location: Left Arm, Cuff Size: Normal)   Pulse 79   Ht 6\' 3"  (1.905 m)   Wt 194 lb (88 kg)   SpO2 99%   BMI 24.25 kg/m   General Appearance: No distress  Neuro:without focal findings, mental status, speech normal, alert and oriented HEENT: PERRLA, EOM intact Pulmonary: No wheezing, No rales  CardiovascularNormal S1,S2.  No m/r/g.  Abdomen: Benign, Soft, non-tender, No masses Renal:  No costovertebral tenderness  GU:  No performed at  this time. Endoc: No evident thyromegaly, no signs of acromegaly or Cushing features Skin:   warm, no rashes, no ecchymosis  Extremities: normal, no cyanosis, clubbing.      LABORATORY PANEL:   CBC No results for input(s): WBC, HGB, HCT, PLT in the last 168 hours. ------------------------------------------------------------------------------------------------------------------  Chemistries  No results for input(s): NA, K, CL, CO2, GLUCOSE, BUN,  CREATININE, CALCIUM, MG, AST, ALT, ALKPHOS, BILITOT in the last 168 hours.  Invalid input(s): GFRCGP ------------------------------------------------------------------------------------------------------------------  Cardiac Enzymes No results for input(s): TROPONINI in the last 168 hours. ------------------------------------------------------------  RADIOLOGY:  No results found.     Thank  you for the consultation and for allowing [facility] to assist in the care of your patient. Our recommendations are noted above.  Please contact us if we can be of further service.   Wells Guiles, M.D., F.C.C.P.  Board Certified in Internal Medicine, Pulmonary Medicine, Critical Care Medicine, and Sleep Medicine.  [facility] Office Number: [phone]  06/19/2018

## 2018-06-20 ENCOUNTER — Encounter: Payer: Self-pay | Admitting: Internal Medicine

## 2018-06-20 ENCOUNTER — Ambulatory Visit (INDEPENDENT_AMBULATORY_CARE_PROVIDER_SITE_OTHER): Payer: PRIVATE HEALTH INSURANCE | Admitting: Internal Medicine

## 2018-06-20 VITALS — BP 124/70 | HR 79 | Ht 75.0 in | Wt 194.0 lb

## 2018-06-20 DIAGNOSIS — J42 Unspecified chronic bronchitis: Secondary | ICD-10-CM | POA: Diagnosis not present

## 2018-06-20 DIAGNOSIS — J0410 Acute tracheitis without obstruction: Secondary | ICD-10-CM

## 2018-06-20 DIAGNOSIS — F458 Other somatoform disorders: Secondary | ICD-10-CM | POA: Diagnosis not present

## 2018-06-20 DIAGNOSIS — R0989 Other specified symptoms and signs involving the circulatory and respiratory systems: Secondary | ICD-10-CM

## 2018-06-20 MED ORDER — ACLIDINIUM BROMIDE 400 MCG/ACT IN AEPB: 1.0000 | INHALATION_SPRAY | Freq: Two times a day (BID) | RESPIRATORY_TRACT | 5 refills | Status: DC

## 2018-06-20 MED ORDER — ACLIDINIUM BROMIDE 400 MCG/ACT IN AEPB: 1.0000 | INHALATION_SPRAY | Freq: Two times a day (BID) | RESPIRATORY_TRACT | 0 refills | Status: DC

## 2018-06-20 NOTE — Patient Instructions (Addendum)
Follow up as needed.   Try the sample of tudorza 1 puff once to twice per day. If it helps you can fill the prescription, and use the coupon.

## 2018-06-20 NOTE — Addendum Note (Signed)
Addended by: Janean Sark on: 06/20/2018 03:38 PM   Modules accepted: Orders

## 2018-06-22 ENCOUNTER — Telehealth: Payer: Self-pay | Admitting: Internal Medicine

## 2018-06-22 LAB — ORGANISM IDENTIFICATION, MOLD

## 2018-06-22 LAB — MOLD ORGANISM REFLEX

## 2018-06-22 NOTE — Telephone Encounter (Signed)
Patient calling needing to go over Bronc results  Please call back

## 2018-06-27 ENCOUNTER — Ambulatory Visit: Payer: PRIVATE HEALTH INSURANCE | Admitting: Internal Medicine

## 2018-06-28 LAB — CULTURE, FUNGUS WITHOUT SMEAR

## 2018-07-12 LAB — ACID FAST CULTURE WITH REFLEXED SENSITIVITIES: ACID FAST CULTURE - AFSCU3: NEGATIVE

## 2018-08-07 DIAGNOSIS — M25511 Pain in right shoulder: Secondary | ICD-10-CM | POA: Insufficient documentation

## 2018-08-07 DIAGNOSIS — M7541 Impingement syndrome of right shoulder: Secondary | ICD-10-CM | POA: Insufficient documentation

## 2018-08-16 ENCOUNTER — Encounter

## 2018-08-16 ENCOUNTER — Ambulatory Visit (INDEPENDENT_AMBULATORY_CARE_PROVIDER_SITE_OTHER): Payer: PRIVATE HEALTH INSURANCE

## 2018-08-16 ENCOUNTER — Ambulatory Visit (INDEPENDENT_AMBULATORY_CARE_PROVIDER_SITE_OTHER): Payer: PRIVATE HEALTH INSURANCE | Admitting: Internal Medicine

## 2018-08-16 ENCOUNTER — Encounter: Payer: Self-pay | Admitting: Internal Medicine

## 2018-08-16 VITALS — BP 134/70 | HR 67 | Temp 98.3°F | Ht 75.0 in | Wt 196.6 lb

## 2018-08-16 DIAGNOSIS — Z0184 Encounter for antibody response examination: Secondary | ICD-10-CM

## 2018-08-16 DIAGNOSIS — Z0000 Encounter for general adult medical examination without abnormal findings: Secondary | ICD-10-CM

## 2018-08-16 DIAGNOSIS — R062 Wheezing: Secondary | ICD-10-CM | POA: Diagnosis not present

## 2018-08-16 DIAGNOSIS — F458 Other somatoform disorders: Secondary | ICD-10-CM

## 2018-08-16 DIAGNOSIS — F411 Generalized anxiety disorder: Secondary | ICD-10-CM

## 2018-08-16 DIAGNOSIS — Z1159 Encounter for screening for other viral diseases: Secondary | ICD-10-CM

## 2018-08-16 DIAGNOSIS — E785 Hyperlipidemia, unspecified: Secondary | ICD-10-CM

## 2018-08-16 DIAGNOSIS — E559 Vitamin D deficiency, unspecified: Secondary | ICD-10-CM

## 2018-08-16 DIAGNOSIS — R0989 Other specified symptoms and signs involving the circulatory and respiratory systems: Secondary | ICD-10-CM

## 2018-08-16 DIAGNOSIS — Z8709 Personal history of other diseases of the respiratory system: Secondary | ICD-10-CM

## 2018-08-16 DIAGNOSIS — Z1329 Encounter for screening for other suspected endocrine disorder: Secondary | ICD-10-CM

## 2018-08-16 DIAGNOSIS — R09A2 Foreign body sensation, throat: Secondary | ICD-10-CM

## 2018-08-16 DIAGNOSIS — Z1389 Encounter for screening for other disorder: Secondary | ICD-10-CM

## 2018-08-16 NOTE — Progress Notes (Signed)
Pre visit review using our clinic review tool, if applicable. No additional management support is needed unless otherwise documented below in the visit note. 

## 2018-08-16 NOTE — Patient Instructions (Addendum)
sch fasting labs 12/07/18  Acute Bronchitis, Adult Acute bronchitis is sudden (acute) swelling of the air tubes (bronchi) in the lungs. Acute bronchitis causes these tubes to fill with mucus, which can make it hard to breathe. It can also cause coughing or wheezing. In adults, acute bronchitis usually goes away within 2 weeks. A cough caused by bronchitis may last up to 3 weeks. Smoking, allergies, and asthma can make the condition worse. Repeated episodes of bronchitis may cause further lung problems, such as chronic obstructive pulmonary disease (COPD). What are the causes? This condition can be caused by germs and by substances that irritate the lungs, including:  Cold and flu viruses. This condition is most often caused by the same virus that causes a cold.  Bacteria.  Exposure to tobacco smoke, dust, fumes, and air pollution.  What increases the risk? This condition is more likely to develop in people who:  Have close contact with someone with acute bronchitis.  Are exposed to lung irritants, such as tobacco smoke, dust, fumes, and vapors.  Have a weak immune system.  Have a respiratory condition such as asthma.  What are the signs or symptoms? Symptoms of this condition include:  A cough.  Coughing up clear, yellow, or green mucus.  Wheezing.  Chest congestion.  Shortness of breath.  A fever.  Body aches.  Chills.  A sore throat.  How is this diagnosed? This condition is usually diagnosed with a physical exam. During the exam, your health care provider may order tests, such as chest X-rays, to rule out other conditions. He or she may also:  Test a sample of your mucus for bacterial infection.  Check the level of oxygen in your blood. This is done to check for pneumonia.  Do a chest X-ray or lung function testing to rule out pneumonia and other conditions.  Perform blood tests.  Your health care provider will also ask about your symptoms and medical  history. How is this treated? Most cases of acute bronchitis clear up over time without treatment. Your health care provider may recommend:  Drinking more fluids. Drinking more makes your mucus thinner, which may make it easier to breathe.  Taking a medicine for a fever or cough.  Taking an antibiotic medicine.  Using an inhaler to help improve shortness of breath and to control a cough.  Using a cool mist vaporizer or humidifier to make it easier to breathe.  Follow these instructions at home: Medicines  Take over-the-counter and prescription medicines only as told by your health care provider.  If you were prescribed an antibiotic, take it as told by your health care provider. Do not stop taking the antibiotic even if you start to feel better. General instructions  Get plenty of rest.  Drink enough fluids to keep your urine clear or pale yellow.  Avoid smoking and secondhand smoke. Exposure to cigarette smoke or irritating chemicals will make bronchitis worse. If you smoke and you need help quitting, ask your health care provider. Quitting smoking will help your lungs heal faster.  Use an inhaler, cool mist vaporizer, or humidifier as told by your health care provider.  Keep all follow-up visits as told by your health care provider. This is important. How is this prevented? To lower your risk of getting this condition again:  Wash your hands often with soap and water. If soap and water are not available, use hand sanitizer.  Avoid contact with people who have cold symptoms.  Try not to  touch your hands to your mouth, nose, or eyes.  Make sure to get the flu shot every year.  Contact a health care provider if:  Your symptoms do not improve in 2 weeks of treatment. Get help right away if:  You cough up blood.  You have chest pain.  You have severe shortness of breath.  You become dehydrated.  You faint or keep feeling like you are going to faint.  You keep  vomiting.  You have a severe headache.  Your fever or chills gets worse. This information is not intended to replace advice given to you by your health care provider. Make sure you discuss any questions you have with your health care provider. Document Released: 11/11/2004 Document Revised: 04/28/2016 Document Reviewed: 03/24/2016 Elsevier Interactive Patient Education  2018 [employer].  Cholesterol Cholesterol is a white, waxy, fat-like substance that is needed by the human body in small amounts. The liver makes all the cholesterol we need. Cholesterol is carried from the liver by the blood through the blood vessels. Deposits of cholesterol (plaques) may build up on blood vessel (artery) walls. Plaques make the arteries narrower and stiffer. Cholesterol plaques increase the risk for heart attack and stroke. You cannot feel your cholesterol level even if it is very high. The only way to know that it is high is to have a blood test. Once you know your cholesterol levels, you should keep a record of the test results. Work with your health care provider to keep your levels in the desired range. What do the results mean?  Total cholesterol is a rough measure of all the cholesterol in your blood.  LDL (low-density lipoprotein) is the "bad" cholesterol. This is the type that causes plaque to build up on the artery walls. You want this level to be low.  HDL (high-density lipoprotein) is the "good" cholesterol because it cleans the arteries and carries the LDL away. You want this level to be high.  Triglycerides are fat that the body can either burn for energy or store. High levels are closely linked to heart disease. What are the desired levels of cholesterol?  Total cholesterol below 200.  LDL below 100 for people who are at risk, below 70 for people at very high risk.  HDL above 40 is good. A level of 60 or higher is considered to be protective against heart disease.  Triglycerides below  150. How can I lower my cholesterol? Diet Follow your diet program as told by your health care provider.  Choose fish or white meat chicken and [place], roasted or baked. Limit fatty cuts of red meat, fried foods, and processed meats, such as sausage and lunch meats.  Eat lots of fresh fruits and vegetables.  Choose whole grains, beans, pasta, potatoes, and cereals.  Choose olive oil, corn oil, or canola oil, and use only small amounts.  Avoid butter, mayonnaise, shortening, or palm kernel oils.  Avoid foods with trans fats.  Drink skim or nonfat milk and eat low-fat or nonfat yogurt and cheeses. Avoid whole milk, cream, ice cream, egg yolks, and full-fat cheeses.  Healthier desserts include angel food cake, ginger snaps, animal crackers, hard candy, popsicles, and low-fat or nonfat frozen yogurt. Avoid pastries, cakes, pies, and cookies.  Exercise  Follow your exercise program as told by your health care provider. A regular program: ? Helps to decrease LDL and raise HDL. ? Helps with weight control.  Do things that increase your activity level, such as gardening, walking,  and taking the stairs.  Ask your health care provider about ways that you can be more active in your daily life.  Medicine  Take over-the-counter and prescription medicines only as told by your health care provider. ? Medicine may be prescribed by your health care provider to help lower cholesterol and decrease the risk for heart disease. This is usually done if diet and exercise have failed to bring down cholesterol levels. ? If you have several risk factors, you may need medicine even if your levels are normal.  This information is not intended to replace advice given to you by your health care provider. Make sure you discuss any questions you have with your health care provider. Document Released: 06/29/2001 Document Revised: 05/01/2016 Document Reviewed: 04/03/2016 Elsevier Interactive Patient Education   [employer].

## 2018-08-16 NOTE — Progress Notes (Signed)
Chief Complaint  Patient presents with  . Follow-up   F/u  1. trachetis globus sensation sx's improved though he does do a lot of throat clearing bronch +penicillum species and given Tudorza inhaler by Dr. Juanell Fairly using bid he cant tell if helps.  2. C/o wheeze on right side of chest, h/o asthma as a kid and he had an albuterol inhaler reviewed CXR 2015 mild hyperinflation could be voluntary or acute bronchitis. He also c/o green phelgm w/in the last week in the am nothing tried  3. Anxiety/hypochondria stopped zoloft 75 mg over time due to weight gait and wanting to eat wt goal is 185 lbs  4. HLD repeat labs due 12/07/18 he started working out recently    Review of Systems  Constitutional: Negative for weight loss.  HENT: Negative for hearing loss.   Eyes: Negative for blurred vision.  Respiratory: Positive for sputum production and wheezing.   Cardiovascular: Negative for chest pain.  Skin: Negative for rash.  Psychiatric/Behavioral: The patient is not nervous/anxious.    Past Medical History:  Diagnosis Date  . Anxiety   . Colon polyp    Benign, colonoscopy 2015  . GERD (gastroesophageal reflux disease)   . Globus sensation   . History of chicken pox   . Lymph node enlargement   . Wears contact lenses    Past Surgical History:  Procedure Laterality Date  . COLONOSCOPY WITH ESOPHAGOGASTRODUODENOSCOPY (EGD)    . ESOPHAGOGASTRODUODENOSCOPY (EGD) WITH PROPOFOL N/A 10/21/2017   Procedure: ESOPHAGOGASTRODUODENOSCOPY (EGD) WITH PROPOFOL;  Surgeon: Lucilla Lame, MD;  Location: [facility];  Service: Endoscopy;  Laterality: N/A;  . FINGER SURGERY     fracture  . FLEXIBLE BRONCHOSCOPY N/A 05/29/2018   Procedure: FLEXIBLE BRONCHOSCOPY;  Surgeon: Laverle Hobby, MD;  Location: [facility];  Service: Pulmonary;  Laterality: N/A;  . TONSILLECTOMY    . WISDOM TOOTH EXTRACTION     Family History  Problem Relation Age of Onset  . Thyroid disease Mother        Living  . Goiter  Mother   . Cirrhosis Father 35       Deceased  . Alcohol abuse Father        liver cirrhosis   . Cancer Paternal Grandfather        Lung Cancer  . Cancer Maternal Grandfather   . Lymphoma Sister        Neck  . Melanoma Sister   . Skin cancer Sister    Social History   Socioeconomic History  . Marital status: Married    Spouse name: Not on file  . Number of children: Not on file  . Years of education: Not on file  . Highest education level: Not on file  Occupational History  . Occupation: Recruitment consultant  Social Needs  . Financial resource strain: Not on file  . Food insecurity:    Worry: Not on file    Inability: Not on file  . Transportation needs:    Medical: Not on file    Non-medical: Not on file  Tobacco Use  . Smoking status: Never Smoker  . Smokeless tobacco: Never Used  Substance and Sexual Activity  . Alcohol use: Yes    Alcohol/week: 0.0 standard drinks    Comment: rare  . Drug use: No  . Sexual activity: Yes    Partners: Female  Lifestyle  . Physical activity:    Days per week: Not on file    Minutes per session: Not on file  .  Stress: Not on file  Relationships  . Social connections:    Talks on phone: Not on file    Gets together: Not on file    Attends religious service: Not on file    Active member of club or organization: Not on file    Attends meetings of clubs or organizations: Not on file    Relationship status: Not on file  . Intimate partner violence:    Fear of current or ex partner: Not on file    Emotionally abused: Not on file    Physically abused: Not on file    Forced sexual activity: Not on file  Other Topics Concern  . Not on file  Social History Narrative   Married    1 son    Works Mudlogger   Current [place]  . Aclidinium Bromide (TUDORZA PRESSAIR) 400 MCG/ACT AEPB Inhale 1 puff into the lungs 2 (two) times daily.  . sertraline (ZOLOFT) 50 MG tablet Take 1.5 tablets (75 mg total) by  mouth daily. In am   No Known Allergies Recent Results (from the past 2160 hour(s))  Organism Identification, Mold     Status: None   Collection Time: 05/29/18 11:41 AM  Result Value Ref Range   Organism Identification, Mold Final report     Comment: (NOTE) Performed At: [facility] [place], [place] [id number] Rush Farmer MD [id number]    Source of Sample BRONCHIAL ALVEOLAR LAVAGE     Comment: Performed at [facility], [place] [address] [place], [place] [address]  Mold organism reflex     Status: None   Collection Time: 05/29/18 11:41 AM  Result Value Ref Range   Mold Result - 1 Penicillium species     Comment: (NOTE) Performed At: [facility] [address] [place], [place] [id number] Rush Farmer MD [id number]   Cytology - Non PAP; right middle lobe     Status: None   Collection Time: 05/29/18  1:27 PM  Result Value Ref Range   CYTOLOGY - NON GYN      Cytology - Non PAP CASE: [id number] PATIENT: Adam Bishop Non-Gyn Cytology Report     SPECIMEN SUBMITTED: A. Middle lobe,right  CLINICAL HISTORY: Chronic cough  PRE-OPERATIVE DIAGNOSIS: Dyspnea and globus sensation  POST-OPERATIVE DIAGNOSIS: None provided.     DIAGNOSIS: A. LUNG, RIGHT MIDDLE LOBE; BRONCHOALVEOLAR LAVAGE: - NEGATIVE FOR MALIGNANCY. - REACTIVE BRONCHIAL CELLS, ALVEOLAR MACROPHAGES, AND INFLAMMATION.  See concurrent case [id number].   GROSS DESCRIPTION: A. Site: Right middle lobe Procedure: Bronchoscopy Cytotechnologist: Rivka Barbara and Ashlee Howze-Soremekun Specimen(s) collected: 0 diff Quik stained slides 0 Pap stained slides Specimen labeled: RML wash                      Volume: 15 mL                      Description: Cloudy pink to clear fluid                      Submitted for: ThinPrep    Final Diagnosis performed by Quay Burow, MD.   Electronically signed 05/30/2018 3:10:22PM The electronic signature indicates  that  the named Attending Pathologist has evaluated the specimen  Technical component performed at [facility], [address] [place], [place] [address] Lab: [phone] Dir: Rush Farmer, MD, MMM  Professional component performed at [facility], [facility], [place]  [place], [place], [place] [address] Lab: [phone] Dir: Dellia Nims. Rubinas, MD   Cytology - Non PAP; RIGHT MIDDLE LOBE     Status: None   Collection Time: 05/29/18  1:27 PM  Result Value Ref Range   CYTOLOGY - NON GYN      Cytology - Non PAP CASE: [id number] PATIENT: Adam Bishop Non-Gyn Cytology Report     SPECIMEN SUBMITTED: A. Middle lobe,right  CLINICAL HISTORY: Chronic cough  PRE-OPERATIVE DIAGNOSIS: Dyspnea and globus sensation  POST-OPERATIVE DIAGNOSIS: None provided.     DIAGNOSIS: A. LUNG, RIGHT MIDDLE LOBE; BRONCHIAL BRUSHING: - NEGATIVE FOR MALIGNANCY. - REACTIVE BRONCHIAL CELLS.  See concurrent case [id number].  GROSS DESCRIPTION: A. Site: Right middle lobe Procedure: Bronchoscopy Cytotechnologist: Rivka Barbara and Ashlee Howze-Soremekun Specimen(s) collected: 2 diff Quik stained slides 0 Pap stained slides Specimen labeled: RML brush                      Volume: Not applicable                      Description: Clear CytoLyt solution with a brush and blue material                      Submitted for: ThinPrep    Final Diagnosis performed by Quay Burow, MD.   Electronically signed 05/30/2018 3:14:35PM The electronic signature indicates that the  named Attending Pathologist has evaluated the specimen  Technical component performed at [facility], [address] [place], [place] [address] Lab: [phone] Dir: Rush Farmer, MD, MMM  Professional component performed at [facility], [facility], [place], [place], [place] [address] Lab: [phone] Dir: Dellia Nims. Reuel Derby, MD   Culture, bal-quantitative     Status: Abnormal   Collection Time: 05/29/18   1:50 PM  Result Value Ref Range   Specimen Description      BRONCHIAL ALVEOLAR LAVAGE Performed at [facility], [address] [place], [place] [address]    Special Requests      BRONCHIAL ALVEOLAR LAVAGE Performed at [facility], [address] [place], [place] [address]    Gram Stain      FEW WBC PRESENT, PREDOMINANTLY MONONUCLEAR NO ORGANISMS SEEN    Culture (A)     500 COLONIES/mL Consistent with normal respiratory flora. Performed at [facility], [place] [address] [place], [place] [address]    Report Status 06/01/2018 FINAL   Acid Fast Smear (AFB)     Status: None   Collection Time: 05/29/18  1:50 PM  Result Value Ref Range   AFB Specimen Processing Concentration    Acid Fast Smear Negative     Comment: (NOTE) Performed At: [facility] [address] [place], [place] [id number] Rush Farmer MD [id number]    Source (AFB) BRONCHIAL ALVEOLAR LAVAGE     Comment: Performed at [facility], [address] [place], [place] [address]  Acid Fast Culture with reflexed sensitivities     Status: None   Collection Time: 05/29/18  1:50 PM  Result Value Ref Range   Acid Fast Culture Negative     Comment: (NOTE) No acid fast bacilli isolated after 6 weeks. Performed At: [facility] [place], [place] [id number] Rush Farmer MD GY:[id number]    Source of Sample BRONCHIAL ALVEOLAR LAVAGE     Comment: Performed at [facility], [address] [place], [place] [address]  Culture, fungus without smear  Status: Abnormal   Collection Time: 05/29/18  1:50 PM  Result Value Ref Range   Specimen Description      BRONCHIAL ALVEOLAR LAVAGE Performed at [facility], [address], [place], [place] [address]    Special Requests      BRONCHIAL ALVEOLAR LAVAGE Performed at [facility], [address] [place], [place] [address]    Culture (A)     PENICILLIUM SPECIES Performed  at [employer] Performed at [facility], [place] [address], [place], [place] [address]    Report Status 06/28/2018 FINAL   Virus culture     Status: None   Collection Time: 05/29/18  1:50 PM  Result Value Ref Range   Viral Culture No virus isolated.     Comment: (NOTE) Performed At: [facility] [address] [place], [place] [id number] Rush Farmer MD KY:[id number] CORRECTED ON 08/21 AT 1036: PREVIOUSLY REPORTED AS Comment    Source of Sample BRONCHIAL ALVEOLAR LAVAGE     Comment: Performed at [facility], [address] [place], [place] [address]   Objective  Body mass index is 24.57 kg/m. Wt Readings from Last 3 Encounters:  08/16/18 196 lb 9.6 oz (89.2 kg)  06/20/18 194 lb (88 kg)  05/29/18 191 lb 6.4 oz (86.8 kg)   Temp Readings from Last 3 Encounters:  08/16/18 98.3 F (36.8 C) (Oral)  05/29/18 98 F (36.7 C) (Temporal)  02/22/18 (!) 97.5 F (36.4 C) (Oral)   BP Readings from Last 3 Encounters:  08/16/18 134/70  06/20/18 124/70  05/29/18 123/82   Pulse Readings from Last 3 Encounters:  08/16/18 67  06/20/18 79  05/29/18 81    Physical Exam  Constitutional: He is oriented to person, place, and time. Vital signs are normal. He appears well-developed and well-nourished. He is cooperative.  HENT:  Head: Normocephalic and atraumatic.  Mouth/Throat: Oropharynx is clear and moist and mucous membranes are normal.  Eyes: Pupils are equal, round, and reactive to light. Conjunctivae are normal.  Cardiovascular: Normal rate, regular rhythm and normal heart sounds.  Pulmonary/Chest: Effort normal and breath sounds normal.  Neurological: He is alert and oriented to person, place, and time. Gait normal.  Skin: Skin is warm, dry and intact.  Benign nevi to trunk    Psychiatric: He has a normal mood and affect. His speech is normal and behavior is normal. Judgment and thought content normal. Cognition and memory are normal.  Nursing note  and vitals reviewed.   Assessment   1. Tracheitis/globus sensation improved  2. Hyperinflation vs bronchitis with h/o asthma as child  3. Anxiety/hypochondria  4. HLD  5. HM Plan   1. S/p bronch with pulm 05/29/18 negative other then penicillum species  Pt does not note much benefit from [place]  2. cxr today  If abnormal refer back to pulm consider for pfts  3. Off zoloft due to c/w weight gain 4. Given cholesterol handout today  Check fasting labs 12/07/18 5.  Declines flu shot  Tdap had  Never had HPV shot  Hep B immune   Check MMR status  Saw derm 2-3 years ago from 2018/2019  Had EGD x 2 and colonoscopies x 2 in the past Dr. Carlean Jews Dr. Redmond Baseman ENT  Provider: Dr. Olivia Mackie McLean-Scocuzza-Internal Medicine

## 2018-08-21 ENCOUNTER — Encounter: Payer: Self-pay | Admitting: Internal Medicine

## 2018-09-04 ENCOUNTER — Ambulatory Visit: Payer: PRIVATE HEALTH INSURANCE | Admitting: Podiatry

## 2018-09-04 ENCOUNTER — Encounter: Payer: Self-pay | Admitting: Podiatry

## 2018-09-04 VITALS — BP 128/71 | HR 84

## 2018-09-04 DIAGNOSIS — M79675 Pain in left toe(s): Secondary | ICD-10-CM

## 2018-09-04 DIAGNOSIS — B351 Tinea unguium: Secondary | ICD-10-CM | POA: Diagnosis not present

## 2018-09-04 NOTE — Addendum Note (Signed)
Addended byMaury Dus: Adam Bishop on: 09/04/2018 04:49 PM   Modules accepted: Orders

## 2018-09-04 NOTE — Progress Notes (Signed)
This patient presents the office with chief complaint of toenail fungus especially on his left foot.  He says he has had thickened nails since high school.  He relates that he had an injury to the big toenail on the right foot with the big toe  nail completely came off.  He states that he has used medication on the nails but the nails continue to be fungus.  He presents the office today for continued evaluation and treatment of his thick disfigured discolored nails left foot  General Appearance  Alert, conversant and in no acute stress.  Vascular  Dorsalis pedis and posterior tibial  pulses are palpable  bilaterally.  Capillary return is within normal limits  bilaterally. Temperature is within normal limits  Bilaterally. Hair present on digits and foot.  Neurologic  Senn-Weinstein monofilament wire test within normal limits  bilaterally. Muscle power within normal limits bilaterally.  Nails Thick disfigured discolored nails with subungual debris  from second  to fifth toes left foot.. No evidence of bacterial infection or drainage bilaterally. The right hallux nail is unattached from nail bed.  Orthopedic  No limitations of motion  feet .  No crepitus or effusions noted.  No bony pathology or digital deformities noted.  Skin  normotropic skin with no porokeratosis noted bilaterally.  No signs of infections or ulcers noted.    Onychomycosis  Left foot  IE.  Discussed this condition with this patient.  I recommended he have his nails evaluated by the lab to confirm a fungal diagnosis.  I also recommended that he be treated with Lamisil by mouth if it is positive from the lab.  To be called with results.   Helane GuntherGregory Keiasha Diep DPM

## 2018-10-20 ENCOUNTER — Encounter: Payer: Self-pay | Admitting: Internal Medicine

## 2018-10-24 ENCOUNTER — Encounter: Payer: Self-pay | Admitting: Family Medicine

## 2018-10-24 ENCOUNTER — Ambulatory Visit (INDEPENDENT_AMBULATORY_CARE_PROVIDER_SITE_OTHER): Payer: PRIVATE HEALTH INSURANCE | Admitting: Family Medicine

## 2018-10-24 ENCOUNTER — Ambulatory Visit (INDEPENDENT_AMBULATORY_CARE_PROVIDER_SITE_OTHER): Payer: PRIVATE HEALTH INSURANCE

## 2018-10-24 VITALS — BP 110/76 | HR 79 | Temp 98.4°F | Ht 75.0 in | Wt 200.4 lb

## 2018-10-24 DIAGNOSIS — R0989 Other specified symptoms and signs involving the circulatory and respiratory systems: Secondary | ICD-10-CM | POA: Diagnosis not present

## 2018-10-24 DIAGNOSIS — J309 Allergic rhinitis, unspecified: Secondary | ICD-10-CM

## 2018-10-24 DIAGNOSIS — R059 Cough, unspecified: Secondary | ICD-10-CM

## 2018-10-24 DIAGNOSIS — R05 Cough: Secondary | ICD-10-CM | POA: Diagnosis not present

## 2018-10-24 DIAGNOSIS — J40 Bronchitis, not specified as acute or chronic: Secondary | ICD-10-CM

## 2018-10-24 DIAGNOSIS — Z23 Encounter for immunization: Secondary | ICD-10-CM

## 2018-10-24 MED ORDER — ALBUTEROL SULFATE HFA 108 (90 BASE) MCG/ACT IN AERS: 2.0000 | INHALATION_SPRAY | Freq: Four times a day (QID) | RESPIRATORY_TRACT | 0 refills | Status: DC | PRN

## 2018-10-24 MED ORDER — METHYLPREDNISOLONE 4 MG PO TBPK: ORAL_TABLET | ORAL | 0 refills | Status: DC

## 2018-10-24 NOTE — Progress Notes (Signed)
Subjective:    Patient ID: Adam Bishop, male    DOB: 26-Oct-1985, 33 y.o.   MRN: [id number]  HPI   Patient presents to clinic complaining of cough, chest congestion for about 2 weeks.  Patient states he did the ED visit through his insurance company around Christmas, was treated with a course of Omnicef due to possible sinus/ear infection and was advised to use Mucinex for his cough and congestion.  Patient states after taking Omnicef, he did feel better for couple of days, but then cough and chest congestion began to return.  Patient states he does notice that when he is around his coworkers to smoke, his cough seems worse.  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  He began taking Claritin 10 mg once daily about 2 weeks ago, has noticed this has helped to improve congestion symptoms.  Also states upon waking this morning, cough and chest congestion did feel better.  Denies fever or chills.  Denies nausea, vomiting or diarrhea.  Denies chest pain or palpitations.  Patient Active Problem List   Diagnosis Date Noted  . History of asthma 08/16/2018  . HLD (hyperlipidemia) 08/16/2018  . Thyroid nodule 11/25/2017  . Globus sensation 11/25/2017  . Other somatoform disorders   . Chronic LLQ pain 09/19/2015  . Visit for preventive health examination 06/07/2015  . GERD (gastroesophageal reflux disease) 11/07/2014  . Chest wall pain 08/28/2014  . Generalized anxiety disorder 08/28/2014   Social History   Tobacco Use  . Smoking status: Never Smoker  . Smokeless tobacco: Never Used  Substance Use Topics  . Alcohol use: Yes    Alcohol/week: 0.0 standard drinks    Comment: rare   Review of Systems  Constitutional: Negative for chills, fatigue and fever.  HENT: Negative for congestion, ear pain, sinus pain and sore throat.   Eyes: Negative.   Respiratory: Cough and chest congestion, some SOB - symptoms seem better today.      Cardiovascular: Negative for chest pain, palpitations and leg swelling.  Gastrointestinal: Negative for abdominal pain, diarrhea, nausea and vomiting.  Genitourinary: Negative for dysuria, frequency and urgency.  Musculoskeletal: Negative for arthralgias and myalgias.  Skin: Negative for color change, pallor and rash.  Neurological: Negative for syncope, light-headedness and headaches.  Psychiatric/Behavioral: The patient is not nervous/anxious.       Objective:   Physical Exam   Constitutional: He appears well-developed and well-nourished. No distress.  HENT:  Head: Normocephalic and atraumatic. Nose/throat: Mild postnasal drip and some clear nasal drainage. Eyes: Conjunctivae and EOM are normal. No scleral icterus.  Neck: Normal range of motion. Neck supple. No tracheal deviation present.  Cardiovascular: Normal rate, regular rhythm and normal heart sounds.  Pulmonary/Chest: Effort normal and breath sounds normal. No respiratory distress. He has no wheezes. He has no rales.  Neurological: He is alert and oriented to person, place, and time.  Gait normal  Skin: Skin is warm and dry. He is not diaphoretic. No pallor.  Psychiatric: He has a normal mood and affect. His behavior is normal. Thought content normal.   Nursing note and vitals reviewed.  Vitals:   10/24/18 0849  BP: 110/76  Pulse: 79  Temp: 98.4 F (36.9 C)  SpO2: 98%      Assessment & Plan:   Bronchitis, chest congestion, cough - due to length of time with symptoms off-and-on we will get chest x-ray in clinic to rule out any lingering pneumonia.  Patient symptoms have begun to improve on their own after using Mucinex.  Patient prescribed albuterol inhaler by me to use if needed for any breakthrough shortness of breath or wheezing.  Also will take steroid taper to help reduce inflammation that most likely is causing chest congestion and cough.  Advised to continue to get plenty of rest, do good handwashing, and avoid  any triggers that seem to affect his breathing.  Allergic rhinitis - patient advised to continue taking daily Claritin as it seems of helped allergy symptoms and it will continue to help dry up nasal drainage.  Patient requests flu vaccine today, he is afebrile and congestion and bronchitis symptoms are slowly improving on their own, so he will get flu vaccine today.  Patient will keep regularly scheduled follow-up with PCP as planned.  Advised to return to clinic sooner if any issues arise or if current symptoms persist or worsen.

## 2018-10-30 ENCOUNTER — Encounter: Payer: Self-pay | Admitting: Podiatry

## 2018-10-30 DIAGNOSIS — Z79899 Other long term (current) drug therapy: Secondary | ICD-10-CM

## 2018-10-30 DIAGNOSIS — M79675 Pain in left toe(s): Principal | ICD-10-CM

## 2018-10-30 DIAGNOSIS — B351 Tinea unguium: Secondary | ICD-10-CM

## 2018-10-31 NOTE — Telephone Encounter (Signed)
Dr. Stacie Acres reviewed pt's labs and states pt needs to make an appt to discuss the labs and options. I informed pt and transferred to schedulers.

## 2018-11-02 ENCOUNTER — Other Ambulatory Visit: Payer: Self-pay

## 2018-11-02 DIAGNOSIS — Z79899 Other long term (current) drug therapy: Secondary | ICD-10-CM

## 2018-11-02 MED ORDER — TERBINAFINE HCL 250 MG PO TABS: 250.0000 mg | ORAL_TABLET | Freq: Every day | ORAL | 2 refills | Status: DC

## 2018-11-02 NOTE — Telephone Encounter (Signed)
Per Dr. Stacie Acres, Patient notified to pick up rx for Liver function test.

## 2018-11-02 NOTE — Progress Notes (Signed)
Per Dr. Stacie Acres, Patient to pick up script for Hepatic function test and start Lamisil.  Patient has been notified about lab test and rx.  He verbally understood to not start medication until we call with results of LFT.   Script for Lamisil has been sent pharmacy

## 2018-11-02 NOTE — Progress Notes (Signed)
Patient will pick up script for LFT at front desk This encounter was created in error - please disregard.

## 2018-11-02 NOTE — Addendum Note (Signed)
Addended by: Geraldine Contras D on: 11/02/2018 03:33 PM   Modules accepted: Orders

## 2018-12-05 ENCOUNTER — Ambulatory Visit: Payer: PRIVATE HEALTH INSURANCE | Admitting: Internal Medicine

## 2018-12-05 ENCOUNTER — Encounter: Payer: Self-pay | Admitting: Internal Medicine

## 2018-12-05 VITALS — BP 134/80 | HR 79 | Temp 98.5°F | Ht 75.0 in | Wt 199.6 lb

## 2018-12-05 DIAGNOSIS — G8929 Other chronic pain: Secondary | ICD-10-CM

## 2018-12-05 DIAGNOSIS — M542 Cervicalgia: Secondary | ICD-10-CM | POA: Diagnosis not present

## 2018-12-05 DIAGNOSIS — M62838 Other muscle spasm: Secondary | ICD-10-CM | POA: Diagnosis not present

## 2018-12-05 DIAGNOSIS — S0340XA Sprain of jaw, unspecified side, initial encounter: Secondary | ICD-10-CM | POA: Diagnosis not present

## 2018-12-05 DIAGNOSIS — B351 Tinea unguium: Secondary | ICD-10-CM | POA: Diagnosis not present

## 2018-12-05 DIAGNOSIS — H6991 Unspecified Eustachian tube disorder, right ear: Secondary | ICD-10-CM

## 2018-12-05 DIAGNOSIS — M25511 Pain in right shoulder: Secondary | ICD-10-CM

## 2018-12-05 MED ORDER — CICLOPIROX 8 % EX SOLN: Freq: Every day | CUTANEOUS | 11 refills | Status: DC

## 2018-12-05 MED ORDER — CYCLOBENZAPRINE HCL 5 MG PO TABS: 5.0000 mg | ORAL_TABLET | Freq: Every evening | ORAL | 2 refills | Status: DC | PRN

## 2018-12-05 NOTE — Progress Notes (Signed)
Pre visit review using our clinic review tool, if applicable. No additional management support is needed unless otherwise documented below in the visit note. 

## 2018-12-05 NOTE — Patient Instructions (Addendum)
Eustachian Tube Dysfunction  Eustachian tube dysfunction refers to a condition in which a blockage develops in the narrow passage that connects the middle ear to the back of the nose (eustachian tube). The eustachian tube regulates air pressure in the middle ear by letting air move between the ear and nose. It also helps to drain fluid from the middle ear space. Eustachian tube dysfunction can affect one or both ears. When the eustachian tube does not function properly, air pressure, fluid, or both can build up in the middle ear. What are the causes? This condition occurs when the eustachian tube becomes blocked or cannot open normally. Common causes of this condition include:  Ear infections.  Colds and other infections that affect the nose, mouth, and throat (upper respiratory tract).  Allergies.  Irritation from cigarette smoke.  Irritation from stomach acid coming up into the esophagus (gastroesophageal reflux). The esophagus is the tube that carries food from the mouth to the stomach.  Sudden changes in air pressure, such as from descending in an airplane or scuba diving.  Abnormal growths in the nose or throat, such as: ? Growths that line the nose (nasal polyps). ? Abnormal growth of cells (tumors). ? Enlarged tissue at the back of the throat (adenoids). What increases the risk? You are more likely to develop this condition if:  You smoke.  You are overweight.  You are a child who has: ? Certain birth defects of the mouth, such as cleft palate. ? Large tonsils or adenoids. What are the signs or symptoms? Common symptoms of this condition include:  A feeling of fullness in the ear.  Ear pain.  Clicking or popping noises in the ear.  Ringing in the ear.  Hearing loss.  Loss of balance.  Dizziness. Symptoms may get worse when the air pressure around you changes, such as when you travel to an area of high elevation, fly on an airplane, or go scuba diving. How is  this diagnosed? This condition may be diagnosed based on:  Your symptoms.  A physical exam of your ears, nose, and throat.  Tests, such as those that measure: ? The movement of your eardrum (tympanogram). ? Your hearing (audiometry). How is this treated? Treatment depends on the cause and severity of your condition.  In mild cases, you may relieve your symptoms by moving air into your ears. This is called "popping the ears."  In more severe cases, or if you have symptoms of fluid in your ears, treatment may include: ? Medicines to relieve congestion (decongestants). ? Medicines that treat allergies (antihistamines). ? Nasal sprays or ear drops that contain medicines that reduce swelling (steroids). ? A procedure to drain the fluid in your eardrum (myringotomy). In this procedure, a small tube is placed in the eardrum to:  Drain the fluid.  Restore the air in the middle ear space. ? A procedure to insert a balloon device through the nose to inflate the opening of the eustachian tube (balloon dilation). Follow these instructions at home: Lifestyle  Do not do any of the following until your health care provider approves: ? Travel to high altitudes. ? Fly in airplanes. ? Work in a pressurized cabin or room. ? Scuba dive.  Do not use any products that contain nicotine or tobacco, such as cigarettes and e-cigarettes. If you need help quitting, ask your health care provider.  Keep your ears dry. Wear fitted earplugs during showering and bathing. Dry your ears completely after. General instructions  Take over-the-counter   and prescription medicines only as told by your health care provider.  Use techniques to help pop your ears as recommended by your health care provider. These may include: ? Chewing gum. ? Yawning. ? Frequent, forceful swallowing. ? Closing your mouth, holding your nose closed, and gently blowing as if you are trying to blow air out of your nose.  Keep all  follow-up visits as told by your health care provider. This is important. Contact a health care provider if:  Your symptoms do not go away after treatment.  Your symptoms come back after treatment.  You are unable to pop your ears.  You have: ? A fever. ? Pain in your ear. ? Pain in your head or neck. ? Fluid draining from your ear.  Your hearing suddenly changes.  You become very dizzy.  You lose your balance. Summary  Eustachian tube dysfunction refers to a condition in which a blockage develops in the eustachian tube.  It can be caused by ear infections, allergies, inhaled irritants, or abnormal growths in the nose or throat.  Symptoms include ear pain, hearing loss, or ringing in the ears.  Mild cases are treated with maneuvers to unblock the ears, such as yawning or ear popping.  Severe cases are treated with medicines. Surgery may also be done (rare). This information is not intended to replace advice given to you by your health care provider. Make sure you discuss any questions you have with your health care provider. Document Released: 10/31/2015 Document Revised: 01/24/2018 Document Reviewed: 01/24/2018 Elsevier Interactive Patient Education  2019 [employer].  Earache, Adult An earache, or ear pain, can be caused by many things, including:  An infection.  Ear wax buildup.  Ear pressure.  Something in the ear that should not be there (foreign body).  A sore throat.  Tooth problems.  Jaw problems. Treatment of the earache will depend on the cause. If the cause is not clear or cannot be determined, you may need to watch your symptoms until your earache goes away or until a cause is found. Follow these instructions at home: Pay attention to any changes in your symptoms. Take these actions to help with your pain:  Take or apply over-the-counter and prescription medicines only as told by your health care provider.  If you were prescribed an antibiotic  medicine, use it as told by your health care provider. Do not stop using the antibiotic even if you start to feel better.  Do not put anything in your ear other than medicine that is prescribed by your health care provider.  If directed, apply heat to the affected area as often as told by your health care provider. Use the heat source that your health care provider recommends, such as a moist heat pack or a heating pad. ? Place a towel between your skin and the heat source. ? Leave the heat on for 20-30 minutes. ? Remove the heat if your skin turns bright red. This is especially important if you are unable to feel pain, heat, or cold. You may have a greater risk of getting burned.  If directed, put ice on the ear: ? Put ice in a plastic bag. ? Place a towel between your skin and the bag. ? Leave the ice on for 20 minutes, 2-3 times a day.  Try resting in an upright position instead of lying down. This may help to reduce pressure in your ear and relieve pain.  Chew gum if it helps to relieve  your ear pain.  Treat any allergies as told by your health care provider.  Keep all follow-up visits as told by your health care provider. This is important. Contact a health care provider if:  Your pain does not improve within 2 days.  Your earache gets worse.  You have new symptoms.  You have a fever. Get help right away if:  You have a severe headache.  You have a stiff neck.  You have trouble swallowing.  You have redness or swelling behind your ear.  You have fluid or blood coming from your ear.  You have hearing loss.  You feel dizzy. This information is not intended to replace advice given to you by your health care provider. Make sure you discuss any questions you have with your health care provider. Document Released: 05/21/2004 Document Revised: 06/01/2016 Document Reviewed: 03/29/2016 Elsevier Interactive Patient Education  2019 [employer]  Temporomandibular Joint  Syndrome  Temporomandibular joint syndrome (TMJ syndrome) is a condition that causes pain in the temporomandibular joints. These joints are located near your ears and allow your jaw to open and close. For people with TMJ syndrome, chewing, biting, or other movements of the jaw can be difficult or painful. TMJ syndrome is often mild and goes away within a few weeks. However, sometimes the condition becomes a long-term (chronic) problem. What are the causes? This condition may be caused by:  Grinding your teeth or clenching your jaw. Some people do this when they are under stress.  Arthritis.  Injury to the jaw.  Head or neck injury.  Teeth or dentures that are not aligned well. In some cases, the cause of TMJ syndrome may not be known. What are the signs or symptoms? The most common symptom of this condition is an aching pain on the side of the head in the area of the TMJ. Other symptoms may include:  Pain when moving your jaw, such as when chewing or biting.  Being unable to open your jaw all the way.  Making a clicking sound when you open your mouth.  Headache.  Earache.  Neck or shoulder pain. How is this diagnosed? This condition may be diagnosed based on:  Your symptoms and medical history.  A physical exam. Your health care provider may check the range of motion of your jaw.  Imaging tests, such as X-rays or an MRI. You may also need to see your dentist, who will determine if your teeth and jaw are lined up correctly. How is this treated? TMJ syndrome often goes away on its own. If treatment is needed, the options may include:  Eating soft foods and applying ice or heat.  Medicines to relieve pain or inflammation.  Medicines or massage to relax the muscles.  A splint, bite plate, or mouthpiece to prevent teeth grinding or jaw clenching.  Relaxation techniques or counseling to help reduce stress.  A therapy for pain in which an electrical current is applied to  the nerves through the skin (transcutaneous electrical nerve stimulation).  Acupuncture. This is sometimes helpful to relieve pain.  Jaw surgery. This is rarely needed. Follow these instructions at home:  Eating and drinking  Eat a soft diet if you are having trouble chewing.  Avoid foods that require a lot of chewing. Do not chew gum. General instructions  Take over-the-counter and prescription medicines only as told by your health care provider.  If directed, put ice on the painful area. ? Put ice in a plastic bag. ? Place  a towel between your skin and the bag. ? Leave the ice on for 20 minutes, 2-3 times a day.  Apply a warm, wet cloth (warm compress) to the painful area as directed.  Massage your jaw area and do any jaw stretching exercises as told by your health care provider.  If you were given a splint, bite plate, or mouthpiece, wear it as told by your health care provider.  Keep all follow-up visits as told by your health care provider. This is important. Contact a health care provider if:  You are having trouble eating.  You have new or worsening symptoms. Get help right away if:  Your jaw locks open or closed. Summary  Temporomandibular joint syndrome (TMJ syndrome) is a condition that causes pain in the temporomandibular joints. These joints are located near your ears and allow your jaw to open and close.  TMJ syndrome is often mild and goes away within a few weeks. However, sometimes the condition becomes a long-term (chronic) problem.  Symptoms include an aching pain on the side of the head in the area of the TMJ, pain when chewing or biting, and being unable to open your jaw all the way. You may also make a clicking sound when you open your mouth.  TMJ syndrome often goes away on its own. If treatment is needed, it may include medicines to relieve pain, reduce inflammation, or relax the muscles. A splint, bite plate, or mouthpiece may also be used to prevent  teeth grinding or jaw clenching. This information is not intended to replace advice given to you by your health care provider. Make sure you discuss any questions you have with your health care provider. Document Released: 06/29/2001 Document Revised: 11/15/2017 Document Reviewed: 11/15/2017 Elsevier Interactive Patient Education  2019 [employer].

## 2018-12-05 NOTE — Progress Notes (Signed)
Chief Complaint  Patient presents with  . Acute Visit    right ear pain   F/u  1. C/o toenail fungus does not want to lamisil  2. C/o right jaw pain and ear pain x 2 weeks no fever tried debrox did not help tried claritin ears popping as well ENT is Dr. Redmond Baseman dentist stated he was grinding teeth and mouth guard started using again. He has tried ibuprofen 200 mg prn as well. H/o tubes in ears as kid  3. 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  HENT: Positive for ear pain. Negative for hearing loss.        Ears popping and pain  Right TMJ jt pain    Eyes: Negative for blurred vision.  Respiratory: Negative for shortness of breath.   Cardiovascular: Negative for chest pain.  Gastrointestinal: Negative for abdominal pain.  Musculoskeletal: Positive for neck pain.  Skin: Negative for rash.  Neurological: Negative for headaches.  Psychiatric/Behavioral: Negative for depression. The patient is nervous/anxious.    Past Medical History:  Diagnosis Date  . Anxiety   . Colon polyp    Benign, colonoscopy 2015  . GERD (gastroesophageal reflux disease)   . Globus sensation   . History of chicken pox   . Lymph node enlargement   . Wears contact lenses    Past Surgical History:  Procedure Laterality Date  . COLONOSCOPY WITH ESOPHAGOGASTRODUODENOSCOPY (EGD)    . ESOPHAGOGASTRODUODENOSCOPY (EGD) WITH PROPOFOL N/A 10/21/2017   Procedure: ESOPHAGOGASTRODUODENOSCOPY (EGD) WITH PROPOFOL;  Surgeon: Lucilla Lame, MD;  Location: [place];  Service: Endoscopy;  Laterality: N/A;  . FINGER SURGERY     fracture  . FLEXIBLE BRONCHOSCOPY N/A 05/29/2018   Procedure: FLEXIBLE BRONCHOSCOPY;  Surgeon: Laverle Hobby, MD;  Location: [facility];  Service: Pulmonary;  Laterality: N/A;  . TONSILLECTOMY    . WISDOM TOOTH  EXTRACTION     Family History  Problem Relation Age of Onset  . Thyroid disease Mother        Living  . Goiter Mother   . Cirrhosis Father 84       Deceased  . Alcohol abuse Father        liver cirrhosis   . Cancer Paternal Grandfather        Lung Cancer  . Cancer Maternal Grandfather   . Lymphoma Sister        Neck  . Melanoma Sister   . Skin cancer Sister    Social History   Socioeconomic History  . Marital status: Married    Spouse name: Not on file  . Number of children: Not on file  . Years of education: Not on file  . Highest education level: Not on file  Occupational History  . Occupation: Recruitment consultant  Social Needs  . Financial resource strain: Not on file  . Food insecurity:    Worry: Not on file    Inability: Not on file  . Transportation needs:    Medical: Not on file    Non-medical: Not on file  Tobacco Use  . Smoking status: Never Smoker  . Smokeless tobacco: Never Used  Substance and Sexual Activity  . Alcohol use: Yes    Alcohol/week: 0.0 standard drinks    Comment: rare  . Drug use: No  . Sexual activity: Yes  Partners: Female  Lifestyle  . Physical activity:    Days per week: Not on file    Minutes per session: Not on file  . Stress: Not on file  Relationships  . Social connections:    Talks on phone: Not on file    Gets together: Not on file    Attends religious service: Not on file    Active member of club or organization: Not on file    Attends meetings of clubs or organizations: Not on file    Relationship status: Not on file  . Intimate partner violence:    Fear of current or ex partner: Not on file    Emotionally abused: Not on file    Physically abused: Not on file    Forced sexual activity: Not on file  Other Topics Concern  . Not on file  Social History Narrative   Married    1 son 2 y.o as of 08/16/18    Works Mudlogger   No outpatient medications have been marked as taking for the 12/05/18  encounter (Office Visit) with McLean-Scocuzza, Nino Glow, MD.   No Known Allergies No results found for this or any previous visit (from the past 2160 hour(s)). Objective  Body mass index is 24.95 kg/m. Wt Readings from Last 3 Encounters:  12/05/18 199 lb 9.6 oz (90.5 kg)  10/24/18 200 lb 6.4 oz (90.9 kg)  08/16/18 196 lb 9.6 oz (89.2 kg)   Temp Readings from Last 3 Encounters:  12/05/18 98.5 F (36.9 C) (Oral)  10/24/18 98.4 F (36.9 C) (Oral)  08/16/18 98.3 F (36.8 C) (Oral)   BP Readings from Last 3 Encounters:  12/05/18 134/80  10/24/18 110/76  09/04/18 128/71   Pulse Readings from Last 3 Encounters:  12/05/18 79  10/24/18 79  09/04/18 84    Physical Exam Vitals signs and nursing note reviewed.  Constitutional:      Appearance: Normal appearance. He is well-developed and well-groomed.  HENT:     Head: Normocephalic and atraumatic.     Ears:     Comments: Fluid in b/l ears     Nose: Nose normal.     Mouth/Throat:     Mouth: Mucous membranes are moist.     Pharynx: Oropharynx is clear.  Eyes:     Conjunctiva/sclera: Conjunctivae normal.     Pupils: Pupils are equal, round, and reactive to light.  Cardiovascular:     Rate and Rhythm: Normal rate and regular rhythm.     Heart sounds: Normal heart sounds. No murmur.  Pulmonary:     Effort: Pulmonary effort is normal.     Breath sounds: Normal breath sounds.  Musculoskeletal:     Right shoulder: He exhibits spasm.     Comments: Pain in neck looking left    Skin:    General: Skin is warm and dry.  Neurological:     General: No focal deficit present.     Mental Status: He is alert and oriented to person, place, and time. Mental status is at baseline.     Gait: Gait normal.  Psychiatric:        Attention and Perception: Attention and perception normal.        Mood and Affect: Mood and affect normal.        Speech: Speech normal.        Behavior: Behavior normal. Behavior is cooperative.        Thought  Content: Thought content normal.  Cognition and Memory: Cognition and memory normal.        Judgment: Judgment normal.     Assessment   1. 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 Right neck pain and right shoulder pain and spasm  3. Onychomycosis  4. HM Plan   1. Flexeril 5 mg qhs prn tmj  rec oral decongestants prn cant tolerate nose sprays  Using prn claritin If continues f/u Dr. Redmond Baseman ent 2. Prn flexeril  Use night gard  If continues f/u with dentist appt 01/2019 consider Xray TMJ jt  3. penlac to try does not want to do oral lamisil  4.  Declines flu shot  Tdap had  Never had HPV shot Hep B immune Check MMR status labs this week   Saw derm 2-3 years agofrom 2018/2019 mom h/o BCC shoulder   Had EGD x 2 and colonoscopies x 2 in the past Dr. Carlean Jews Dr. Redmond Baseman ENT  Provider: Dr. Olivia Mackie McLean-Scocuzza-[facility]

## 2018-12-06 ENCOUNTER — Ambulatory Visit: Payer: PRIVATE HEALTH INSURANCE | Admitting: Family Medicine

## 2018-12-06 ENCOUNTER — Ambulatory Visit: Payer: PRIVATE HEALTH INSURANCE | Admitting: Internal Medicine

## 2018-12-07 ENCOUNTER — Other Ambulatory Visit: Payer: PRIVATE HEALTH INSURANCE

## 2018-12-13 ENCOUNTER — Other Ambulatory Visit (INDEPENDENT_AMBULATORY_CARE_PROVIDER_SITE_OTHER): Payer: PRIVATE HEALTH INSURANCE

## 2018-12-13 DIAGNOSIS — Z1159 Encounter for screening for other viral diseases: Secondary | ICD-10-CM

## 2018-12-13 DIAGNOSIS — Z0000 Encounter for general adult medical examination without abnormal findings: Secondary | ICD-10-CM

## 2018-12-13 DIAGNOSIS — Z1389 Encounter for screening for other disorder: Secondary | ICD-10-CM

## 2018-12-13 DIAGNOSIS — E559 Vitamin D deficiency, unspecified: Secondary | ICD-10-CM | POA: Diagnosis not present

## 2018-12-13 DIAGNOSIS — Z0184 Encounter for antibody response examination: Secondary | ICD-10-CM

## 2018-12-13 DIAGNOSIS — Z1329 Encounter for screening for other suspected endocrine disorder: Secondary | ICD-10-CM | POA: Diagnosis not present

## 2018-12-13 DIAGNOSIS — E785 Hyperlipidemia, unspecified: Secondary | ICD-10-CM | POA: Diagnosis not present

## 2018-12-13 LAB — CBC WITH DIFFERENTIAL/PLATELET
BASOS ABS: 0 10*3/uL (ref 0.0–0.1)
Basophils Relative: 0.4 % (ref 0.0–3.0)
EOS ABS: 0.1 10*3/uL (ref 0.0–0.7)
Eosinophils Relative: 1.9 % (ref 0.0–5.0)
HCT: 44.2 % (ref 39.0–52.0)
Hemoglobin: 15.3 g/dL (ref 13.0–17.0)
LYMPHS ABS: 1.9 10*3/uL (ref 0.7–4.0)
Lymphocytes Relative: 37.2 % (ref 12.0–46.0)
MCHC: 34.7 g/dL (ref 30.0–36.0)
MCV: 89.6 fl (ref 78.0–100.0)
Monocytes Absolute: 0.4 10*3/uL (ref 0.1–1.0)
Monocytes Relative: 8.3 % (ref 3.0–12.0)
NEUTROS ABS: 2.6 10*3/uL (ref 1.4–7.7)
NEUTROS PCT: 52.2 % (ref 43.0–77.0)
PLATELETS: 193 10*3/uL (ref 150.0–400.0)
RBC: 4.93 Mil/uL (ref 4.22–5.81)
RDW: 13 % (ref 11.5–15.5)
WBC: 5 10*3/uL (ref 4.0–10.5)

## 2018-12-13 LAB — LIPID PANEL
CHOL/HDL RATIO: 5
Cholesterol: 194 mg/dL (ref 0–200)
HDL: 38.6 mg/dL — AB (ref >39.00)
LDL CALC: 145 mg/dL — AB (ref 0–99)
NonHDL: 155.48
TRIGLYCERIDES: 54 mg/dL (ref 0.0–149.0)
VLDL: 10.8 mg/dL (ref 0.0–40.0)

## 2018-12-13 LAB — COMPREHENSIVE METABOLIC PANEL
ALT: 26 U/L (ref 0–53)
AST: 19 U/L (ref 0–37)
Albumin: 4.5 g/dL (ref 3.5–5.2)
Alkaline Phosphatase: 65 U/L (ref 39–117)
BILIRUBIN TOTAL: 0.8 mg/dL (ref 0.2–1.2)
BUN: 16 mg/dL (ref 6–23)
CO2: 29 meq/L (ref 19–32)
Calcium: 9.9 mg/dL (ref 8.4–10.5)
Chloride: 103 mEq/L (ref 96–112)
Creatinine, Ser: 0.8 mg/dL (ref 0.40–1.50)
GFR: 110.91 mL/min (ref >60.00)
GLUCOSE: 83 mg/dL (ref 70–99)
Potassium: 4.5 mEq/L (ref 3.5–5.1)
SODIUM: 141 meq/L (ref 135–145)
Total Protein: 7.1 g/dL (ref 6.0–8.3)

## 2018-12-13 NOTE — Addendum Note (Signed)
Addended by: Penne Lash on: 12/13/2018 08:12 AM   Modules accepted: Orders

## 2018-12-14 LAB — MEASLES/MUMPS/RUBELLA IMMUNITY
Mumps IgG: <9 AU/mL — ABNORMAL LOW
RUBEOLA IGG: 58 [AU]/ml

## 2018-12-14 LAB — TSH: TSH: 1.04 u[IU]/mL (ref 0.35–4.50)

## 2018-12-14 LAB — URINALYSIS, ROUTINE W REFLEX MICROSCOPIC
Bilirubin, UA: NEGATIVE
Glucose, UA: NEGATIVE
Ketones, UA: NEGATIVE
LEUKOCYTES UA: NEGATIVE
Nitrite, UA: NEGATIVE
PH UA: 6 (ref 5.0–7.5)
PROTEIN UA: NEGATIVE
RBC, UA: NEGATIVE
Specific Gravity, UA: 1.026 (ref 1.005–1.030)
UUROB: 0.2 mg/dL (ref 0.2–1.0)

## 2018-12-14 LAB — VITAMIN D 25 HYDROXY (VIT D DEFICIENCY, FRACTURES): VITD: 33.23 ng/mL (ref 30.00–100.00)

## 2018-12-20 ENCOUNTER — Ambulatory Visit: Payer: PRIVATE HEALTH INSURANCE | Admitting: Internal Medicine

## 2018-12-20 ENCOUNTER — Encounter: Payer: Self-pay | Admitting: Internal Medicine

## 2018-12-20 VITALS — BP 118/78 | HR 72 | Temp 98.3°F | Ht 75.0 in | Wt 200.2 lb

## 2018-12-20 DIAGNOSIS — G8929 Other chronic pain: Secondary | ICD-10-CM

## 2018-12-20 DIAGNOSIS — M62838 Other muscle spasm: Secondary | ICD-10-CM

## 2018-12-20 DIAGNOSIS — Z0000 Encounter for general adult medical examination without abnormal findings: Secondary | ICD-10-CM

## 2018-12-20 DIAGNOSIS — S46811A Strain of other muscles, fascia and tendons at shoulder and upper arm level, right arm, initial encounter: Secondary | ICD-10-CM | POA: Insufficient documentation

## 2018-12-20 DIAGNOSIS — R591 Generalized enlarged lymph nodes: Secondary | ICD-10-CM | POA: Insufficient documentation

## 2018-12-20 DIAGNOSIS — M25511 Pain in right shoulder: Secondary | ICD-10-CM

## 2018-12-20 DIAGNOSIS — S46811D Strain of other muscles, fascia and tendons at shoulder and upper arm level, right arm, subsequent encounter: Secondary | ICD-10-CM

## 2018-12-20 DIAGNOSIS — E785 Hyperlipidemia, unspecified: Secondary | ICD-10-CM

## 2018-12-20 DIAGNOSIS — S0340XA Sprain of jaw, unspecified side, initial encounter: Secondary | ICD-10-CM

## 2018-12-20 DIAGNOSIS — M542 Cervicalgia: Secondary | ICD-10-CM

## 2018-12-20 MED ORDER — CYCLOBENZAPRINE HCL 10 MG PO TABS: 10.0000 mg | ORAL_TABLET | Freq: Every evening | ORAL | 5 refills | Status: DC | PRN

## 2018-12-20 NOTE — Patient Instructions (Addendum)
Ref. Range 12/13/2018 08:12  Rubella Latest Units: index <0.90 (L)  Mumps IgG Latest Units: AU/mL <9.00 (L)  Rubeola IgG Latest Units: AU/mL 58.00    rec MMR vaccine at pharmacy    Duloxetine delayed-release  20 mg lowest dose  What is this medicine? DULOXETINE (doo LOX e teen) is used to treat depression, anxiety, and different types of chronic pain. This medicine may be used for other purposes; ask your health care provider or pharmacist if you have questions. COMMON BRAND NAME(S): Cymbalta, Irenka What should I tell my health care provider before I take this medicine? They need to know if you have any of these conditions: -bipolar disorder or a family history of bipolar disorder -glaucoma -kidney disease -liver disease -suicidal thoughts or a previous suicide attempt -taken medicines called MAOIs like Carbex, Eldepryl, Marplan, Nardil, and Parnate within 14 days -an unusual reaction to duloxetine, other medicines, foods, dyes, or preservatives -pregnant or trying to get pregnant -breast-feeding How should I use this medicine? Take this medicine by mouth with a glass of water. Follow the directions on the prescription label. Do not cut, crush or chew this medicine. You can take this medicine with or without food. Take your medicine at regular intervals. Do not take your medicine more often than directed. Do not stop taking this medicine suddenly except upon the advice of your doctor. Stopping this medicine too quickly may cause serious side effects or your condition may worsen. A special MedGuide will be given to you by the pharmacist with each prescription and refill. Be sure to read this information carefully each time. Talk to your pediatrician regarding the use of this medicine in children. While this drug may be prescribed for children as young as 65 years of age for selected conditions, precautions do apply. Overdosage: If you think you have taken too much of this medicine  contact a poison control center or emergency room at once. NOTE: This medicine is only for you. Do not share this medicine with others. What if I miss a dose? If you miss a dose, take it as soon as you can. If it is almost time for your next dose, take only that dose. Do not take double or extra doses. What may interact with this medicine? Do not take this medicine with any of the following medications: -desvenlafaxine -levomilnacipran -linezolid -MAOIs like Carbex, Eldepryl, Marplan, Nardil, and Parnate -methylene blue (injected into a vein) -milnacipran -thioridazine -venlafaxine This medicine may also interact with the following medications: -alcohol -amphetamines -aspirin and aspirin-like medicines -certain antibiotics like ciprofloxacin and enoxacin -certain medicines for blood pressure, heart disease, irregular heart beat -certain medicines for depression, anxiety, or psychotic disturbances -certain medicines for migraine headache like almotriptan, eletriptan, frovatriptan, naratriptan, rizatriptan, sumatriptan, zolmitriptan -certain medicines that treat or prevent blood clots like warfarin, enoxaparin, and dalteparin -cimetidine -fentanyl -lithium -NSAIDS, medicines for pain and inflammation, like ibuprofen or naproxen -phentermine -procarbazine -rasagiline -sibutramine -St. John's wort -theophylline -tramadol -tryptophan This list may not describe all possible interactions. Give your health care provider a list of all the medicines, herbs, non-prescription drugs, or dietary supplements you use. Also tell them if you smoke, drink alcohol, or use illegal drugs. Some items may interact with your medicine. What should I watch for while using this medicine? Tell your doctor if your symptoms do not get better or if they get worse. Visit your doctor or health care professional for regular checks on your progress. Because it may take several weeks to  see the full effects of this  medicine, it is important to continue your treatment as prescribed by your doctor. Patients and their families should watch out for new or worsening thoughts of suicide or depression. Also watch out for sudden changes in feelings such as feeling anxious, agitated, panicky, irritable, hostile, aggressive, impulsive, severely restless, overly excited and hyperactive, or not being able to sleep. If this happens, especially at the beginning of treatment or after a change in dose, call your health care professional. Dennis Bast may get drowsy or dizzy. Do not drive, use machinery, or do anything that needs mental alertness until you know how this medicine affects you. Do not stand or sit up quickly, especially if you are an older patient. This reduces the risk of dizzy or fainting spells. Alcohol may interfere with the effect of this medicine. Avoid alcoholic drinks. This medicine can cause an increase in blood pressure. This medicine can also cause a sudden drop in your blood pressure, which may make you feel faint and increase the chance of a fall. These effects are most common when you first start the medicine or when the dose is increased, or during use of other medicines that can cause a sudden drop in blood pressure. Check with your doctor for instructions on monitoring your blood pressure while taking this medicine. Your mouth may get dry. Chewing sugarless gum or sucking hard candy, and drinking plenty of water may help. Contact your doctor if the problem does not go away or is severe. What side effects may I notice from receiving this medicine? Side effects that you should report to your doctor or health care professional as soon as possible: -allergic reactions like skin rash, itching or hives, swelling of the face, lips, or tongue -anxious -breathing problems -confusion -changes in vision -chest pain -confusion -elevated mood, decreased need for sleep, racing thoughts, impulsive behavior -eye  pain -fast, irregular heartbeat -feeling faint or lightheaded, falls -feeling agitated, angry, or irritable -hallucination, loss of contact with reality -high blood pressure -loss of balance or coordination -palpitations -redness, blistering, peeling or loosening of the skin, including inside the mouth -restlessness, pacing, inability to keep still -seizures -stiff muscles -suicidal thoughts or other mood changes -trouble passing urine or change in the amount of urine -trouble sleeping -unusual bleeding or bruising -unusually weak or tired -vomiting -yellowing of the eyes or skin Side effects that usually do not require medical attention (report to your doctor or health care professional if they continue or are bothersome): -change in sex drive or performance -change in appetite or weight -constipation -dizziness -dry mouth -headache -increased sweating -nausea -tired This list may not describe all possible side effects. Call your doctor for medical advice about side effects. You may report side effects to FDA at 1-800-FDA-1088. Where should I keep my medicine? Keep out of the reach of children. Store at room temperature between 20 and 25 degrees C (68 to 77 degrees F). Throw away any unused medicine after the expiration date. NOTE: This sheet is a summary. It may not cover all possible information. If you have questions about this medicine, talk to your doctor, pharmacist, or health care provider.  2019 Elsevier/Gold Standard (2016-03-04 18:16:03)    Cervical Sprain  A cervical sprain is a stretch or tear in one or more of the tough, cord-like tissues that connect bones (ligaments) in the neck. Cervical sprains can range from mild to severe. Severe cervical sprains can cause the spinal bones (vertebrae) in the neck to  be unstable. This can lead to spinal cord damage and can result in serious nervous system problems. The amount of time that it takes for a cervical sprain to  get better depends on the cause and extent of the injury. Most cervical sprains heal in 4-6 weeks. What are the causes? Cervical sprains may be caused by an injury (trauma), such as from a motor vehicle accident, a fall, or sudden forward and backward whipping movement of the head and neck (whiplash injury). Mild cervical sprains may be caused by wear and tear over time, such as from poor posture, sitting in a chair that does not provide support, or looking up or down for long periods of time. What increases the risk? The following factors may make you more likely to develop this condition:  Participating in activities that have a high risk of trauma to the neck. These include contact sports, auto racing, gymnastics, and diving.  Taking risks when driving or riding in a motor vehicle, such as speeding.  Having osteoarthritis of the spine.  Having poor strength and flexibility of the neck.  A previous neck injury.  Having poor posture.  Spending a lot of time in certain positions that put stress on the neck, such as sitting at a computer for long periods of time. What are the signs or symptoms? Symptoms of this condition include:  Pain, soreness, stiffness, tenderness, swelling, or a burning sensation in the front, back, or sides of the neck.  Sudden tightening of neck muscles that you cannot control (muscle spasms).  Pain in the shoulders or upper back.  Limited ability to move the neck.  Headache.  Dizziness.  Nausea.  Vomiting.  Weakness, numbness, or tingling in a hand or an arm. Symptoms may develop right away after injury, or they may develop over a few days. In some cases, symptoms may go away with treatment and return (recur) over time. How is this diagnosed? This condition may be diagnosed based on:  Your medical history.  Your symptoms.  Any recent injuries or known neck problems that you have, such as arthritis in the neck.  A physical exam.  Imaging  tests, such as: ? X-rays. ? MRI. ? CT scan. How is this treated? This condition is treated by resting and icing the injured area and doing physical therapy exercises. Depending on the severity of your condition, treatment may also include:  Keeping your neck in place (immobilized) for periods of time. This may be done using: ? A cervical collar. This supports your chin and the back of your head. ? A cervical traction device. This is a sling that holds up your head. This removes weight and pressure from your neck, and it may help to relieve pain.  Medicines that help to relieve pain and inflammation.  Medicines that help to relax your muscles (muscle relaxants).  Surgery. This is rare. Follow these instructions at home: If you have a cervical collar:   Wear it as told by your health care provider. Do not remove the collar unless instructed by your health care provider.  Ask your health care provider before you make any adjustments to your collar.  If you have long hair, keep it outside of the collar.  Ask your health care provider if you can remove the collar for cleaning and bathing. If you are allowed to remove the collar for cleaning or bathing: ? Follow instructions from your health care provider about how to remove the collar safely. ? Clean the  collar by wiping it with mild soap and water and drying it completely. ? If your collar has removable pads, remove them every 1-2 days and wash them by hand with soap and water. Let them air-dry completely before you put them back in the collar. ? Check your skin under the collar for irritation or sores. If you see any, tell your health care provider. Managing pain, stiffness, and swelling   If directed, use a cervical traction device as told by your health care provider.  If directed, apply heat to the affected area before you do your physical therapy or as often as told by your health care provider. Use the heat source that your  health care provider recommends, such as a moist heat pack or a heating pad. ? Place a towel between your skin and the heat source. ? Leave the heat on for 20-30 minutes. ? Remove the heat if your skin turns bright red. This is especially important if you are unable to feel pain, heat, or cold. You may have a greater risk of getting burned.  If directed, put ice on the affected area: ? Put ice in a plastic bag. ? Place a towel between your skin and the bag. ? Leave the ice on for 20 minutes, 2-3 times a day. Activity  Do not drive while wearing a cervical collar. If you do not have a cervical collar, ask your health care provider if it is safe to drive while your neck heals.  Do not drive or use heavy machinery while taking prescription pain medicine or muscle relaxants, unless your health care provider approves.  Do not lift anything that is heavier than 10 lb (4.5 kg) until your health care provider tells you that it is safe.  Rest as directed by your health care provider. Avoid positions and activities that make your symptoms worse. Ask your health care provider what activities are safe for you.  If physical therapy was prescribed, do exercises as told by your health care provider or physical therapist. General instructions  Take over-the-counter and prescription medicines only as told by your health care provider.  Do not use any products that contain nicotine or tobacco, such as cigarettes and e-cigarettes. These can delay healing. If you need help quitting, ask your health care provider.  Keep all follow-up visits as told by your health care provider or physical therapist. This is important. How is this prevented? To prevent a cervical sprain from happening again:  Use and maintain good posture. Make any needed adjustments to your workstation to help you use good posture.  Exercise regularly as directed by your health care provider or physical therapist.  Avoid risky  activities that may cause a cervical sprain. Contact a health care provider if:  You have symptoms that get worse or do not get better after 2 weeks of treatment.  You have pain that gets worse or does not get better with medicine.  You develop new, unexplained symptoms.  You have sores or irritated skin on your neck from wearing your cervical collar. Get help right away if:  You have severe pain.  You develop numbness, tingling, or weakness in any part of your body.  You cannot move a part of your body (you have paralysis).  You have neck pain along with: ? Severe dizziness. ? Headache. Summary  A cervical sprain is a stretch or tear in one or more of the tough, cord-like tissues that connect bones (ligaments) in the neck.  Cervical sprains may be caused by an injury (trauma), such as from a motor vehicle accident, a fall, or sudden forward and backward whipping movement of the head and neck (whiplash injury).  Symptoms may develop right away after injury, or they may develop over a few days.  This condition is treated by resting and icing the injured area and doing physical therapy exercises. This information is not intended to replace advice given to you by your health care provider. Make sure you discuss any questions you have with your health care provider. Document Released: 08/01/2007 Document Revised: 06/02/2016 Document Reviewed: 06/02/2016 Elsevier Interactive Patient Education  2019 [employer].

## 2018-12-20 NOTE — Progress Notes (Addendum)
Chief Complaint  Patient presents with  . Follow-up    neck pain    Annual  1. Right lateral neck pain x months and right side larger than left tried massage pt worried about lymph nodes right clavicle, neck area 2 2. TMJ pain resolved with Flexeril 5 mg qhs and mouth guard  3. Reviewed labs LDL 145 and needs MMR vaccine  Review of Systems  Constitutional: Negative for weight loss.  HENT: Negative for hearing loss.   Eyes: Negative for blurred vision.  Respiratory: Negative for shortness of breath.   Cardiovascular: Negative for chest pain.  Gastrointestinal: Negative for abdominal pain.  Musculoskeletal: Positive for joint pain and neck pain.  Skin: Negative for rash.  Neurological: Negative for headaches.  Endo/Heme/Allergies:       +lymphadenopathy   Psychiatric/Behavioral: Negative for depression.   Past Medical History:  Diagnosis Date  . Anxiety   . Colon polyp    Benign, colonoscopy 2015  . GERD (gastroesophageal reflux disease)   . Globus sensation   . History of chicken pox   . Lymph node enlargement   . Wears contact lenses    Past Surgical History:  Procedure Laterality Date  . COLONOSCOPY WITH ESOPHAGOGASTRODUODENOSCOPY (EGD)    . ESOPHAGOGASTRODUODENOSCOPY (EGD) WITH PROPOFOL N/A 10/21/2017   Procedure: ESOPHAGOGASTRODUODENOSCOPY (EGD) WITH PROPOFOL;  Surgeon: Lucilla Lame, MD;  Location: [facility];  Service: Endoscopy;  Laterality: N/A;  . FINGER SURGERY     fracture  . FLEXIBLE BRONCHOSCOPY N/A 05/29/2018   Procedure: FLEXIBLE BRONCHOSCOPY;  Surgeon: Laverle Hobby, MD;  Location: [facility];  Service: Pulmonary;  Laterality: N/A;  . TONSILLECTOMY    . WISDOM TOOTH EXTRACTION     Family History  Problem Relation Age of Onset  . Thyroid disease Mother        Living  . Goiter Mother   . Cirrhosis Father 49       Deceased  . Alcohol abuse Father        liver cirrhosis   . Cancer Paternal Grandfather        Lung Cancer  . Cancer  Maternal Grandfather   . Lymphoma Sister        Neck  . Melanoma Sister   . Skin cancer Sister    Social History   Socioeconomic History  . Marital status: Married    Spouse name: Not on file  . Number of children: Not on file  . Years of education: Not on file  . Highest education level: Not on file  Occupational History  . Occupation: Recruitment consultant  Social Needs  . Financial resource strain: Not on file  . Food insecurity:    Worry: Not on file    Inability: Not on file  . Transportation needs:    Medical: Not on file    Non-medical: Not on file  Tobacco Use  . Smoking status: Never Smoker  . Smokeless tobacco: Never Used  Substance and Sexual Activity  . Alcohol use: Yes    Alcohol/week: 0.0 standard drinks    Comment: rare  . Drug use: No  . Sexual activity: Yes    Partners: Female  Lifestyle  . Physical activity:    Days per week: Not on file    Minutes per session: Not on file  . Stress: Not on file  Relationships  . Social connections:    Talks on phone: Not on file    Gets together: Not on file    Attends religious  service: Not on file    Active member of club or organization: Not on file    Attends meetings of clubs or organizations: Not on file    Relationship status: Not on file  . Intimate partner violence:    Fear of current or ex partner: Not on file    Emotionally abused: Not on file    Physically abused: Not on file    Forced sexual activity: Not on file  Other Topics Concern  . Not on file  Social History Narrative   Married    1 son 2 y.o as of 08/16/18    Works Mudlogger   Current Meds  Medication Sig  . ciclopirox (PENLAC) 8 % solution Apply topically at bedtime. Apply over nail and surrounding skin. Apply daily over previous coat. After seven (7) days, may remove with alcohol and continue cycle. Can use up to 48 weeks  . cyclobenzaprine (FLEXERIL) 5 MG tablet Take 1 tablet (5 mg total) by mouth at bedtime as  needed for muscle spasms.   No Known Allergies Recent Results (from the past 2160 hour(s))  Measles/Mumps/Rubella Immunity     Status: Abnormal   Collection Time: 12/13/18  8:12 AM  Result Value Ref Range   Rubeola IgG 58.00 AU/mL    Comment: AU/mL            Interpretation -----            -------------- <13.50           Negative 13.50-16.49      Equivocal >16.49           Positive . A positive result indicates that the patient has antibody to measles virus. It does not differentiate  between an active or past infection. The clinical  diagnosis must be interpreted in conjunction with  clinical signs and symptoms of the patient.    Mumps IgG <9.00 (L) AU/mL    Comment:  AU/mL           Interpretation -------         ---------------- <9.00             Negative 9.00-10.99        Equivocal >10.99            Positive A positive result indicates that the patient has  antibody to mumps virus. It does not differentiate between an  active or past infection. The clinical diagnosis must be interpreted in conjunction with clinical signs and symptoms of the patient. .    Rubella <0.90 (L) index    Comment:     Index            Interpretation     -----            --------------       <0.90            Not consistent with Immunity     0.90-0.99        Equivocal     > or = 1.00      Consistent with Immunity  . The presence of rubella IgG antibody suggests  immunization or past or current infection with rubella virus.   Vitamin D (25 hydroxy)     Status: None   Collection Time: 12/13/18  8:12 AM  Result Value Ref Range   VITD 33.23 30.00 - 100.00 ng/mL  TSH     Status: None   Collection Time: 12/13/18  8:12 AM  Result Value Ref Range   TSH 1.04 0.35 - 4.50 uIU/mL  Lipid panel     Status: Abnormal   Collection Time: 12/13/18  8:12 AM  Result Value Ref Range   Cholesterol 194 0 - 200 mg/dL    Comment: ATP III Classification       Desirable:  < 200 mg/dL               Borderline  High:  200 - 239 mg/dL          High:  > = 240 mg/dL   Triglycerides 54.0 0.0 - 149.0 mg/dL    Comment: Normal:  <150 mg/dLBorderline High:  150 - 199 mg/dL   HDL 38.60 (L) >39.00 mg/dL   VLDL 10.8 0.0 - 40.0 mg/dL   LDL Cholesterol 145 (H) 0 - 99 mg/dL   Total CHOL/HDL Ratio 5     Comment:                Men          Women1/2 Average Risk     3.4          3.3Average Risk          5.0          4.42X Average Risk          9.6          7.13X Average Risk          15.0          11.0                       NonHDL 155.48     Comment: NOTE:  Non-HDL goal should be 30 mg/dL higher than patient's LDL goal (i.e. LDL goal of < 70 mg/dL, would have non-HDL goal of < 100 mg/dL)  CBC w/Diff     Status: None   Collection Time: 12/13/18  8:12 AM  Result Value Ref Range   WBC 5.0 4.0 - 10.5 K/uL   RBC 4.93 4.22 - 5.81 Mil/uL   Hemoglobin 15.3 13.0 - 17.0 g/dL   HCT 44.2 39.0 - 52.0 %   MCV 89.6 78.0 - 100.0 fl   MCHC 34.7 30.0 - 36.0 g/dL   RDW 13.0 11.5 - 15.5 %   Platelets 193.0 150.0 - 400.0 K/uL   Neutrophils Relative % 52.2 43.0 - 77.0 %   Lymphocytes Relative 37.2 12.0 - 46.0 %   Monocytes Relative 8.3 3.0 - 12.0 %   Eosinophils Relative 1.9 0.0 - 5.0 %   Basophils Relative 0.4 0.0 - 3.0 %   Neutro Abs 2.6 1.4 - 7.7 K/uL   Lymphs Abs 1.9 0.7 - 4.0 K/uL   Monocytes Absolute 0.4 0.1 - 1.0 K/uL   Eosinophils Absolute 0.1 0.0 - 0.7 K/uL   Basophils Absolute 0.0 0.0 - 0.1 K/uL  Comprehensive metabolic panel     Status: None   Collection Time: 12/13/18  8:12 AM  Result Value Ref Range   Sodium 141 135 - 145 mEq/L   Potassium 4.5 3.5 - 5.1 mEq/L   Chloride 103 96 - 112 mEq/L   CO2 29 19 - 32 mEq/L   Glucose, Bld 83 70 - 99 mg/dL   BUN 16 6 - 23 mg/dL   Creatinine, Ser 0.80 0.40 - 1.50 mg/dL   Total Bilirubin 0.8 0.2 - 1.2 mg/dL   Alkaline Phosphatase 65 39 - 117 U/L   AST 19  0 - 37 U/L   ALT 26 0 - 53 U/L   Total Protein 7.1 6.0 - 8.3 g/dL   Albumin 4.5 3.5 - 5.2 g/dL   Calcium 9.9 8.4 -  10.5 mg/dL   GFR 110.91 >60.00 mL/min  Urinalysis, Routine w reflex microscopic     Status: None   Collection Time: 12/13/18  8:12 AM  Result Value Ref Range   Specific Gravity, UA 1.026 1.005 - 1.030   pH, UA 6.0 5.0 - 7.5   Color, UA Yellow Yellow   Appearance Ur Clear Clear   Leukocytes, UA Negative Negative   Protein, UA Negative Negative/Trace   Glucose, UA Negative Negative   Ketones, UA Negative Negative   RBC, UA Negative Negative   Bilirubin, UA Negative Negative   Urobilinogen, Ur 0.2 0.2 - 1.0 mg/dL   Nitrite, UA Negative Negative   Microscopic Examination Comment     Comment: Microscopic not indicated and not performed.   Objective  Body mass index is 25.02 kg/m. Wt Readings from Last 3 Encounters:  12/20/18 200 lb 3.2 oz (90.8 kg)  12/05/18 199 lb 9.6 oz (90.5 kg)  10/24/18 200 lb 6.4 oz (90.9 kg)   Temp Readings from Last 3 Encounters:  12/20/18 98.3 F (36.8 C) (Oral)  12/05/18 98.5 F (36.9 C) (Oral)  10/24/18 98.4 F (36.9 C) (Oral)   BP Readings from Last 3 Encounters:  12/20/18 118/78  12/05/18 134/80  10/24/18 110/76   Pulse Readings from Last 3 Encounters:  12/20/18 72  12/05/18 79  10/24/18 79    Physical Exam Vitals signs and nursing note reviewed.  Constitutional:      Appearance: Normal appearance. He is well-developed and well-groomed.  HENT:     Head: Normocephalic and atraumatic.     Nose: Nose normal.     Mouth/Throat:     Mouth: Mucous membranes are moist.     Pharynx: Oropharynx is clear.  Eyes:     Conjunctiva/sclera: Conjunctivae normal.     Pupils: Pupils are equal, round, and reactive to light.  Cardiovascular:     Rate and Rhythm: Normal rate and regular rhythm.     Heart sounds: Normal heart sounds. No murmur.  Pulmonary:     Effort: Pulmonary effort is normal.     Breath sounds: Normal breath sounds.  Lymphadenopathy:     Cervical: Cervical adenopathy present.     Comments: ? Cervical adenopathy right side  ?  LAN right clavicle   Skin:    General: Skin is warm and dry.  Neurological:     General: No focal deficit present.     Mental Status: He is alert and oriented to person, place, and time. Mental status is at baseline.     Gait: Gait normal.  Psychiatric:        Attention and Perception: Attention and perception normal.        Mood and Affect: Mood and affect normal.        Speech: Speech normal.        Behavior: Behavior normal. Behavior is cooperative.        Thought Content: Thought content normal.        Cognition and Memory: Cognition and memory normal.        Judgment: Judgment normal.     Assessment   1. Right lateral neck pain 2. LAN cervical ? Right clavicular  3. HLD  4. Annual  Plan   1.refer to [facility]  flexeril 10 mg qhs prn  2. US soft tissue neck  3. rec healthy diet and exercise  4.  Flu shot had 10/24/2018  Tdap had 07/18/12  Never had HPV shot Hep B immune MMR rec given Rx today   Saw derm 2-3 years agofrom 2018/2019 mom h/o BCC shoulder   Had EGD x 2 and colonoscopies x 2 in the past Dr. Allen Norris  Sees Dr. Redmond Baseman ENT rec vitamin D3 otc 2000 IU daily   Provider: Dr. Olivia Mackie McLean-Scocuzza-Internal Medicine

## 2018-12-26 ENCOUNTER — Encounter: Payer: Self-pay | Admitting: Internal Medicine

## 2019-01-01 ENCOUNTER — Other Ambulatory Visit: Payer: Self-pay

## 2019-01-01 ENCOUNTER — Ambulatory Visit
Admission: RE | Admit: 2019-01-01 | Discharge: 2019-01-01 | Disposition: A | Discharge status: Home / Self Care | Payer: PRIVATE HEALTH INSURANCE | Source: Ambulatory Visit | Attending: Internal Medicine | Admitting: Internal Medicine

## 2019-01-01 DIAGNOSIS — R591 Generalized enlarged lymph nodes: Secondary | ICD-10-CM | POA: Diagnosis present

## 2019-01-02 ENCOUNTER — Encounter: Payer: Self-pay | Admitting: Internal Medicine

## 2019-02-21 ENCOUNTER — Encounter: Payer: Self-pay | Admitting: Podiatry

## 2019-02-22 MED ORDER — TERBINAFINE HCL 250 MG PO TABS: 250.0000 mg | ORAL_TABLET | Freq: Every day | ORAL | 2 refills | Status: DC

## 2019-02-27 ENCOUNTER — Encounter (INDEPENDENT_AMBULATORY_CARE_PROVIDER_SITE_OTHER): Payer: PRIVATE HEALTH INSURANCE | Admitting: Internal Medicine

## 2019-02-27 DIAGNOSIS — W57XXXA Bitten or stung by nonvenomous insect and other nonvenomous arthropods, initial encounter: Secondary | ICD-10-CM | POA: Diagnosis not present

## 2019-02-27 DIAGNOSIS — S40869A Insect bite (nonvenomous) of unspecified upper arm, initial encounter: Secondary | ICD-10-CM

## 2019-02-28 MED ORDER — DOXYCYCLINE HYCLATE 100 MG PO TABS: 100.0000 mg | ORAL_TABLET | Freq: Two times a day (BID) | ORAL | 0 refills | Status: DC

## 2019-02-28 NOTE — Telephone Encounter (Signed)
Please let me know if you are able to send it in. Thank you  ----- Message -----  From: Pasty Spillers McLean-Scocuzza, MD  Sent: 02/27/2019 5:24 PM EDT  To: [name]  Subject: RE: Non-Urgent Medical Question  I would recommend Doxycycline which is an antibiotic 2x per day for 10 days   If you are agreeable, and I send this in we are charging/billing insurance for my chart messages where we discuss a new issue    Would you like me to send in Doxycycline?   If so you take it 2x per day with food x 10 days and if not better let me know   Be safe and wear a mask in public and at work     [employer]      ----- Message -----   From: [name]   Sent: 02/27/2019 1:44 PM EDT    To: Pasty Spillers McLean-Scocuzza, MD  Subject: RE: Non-Urgent Medical Question    Hi. Wanted to reach out it has been years since I've had a tick bite but I had one attach under my arm put area for what had to be no more or less than two hours on Sunday around 4:00pm. Today it is still swollen and has a redness developed around it. Very sore around the area and has a knot like feeling. We did get the whole tick out. It's in my tattoo so kind of hard to see on picture. Not sure if this is Normal or something I should need to take something for or be seen? Please let me know your thoughts. Thanks    Sent doxycycline 100 mg 2x per day for tick bite   My chart message note  Time spent 5 minutes  TMS

## 2019-03-20 ENCOUNTER — Telehealth: Payer: Self-pay | Admitting: Radiology

## 2019-03-20 NOTE — Telephone Encounter (Signed)
LMOM to return call and reschedule 03/21/19 appointment with Dr. Magnus Ivan.  Attempt to reschedule for next week per Dr. Magnus Ivan.

## 2019-03-21 ENCOUNTER — Ambulatory Visit: Payer: PRIVATE HEALTH INSURANCE | Admitting: Orthopaedic Surgery

## 2019-03-26 ENCOUNTER — Ambulatory Visit: Payer: PRIVATE HEALTH INSURANCE | Admitting: Orthopaedic Surgery

## 2019-04-09 ENCOUNTER — Ambulatory Visit (INDEPENDENT_AMBULATORY_CARE_PROVIDER_SITE_OTHER): Payer: PRIVATE HEALTH INSURANCE | Admitting: Orthopaedic Surgery

## 2019-04-09 ENCOUNTER — Encounter: Payer: Self-pay | Admitting: Orthopaedic Surgery

## 2019-04-09 ENCOUNTER — Other Ambulatory Visit: Payer: Self-pay

## 2019-04-09 ENCOUNTER — Ambulatory Visit (INDEPENDENT_AMBULATORY_CARE_PROVIDER_SITE_OTHER): Payer: PRIVATE HEALTH INSURANCE

## 2019-04-09 VITALS — Ht 75.0 in | Wt 195.0 lb

## 2019-04-09 DIAGNOSIS — M5432 Sciatica, left side: Secondary | ICD-10-CM | POA: Diagnosis not present

## 2019-04-09 DIAGNOSIS — M545 Low back pain, unspecified: Secondary | ICD-10-CM

## 2019-04-09 MED ORDER — METHYLPREDNISOLONE 4 MG PO TABS
ORAL_TABLET | ORAL | 0 refills | Status: DC
Start: 2019-04-09 — End: 2019-08-07

## 2019-04-09 NOTE — Progress Notes (Signed)
Office Visit Note   Patient: Adam Bishop           Date of Birth: [birth year]           MRN: [id number] Visit Date: 04/09/2019              Requested by: McLean-Scocuzza, Nino Glow, MD [place],  [place] [address] PCP: McLean-Scocuzza, Nino Glow, MD   Assessment & Plan: Visit Diagnoses:  1. Acute left-sided low back pain, unspecified whether sciatica present   2. Sciatica, left side     Plan: The majority of what he describes seems to be sciatica.  There is a component of trochanteric bursitis.  I showed him a lot of back extension exercises as well as hip stretching exercises that I wonder try twice daily.  I will try a 6-day steroid taper as well as have him take Aleve twice a day and try some Voltaren gel.  We will see him back in about 4 weeks to see how he is doing overall.  All question concerns were answered and addressed.  Follow-Up Instructions: Return in about 4 weeks (around 05/07/2019).   Orders:  Orders Placed This Encounter  Procedures  . XR Lumbar Spine 2-3 Views   Meds ordered this encounter  Medications  . methylPREDNISolone (MEDROL) 4 MG tablet    Sig: Medrol dose pack. Take as instructed    Dispense:  21 tablet    Refill:  0      Procedures: No procedures performed   Clinical Data: No additional findings.   Subjective: Chief Complaint  Patient presents with  . Lower Back - Pain  . Left Hip - Pain  Patient is a very pleasant active 33 year old thin gentleman has been dealing with left-sided sciatica about the entire month of May.  He then went to the beach for a few days and things did calm down.  It is gotten back towards bothersome again.  He feels like it is painful to press in the sciatic area and on the side of his hip he gets numbness on the lateral aspect of his hip.  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  He has not really taken any anti-inflammatories.  He  denies any groin pain.  He denies any numbness and tingling in his feet or weakness in his legs.  HPI  Review of Systems He currently denies any headache, chest pain, shortness of breath, fever, chills, nausea, vomiting  Objective: Vital Signs: Ht _0  (1.905 m)   Wt 195 lb (88.5 kg)   BMI 24.37 kg/m   Physical Exam He is alert and orient x3 and in no acute distress Ortho Exam Examination of his legs shows excellent strength in his bilateral lower extremities with normal muscle tone in all muscle groups.  There is no numbness and tingling in either leg.  He has pain to sciatic stretch on the left side.  His pain to palpation of his issue and sciatic area but also the greater trochanteric area on the left side.  His range of motion of his hip is normal.  He has full flexion and extension of the lumbar spine. Specialty Comments:  No specialty comments available.  Imaging: Xr Lumbar Spine 2-3 Views  Result Date: 04/09/2019 2 views of the lumbar spine shows no acute findings.  There is a slight lumbar scoliosis.  There is slight to space  narrowing at L5-S1.    PMFS History: Patient Active Problem List   Diagnosis Date Noted  . Sciatica, left side 04/09/2019  . Lymphadenopathy of head and neck 12/20/2018  . Strain of right trapezius muscle 12/20/2018  . Cervicalgia 12/20/2018  . Onychomycosis 12/05/2018  . History of asthma 08/16/2018  . Hyperlipidemia 08/16/2018  . Thyroid nodule 11/25/2017  . Globus sensation 11/25/2017  . Other somatoform disorders   . Chronic LLQ pain 09/19/2015  . Annual physical exam 06/07/2015  . GERD (gastroesophageal reflux disease) 11/07/2014  . Chest wall pain 08/28/2014  . Generalized anxiety disorder 08/28/2014   Past Medical History:  Diagnosis Date  . Anxiety   . Colon polyp    Benign, colonoscopy 2015  . GERD (gastroesophageal reflux disease)   . Globus sensation   . History of chicken pox   . Lymph node enlargement   . Wears contact  lenses     Family History  Problem Relation Age of Onset  . Thyroid disease Mother        Living  . Goiter Mother   . Cirrhosis Father 23       Deceased  . Alcohol abuse Father        liver cirrhosis   . Cancer Paternal Grandfather        Lung Cancer  . Cancer Maternal Grandfather   . Lymphoma Sister        Neck  . Melanoma Sister   . Skin cancer Sister     Past Surgical History:  Procedure Laterality Date  . COLONOSCOPY WITH ESOPHAGOGASTRODUODENOSCOPY (EGD)    . ESOPHAGOGASTRODUODENOSCOPY (EGD) WITH PROPOFOL N/A 10/21/2017   Procedure: ESOPHAGOGASTRODUODENOSCOPY (EGD) WITH PROPOFOL;  Surgeon: Lucilla Lame, MD;  Location: [facility];  Service: Endoscopy;  Laterality: N/A;  . FINGER SURGERY     fracture  . FLEXIBLE BRONCHOSCOPY N/A 05/29/2018   Procedure: FLEXIBLE BRONCHOSCOPY;  Surgeon: Laverle Hobby, MD;  Location: [facility];  Service: Pulmonary;  Laterality: N/A;  . TONSILLECTOMY    . WISDOM TOOTH EXTRACTION     Social History   Occupational History  . Occupation: Recruitment consultant  Tobacco Use  . Smoking status: Never Smoker  . Smokeless tobacco: Never Used  Substance and Sexual Activity  . Alcohol use: Yes    Alcohol/week: 0.0 standard drinks    Comment: rare  . Drug use: No  . Sexual activity: Yes    Partners: Female

## 2019-04-16 ENCOUNTER — Encounter: Payer: Self-pay | Admitting: Orthopaedic Surgery

## 2019-04-23 IMAGING — US US THYROID
1 series · 13 of 25 positions shown · non-contrast
Comparison: None.

CLINICAL DATA: Incidental on CT.

EXAM:
THYROID ULTRASOUND
TECHNIQUE: Ultrasound examination of the thyroid gland and adjacent soft
tissues was performed.

[Series 1: us thyroid · 0.07mm/px · 13 of 48 slices shown]
[im 1/48]
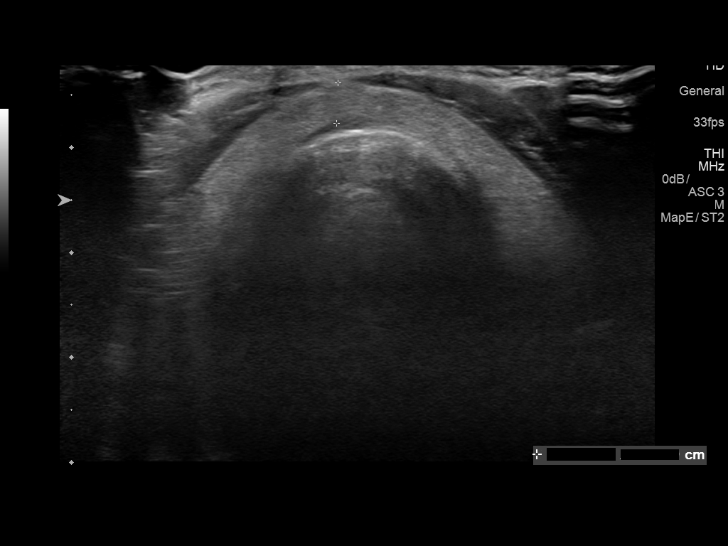
[im 4/48]
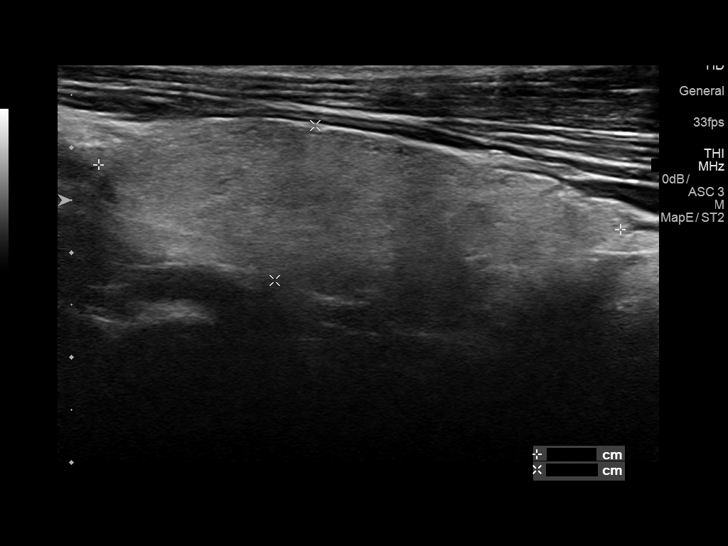
[im 8/48]
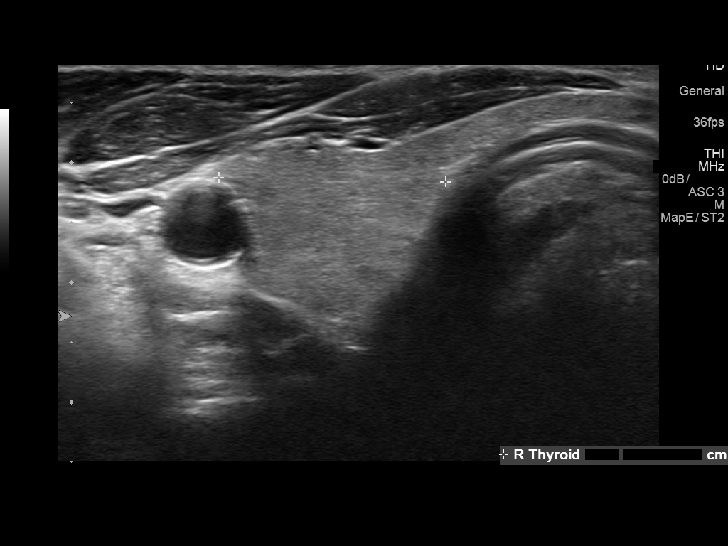
[im 12/48]
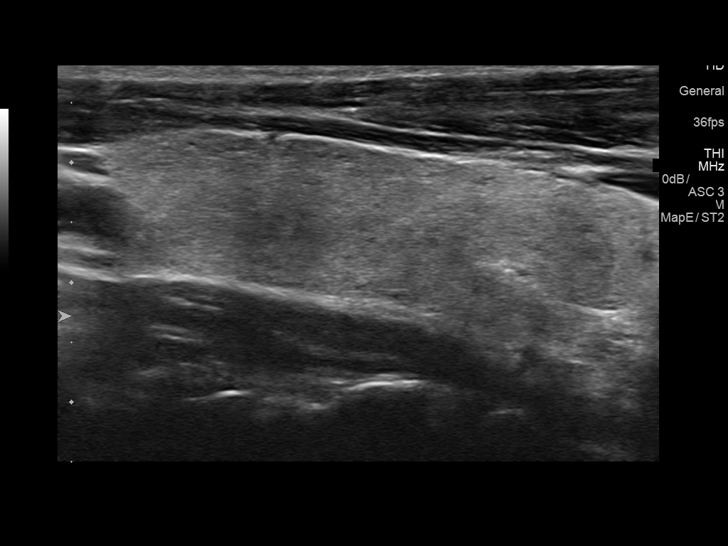
[im 16/48]
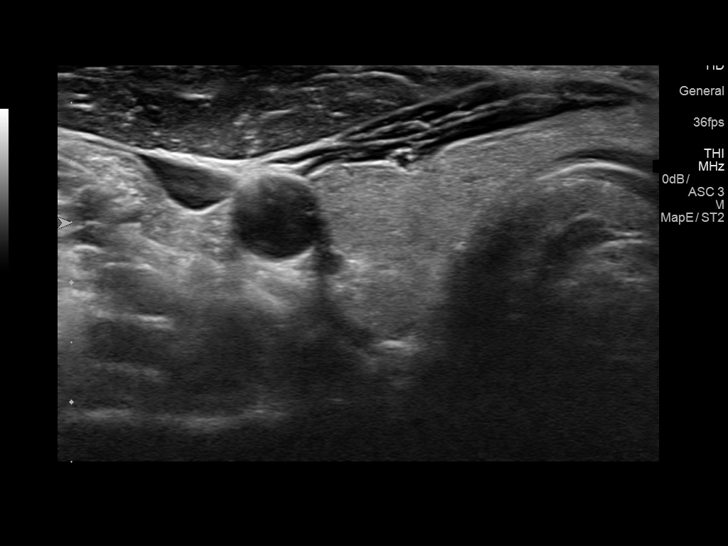
[im 20/48]
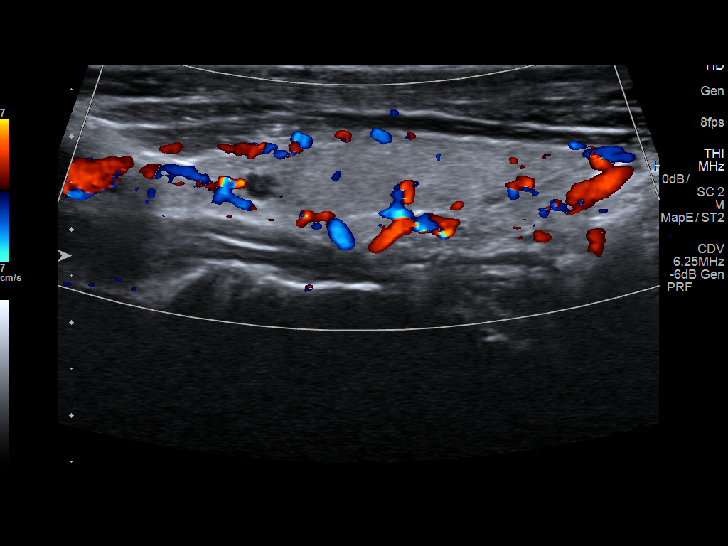
[im 24/48]
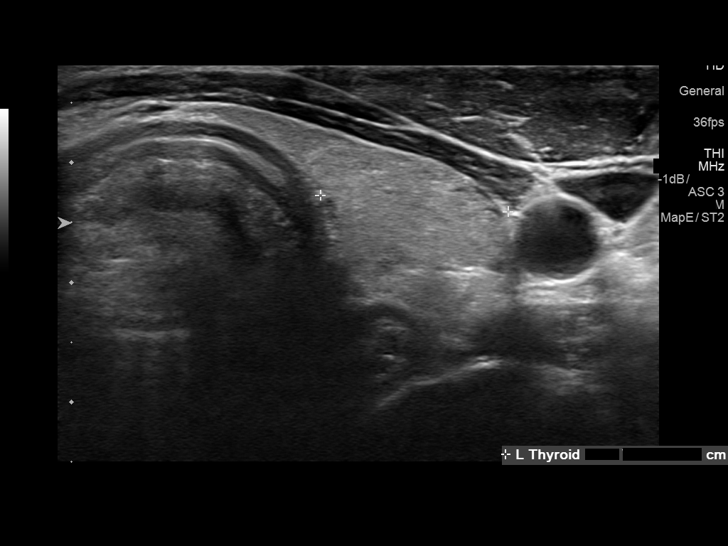
[im 28/48]
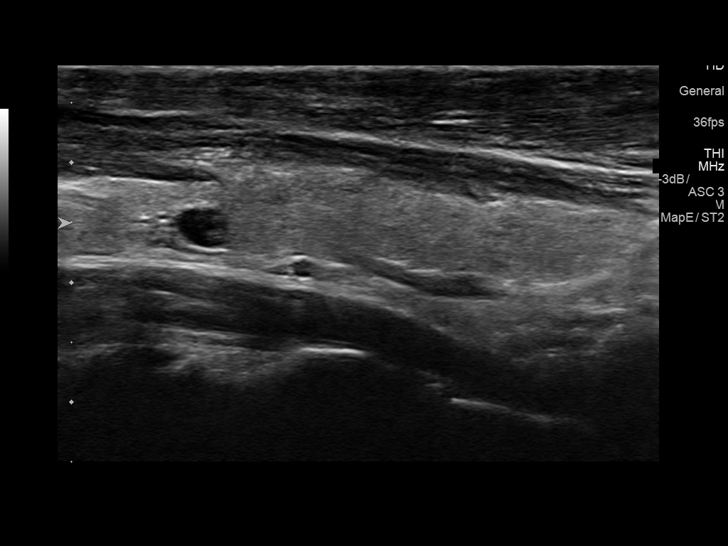
[im 32/48]
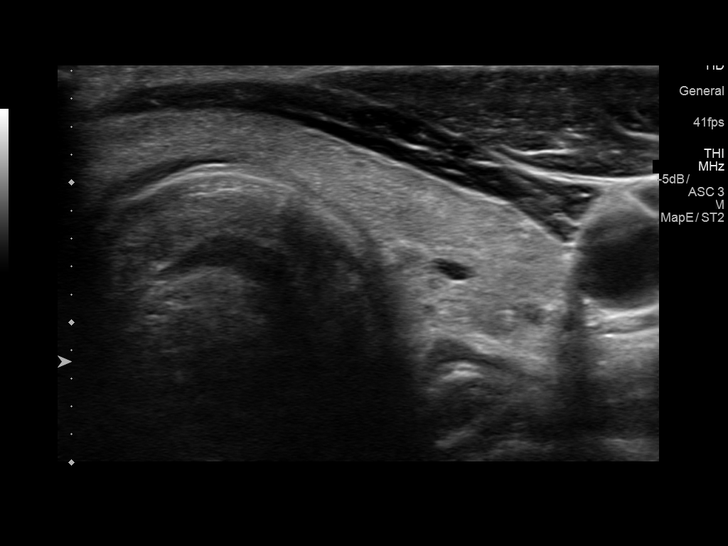
[im 36/48]
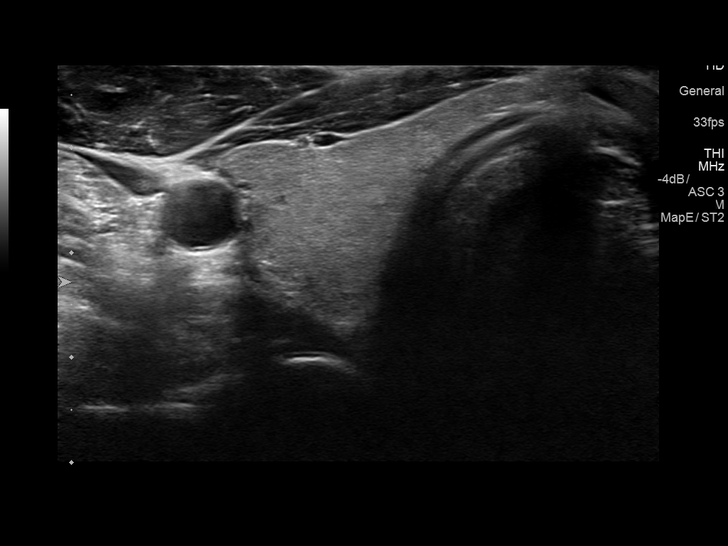
[im 40/48]
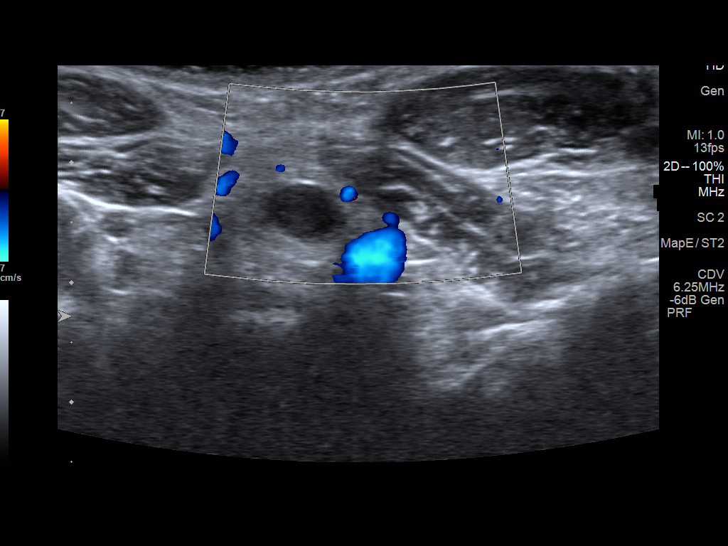
[im 44/48]
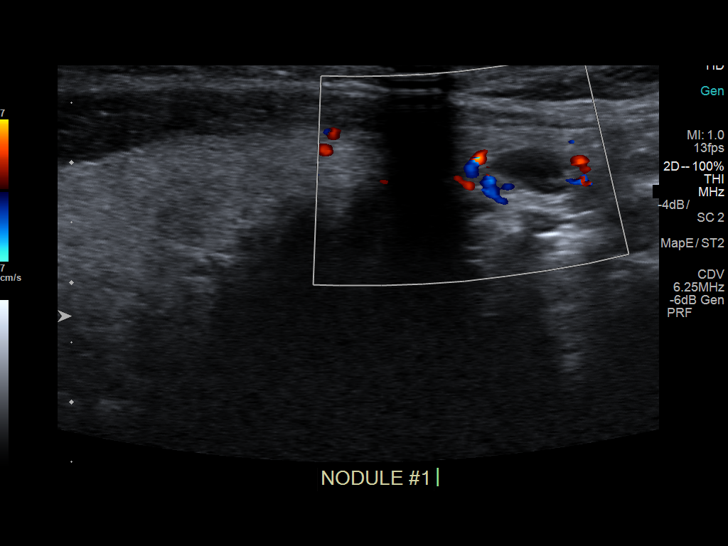
[im 48/48]
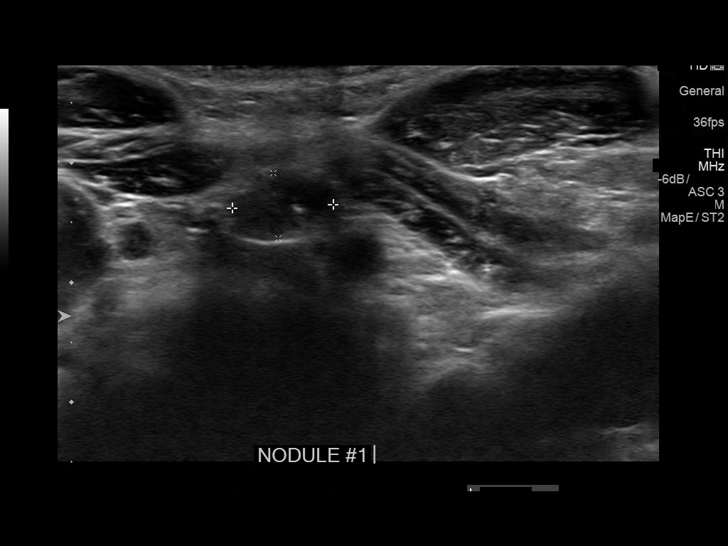

[13 of 25 positions shown; findings below may reference images not displayed]

FINDINGS: Parenchymal Echotexture: Mildly heterogenous

Isthmus: 0.4 cm

Right lobe: 5.0 x 1.5 x 1.9 cm

Left lobe: 5.1 x 1.4 x 1.6 cm

_________________________________________________________

Estimated total number of nodules >/= 1 cm: 0

Number of spongiform nodules >/=  2 cm not described below (TR1): 0

Number of mixed cystic and solid nodules >/= 1.5 cm not described
below (TR2): 0

_________________________________________________________

Nodule # 1:

Location: Isthmus; Mid

Maximum size: 0.8 cm; Other 2 dimensions: 0.7 x 0.6 cm

Composition: mixed cystic and solid (1)

Echogenicity: hypoechoic (2)

Shape: not taller-than-wide (0)

Margins: smooth (0)

Echogenic foci: none (0)

ACR TI-RADS total points: 3.

ACR TI-RADS risk category: TR3 (3 points).

ACR TI-RADS recommendations:

Given size (<1.4 cm) and appearance, this nodule does NOT meet
TI-RADS criteria for biopsy or dedicated follow-up.

_________________________________________________________

Small nodules within the left lobe do not meet criteria for biopsy
nor follow-up.
IMPRESSION: Small nodule in the isthmus, also noted on the CT, does not meet
criteria for biopsy nor follow-up. Other left lobe nodules are small
and do not meet criteria for biopsy nor follow-up.

The above is in keeping with the ACR TI-RADS recommendations - [HOSPITAL] 0983;[DATE].

## 2019-05-07 ENCOUNTER — Ambulatory Visit: Payer: PRIVATE HEALTH INSURANCE | Admitting: Orthopaedic Surgery

## 2019-05-16 ENCOUNTER — Encounter: Payer: Self-pay | Admitting: Internal Medicine

## 2019-06-26 ENCOUNTER — Encounter: Payer: Self-pay | Admitting: Internal Medicine

## 2019-06-27 ENCOUNTER — Ambulatory Visit: Payer: Self-pay

## 2019-06-27 NOTE — Telephone Encounter (Signed)
Patient has been scheduled with NP for tomorrow was advised symptoms worsen or develops SOB to go to UC or ER.

## 2019-06-27 NOTE — Telephone Encounter (Signed)
Lauren,  I wanted you to be aware of this young gentlemen on your schedule and ensure we are comfortable with triage, waiting until tomorrow  [place].

## 2019-06-27 NOTE — Telephone Encounter (Signed)
Patient called stating that for 2 months, mid July, he has had chest pain and soreness to his right chest. He states that he has no SOB, sweats,nausea. 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 He has discussed this pain with Dr McLean-Scocuzza at past appointments. He has recently done a tete visit and the physician prescribed Toradol. He has not taken it yet. He has tried tylenol and ice but has not been consistent. He has concerns that he could have breast CA. He feels some lumps but they are the same both sides. He has no discharge for the nipple. Care advice read to patient. He verbalized understanding of all information. Call transferred to office for scheduling.  Reason for Disposition . [1] Chest pain(s) lasting a few seconds AND [2] persists > 3 days  Answer Assessment - Initial Assessment Questions 1. LOCATION: "Where does it hurt?"       Left chest 2. RADIATION: "Does the pain go anywhere else?" (e.g., into neck, jaw, arms, back)    no 3. ONSET: "When did the chest pain begin?" (Minutes, hours or days)      2 months ago 4. PATTERN "Does the pain come and go, or has it been constant since it started?"  "Does it get worse with exertion?"     comes and goes 5. DURATION: "How long does it last" (e.g., seconds, minutes, hours)     sore 6. SEVERITY: "How bad is the pain?"  (e.g., Scale 1-10; mild, moderate, or severe)    - MILD (1-3): doesn't interfere with normal activities     - MODERATE (4-7): interferes with normal activities or awakens from sleep    - SEVERE (8-10): excruciating pain, unable to do any normal activities      1-3 7. CARDIAC RISK FACTORS: "Do you have any history of heart problems or risk factors for heart disease?" (e.g., angina, prior heart attack; diabetes, high blood pressure, high cholesterol, smoker, or strong family history of heart disease)     no 8. PULMONARY RISK FACTORS: "Do you have any history of lung disease?"  (e.g., blood clots in  lung, asthma, emphysema, birth control pills)    no 9. CAUSE: "What do you think is causing the chest pain?"    Unsure possible injury 10. OTHER SYMPTOMS: "Do you have any other symptoms?" (e.g., dizziness, nausea, vomiting, sweating, fever, difficulty breathing, cough)       no 11. PREGNANCY: "Is there any chance you are pregnant?" "When was your last menstrual period?"       N/A  Protocols used: CHEST PAIN-A-AH

## 2019-06-27 NOTE — Telephone Encounter (Signed)
OK with waiting for appt tomorrow. He is aware to go to ER if symptoms worsen  LG

## 2019-06-28 ENCOUNTER — Ambulatory Visit: Payer: PRIVATE HEALTH INSURANCE | Admitting: Family Medicine

## 2019-06-28 ENCOUNTER — Other Ambulatory Visit: Payer: Self-pay

## 2019-06-28 ENCOUNTER — Encounter: Payer: Self-pay | Admitting: Family Medicine

## 2019-06-28 VITALS — BP 132/84 | HR 91 | Temp 97.3°F | Resp 18 | Ht 75.0 in | Wt 198.0 lb

## 2019-06-28 DIAGNOSIS — M62838 Other muscle spasm: Secondary | ICD-10-CM | POA: Diagnosis not present

## 2019-06-28 DIAGNOSIS — R079 Chest pain, unspecified: Secondary | ICD-10-CM

## 2019-06-28 DIAGNOSIS — M940 Chondrocostal junction syndrome [Tietze]: Secondary | ICD-10-CM

## 2019-06-28 MED ORDER — CYCLOBENZAPRINE HCL 10 MG PO TABS: 10.0000 mg | ORAL_TABLET | Freq: Every evening | ORAL | 5 refills | Status: DC | PRN

## 2019-06-28 NOTE — Patient Instructions (Addendum)
Your EKG is normal. Your breast exam is normal.    Costochondritis Costochondritis is swelling and irritation (inflammation) of the tissue (cartilage) that connects your ribs to your breastbone (sternum). This causes pain in the front of your chest. Usually, the pain:  Starts gradually.  Is in more than one rib. This condition usually goes away on its own over time. Follow these instructions at home:  Do not do anything that makes your pain worse.  If directed, put ice on the painful area: ? Put ice in a plastic bag. ? Place a towel between your skin and the bag. ? Leave the ice on for 20 minutes, 2-3 times a day.  If directed, put heat on the affected area as often as told by your doctor. Use the heat source that your doctor tells you to use, such as a moist heat pack or a heating pad. ? Place a towel between your skin and the heat source. ? Leave the heat on for 20-30 minutes. ? Take off the heat if your skin turns bright red. This is very important if you cannot feel pain, heat, or cold. You may have a greater risk of getting burned.  Take over-the-counter and prescription medicines only as told by your doctor.  Return to your normal activities as told by your doctor. Ask your doctor what activities are safe for you.  Keep all follow-up visits as told by your doctor. This is important. Contact a doctor if:  You have chills or a fever.  Your pain does not go away or it gets worse.  You have a cough that does not go away. Get help right away if:  You are short of breath. This information is not intended to replace advice given to you by your health care provider. Make sure you discuss any questions you have with your health care provider. Document Released: 03/22/2008 Document Revised: 10/19/2017 Document Reviewed: 01/28/2016 Elsevier Patient Education  2020 [employer].

## 2019-06-28 NOTE — Progress Notes (Signed)
Subjective:    Patient ID: Adam Bishop, male    DOB: 1986/10/07, 33 y.o.   MRN: [id number]  HPI   Patient presents to clinic complaining of left-sided chest pain off and on for months.  Patient states he recently got a kayak and has been having to lift a kayak up above his head to put on top of car and also lift a kayak off the car and into the water, kayak is very heavy.  Action of kayaking also put strain on arms and chest wall.  Also states he has concerns over possible breast issues, states he has been palpating breast tissue and does not feel any specific lumps or bumps.  Denies any nipple inversion, nipple drainage.  Denies any family history of breast cancer as far as he knows.  Denies palpitations, shortness of breath or wheezing.  Denies lower extremity swelling.  Denies fever or chills.  Denies nausea, vomiting or diarrhea.  Patient Active Problem List   Diagnosis Date Noted  . Sciatica, left side 04/09/2019  . Lymphadenopathy of head and neck 12/20/2018  . Strain of right trapezius muscle 12/20/2018  . Cervicalgia 12/20/2018  . Onychomycosis 12/05/2018  . History of asthma 08/16/2018  . Hyperlipidemia 08/16/2018  . Thyroid nodule 11/25/2017  . Globus sensation 11/25/2017  . Other somatoform disorders   . Chronic LLQ pain 09/19/2015  . Annual physical exam 06/07/2015  . GERD (gastroesophageal reflux disease) 11/07/2014  . Chest wall pain 08/28/2014  . Generalized anxiety disorder 08/28/2014   Social History   Tobacco Use  . Smoking status: Never Smoker  . Smokeless tobacco: Never Used  Substance Use Topics  . Alcohol use: Yes    Alcohol/week: 0.0 standard drinks    Comment: rare   Family History  Problem Relation Age of Onset  . Thyroid disease Mother        Living  . Goiter Mother   . Cirrhosis Father 29       Deceased  . Alcohol abuse Father        liver cirrhosis   . Cancer Paternal Grandfather        Lung Cancer  . Cancer Maternal Grandfather   .  Lymphoma Sister        Neck  . Melanoma Sister   . Skin cancer Sister     Review of Systems   Constitutional: Negative for chills, fatigue and fever.  HENT: Negative for congestion, ear pain, sinus pain and sore throat.   Eyes: Negative.   Respiratory: Negative for cough, shortness of breath and wheezing.   Cardiovascular: Negative for palpitations and leg swelling. +CP Gastrointestinal: Negative for abdominal pain, diarrhea, nausea and vomiting.  Genitourinary: Negative for dysuria, frequency and urgency.  Musculoskeletal: Negative for arthralgias and myalgias.  Skin: Negative for color change, pallor and rash.  Neurological: Negative for syncope, light-headedness and headaches.  Psychiatric/Behavioral: The patient is not nervous/anxious.       Objective:   Physical Exam Vitals signs and nursing note reviewed.  Constitutional:      General: He is not in acute distress.    Appearance: He is not ill-appearing, toxic-appearing or diaphoretic.  HENT:     Head: Normocephalic and atraumatic.  Eyes:     General: No scleral icterus.    Extraocular Movements: Extraocular movements intact.     Conjunctiva/sclera: Conjunctivae normal.     Pupils: Pupils are equal, round, and reactive to light.  Neck:     Musculoskeletal: Normal range  of motion and neck supple. No neck rigidity.     Vascular: No carotid bruit.  Cardiovascular:     Rate and Rhythm: Normal rate and regular rhythm.     Heart sounds: Normal heart sounds. No murmur. No friction rub. No gallop.   Pulmonary:     Effort: Pulmonary effort is normal. No respiratory distress.     Breath sounds: Normal breath sounds.  Chest:     Chest wall: Tenderness present.     Breasts:        Right: Normal.        Left: Normal.       Comments: Tender area with palpation represented by red mark on diagram. Pain reproducible. No breast lump or mass felt.  Musculoskeletal:     Right lower leg: No edema.     Left lower leg: No edema.   Lymphadenopathy:     Cervical: No cervical adenopathy.     Upper Body:     Right upper body: No supraclavicular, axillary or pectoral adenopathy.     Left upper body: No supraclavicular, axillary or pectoral adenopathy.  Skin:    General: Skin is warm.     Coloration: Skin is not jaundiced or pale.  Neurological:     General: No focal deficit present.     Mental Status: He is alert and oriented to person, place, and time.     Gait: Gait normal.  Psychiatric:        Mood and Affect: Mood normal.        Behavior: Behavior normal.        Thought Content: Thought content normal.    Today's Vitals   06/28/19 1400  BP: 132/84  Pulse: 91  Resp: 18  Temp: (!) 97.3 F (36.3 C)  TempSrc: Temporal  SpO2: 98%  Weight: 198 lb (89.8 kg)  Height: 6\' 3"  (1.905 m)   Body mass index is 24.75 kg/m.    Assessment & Plan:    A total of 25  minutes were spent face-to-face with the patient during this encounter and over half of that time was spent on counseling and coordination of care. The patient was counseled on EKG results, causes of CP    Chest pain/muscle spasm/costochondritis- EKG performed in clinic and reviewed by me.  EKG shows normal sinus rhythm.  Patient's physical exam is unremarkable and he appears in no distress.  Long discussion with patient reassuring that his EKG and physical exam are both normal.  Advised patient that it is extremely possible he pulled and irritated muscles and chest wall using his kayak.  Also offered to order mammogram for patient due to concerns over possible breast cancer, he declines due to not feeling any lumps or bumps in the breast.  States he will let us know if he changes his mind.  Patient will use ibuprofen and muscle relaxer as needed for any pain.  Also discussed topical rubs like BenGay or Biofreeze as well as heating pad to help reduce pain and chest wall muscles.  Patient will keep all regular follow-ups with PCP as planned return to clinic  sooner if any issues arise.

## 2019-07-02 ENCOUNTER — Encounter: Payer: Self-pay | Admitting: Family Medicine

## 2019-07-03 ENCOUNTER — Encounter: Payer: Self-pay | Admitting: Family Medicine

## 2019-07-03 ENCOUNTER — Telehealth: Payer: Self-pay | Admitting: Lab

## 2019-07-03 DIAGNOSIS — F419 Anxiety disorder, unspecified: Secondary | ICD-10-CM

## 2019-07-03 MED ORDER — ESCITALOPRAM OXALATE 10 MG PO TABS: 10.0000 mg | ORAL_TABLET | Freq: Every day | ORAL | 1 refills | Status: DC

## 2019-07-03 NOTE — Telephone Encounter (Signed)
Scheduled pt

## 2019-07-03 NOTE — Telephone Encounter (Signed)
Ok great I'll give it a try.  What medication would you suggest for me to try and would you be able to send that in? Just lmk thanks  [employer]

## 2019-07-31 IMAGING — US SOFT TISSUE ULTRASOUND HEAD/NECK
1 series · 14 of 16 positions shown · non-contrast
Comparison: Neck CT 11/03/2017

CLINICAL DATA: Lymphadenopathy of head and neck. Right neck pain
for 2 months

EXAM:
ULTRASOUND OF HEAD/NECK SOFT TISSUES
TECHNIQUE: Ultrasound examination of the head and neck soft tissues was
performed in the area of clinical concern.

[Series 1: soft tissue ultrasound head/neck · 14 of 16 slices shown]
[im 1/16]
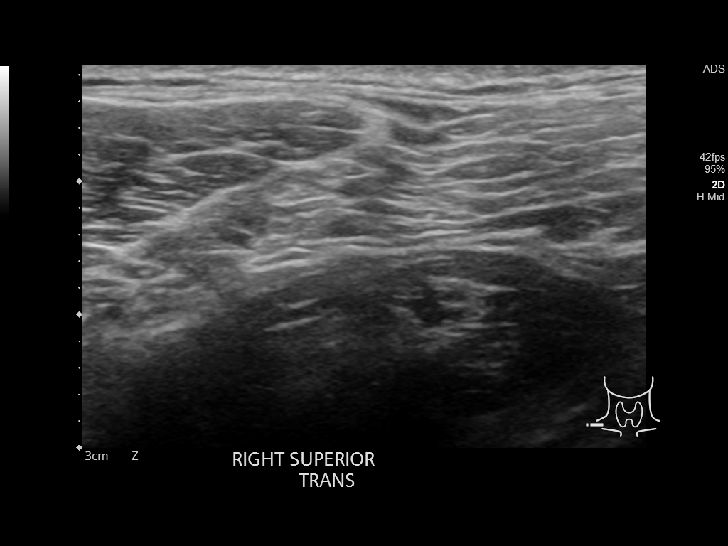
[im 2/16]
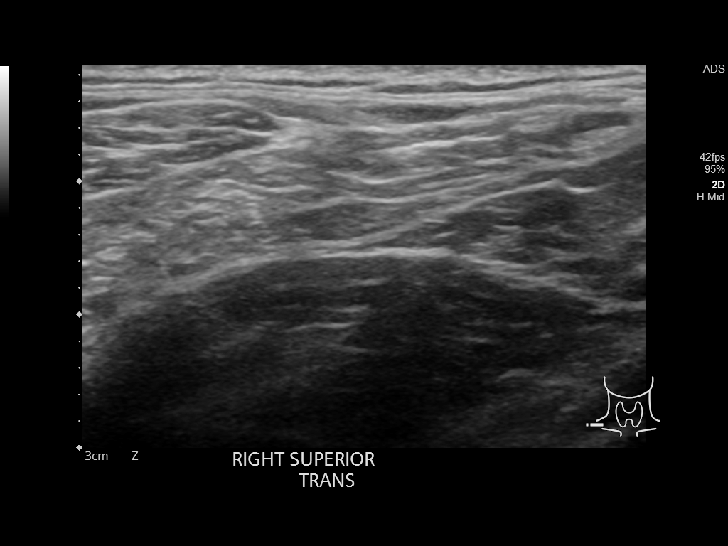
[im 3/16]
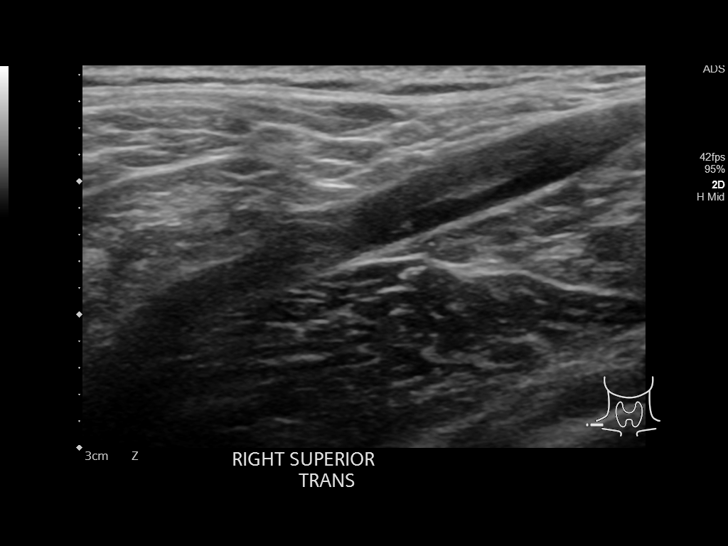
[im 5/16]
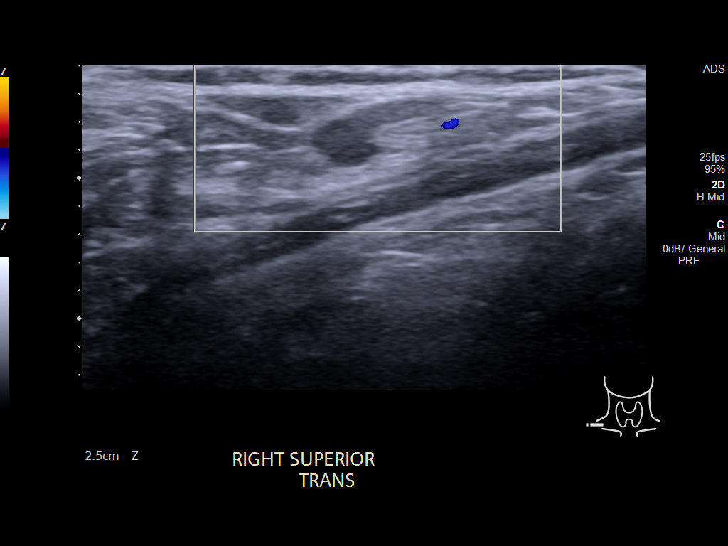
[im 6/16]
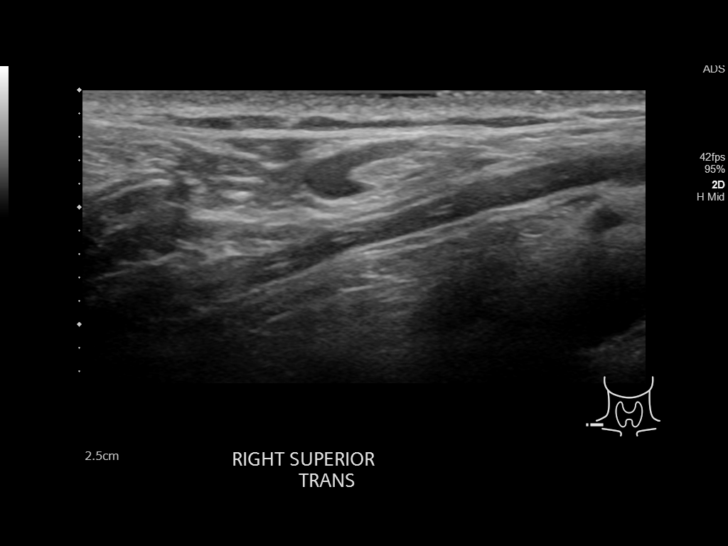
[im 7/16]
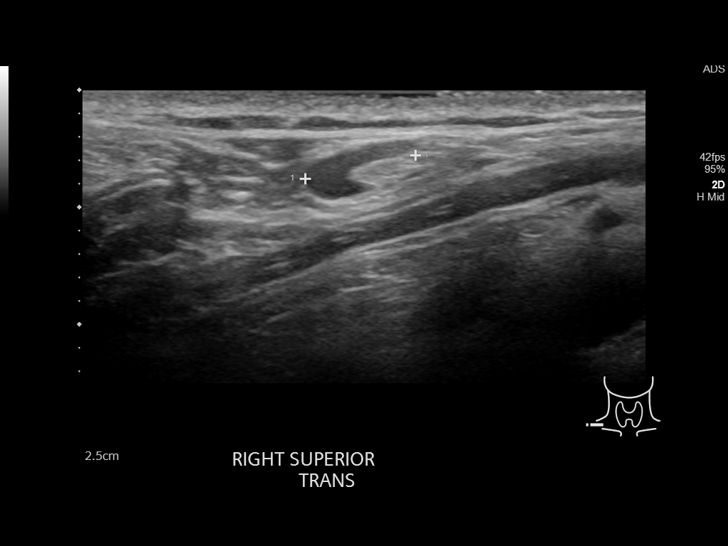
[im 8/16]
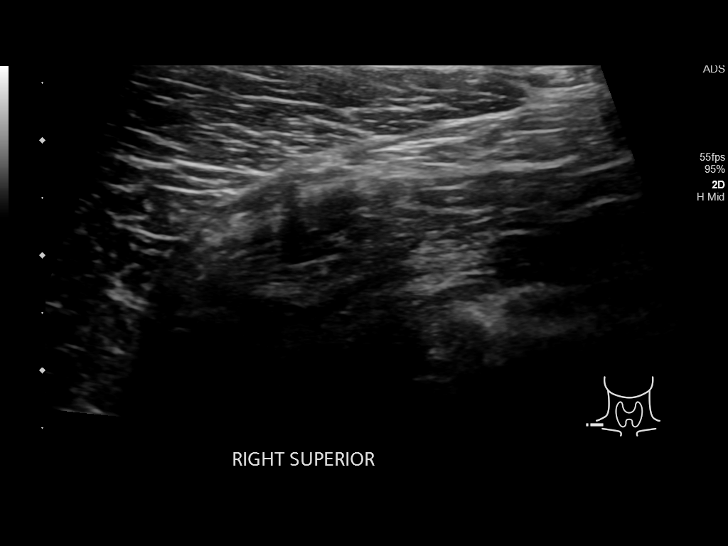
[im 9/16]
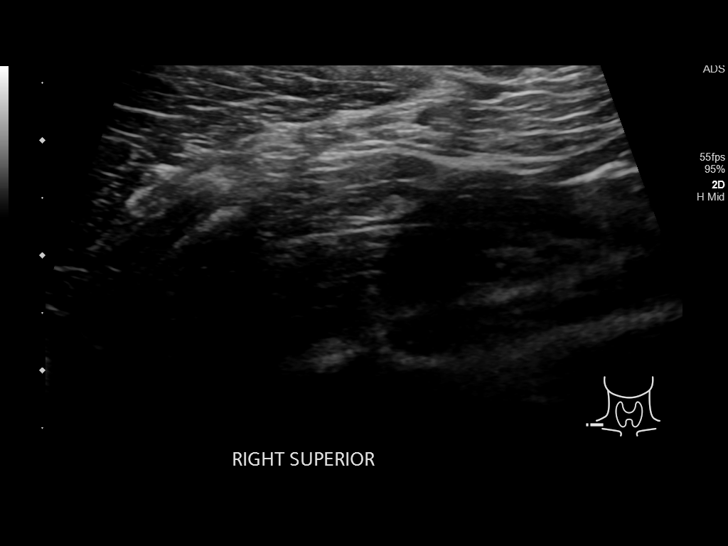
[im 10/16]
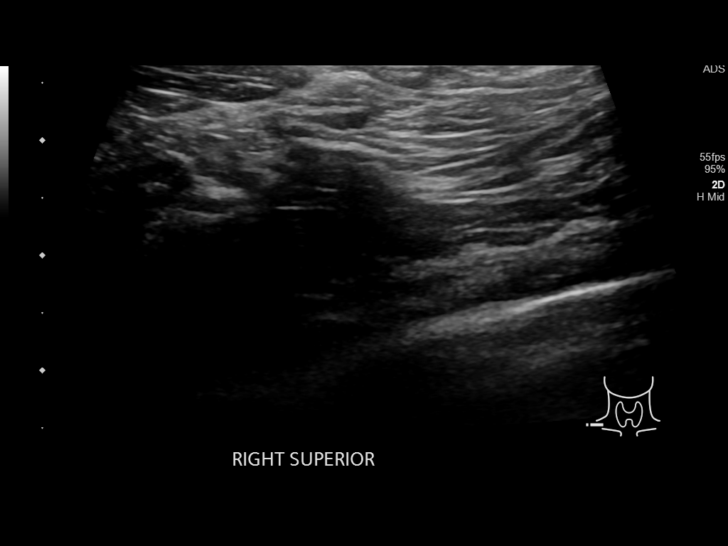
[im 11/16]
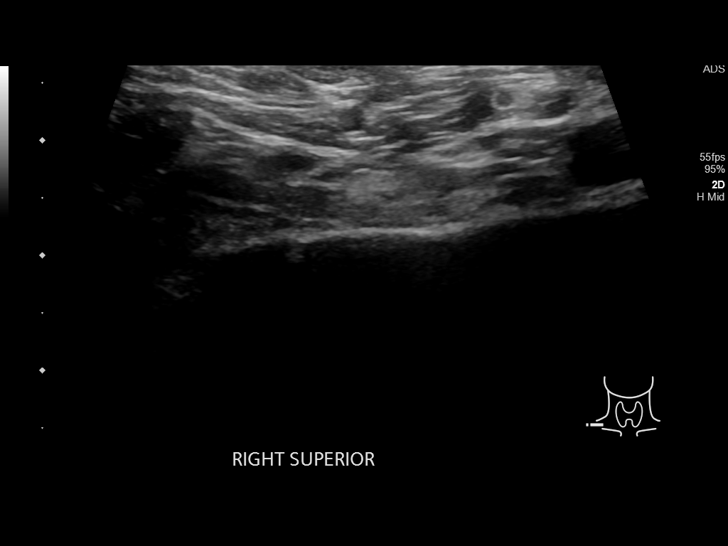
[im 13/16]
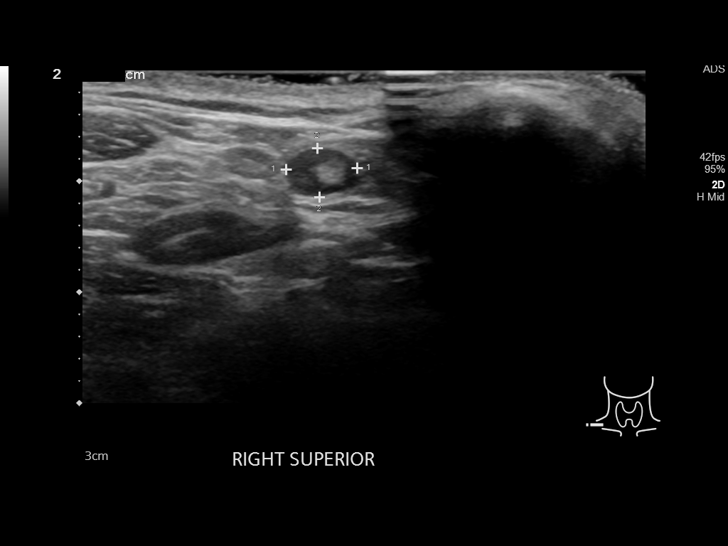
[im 14/16]
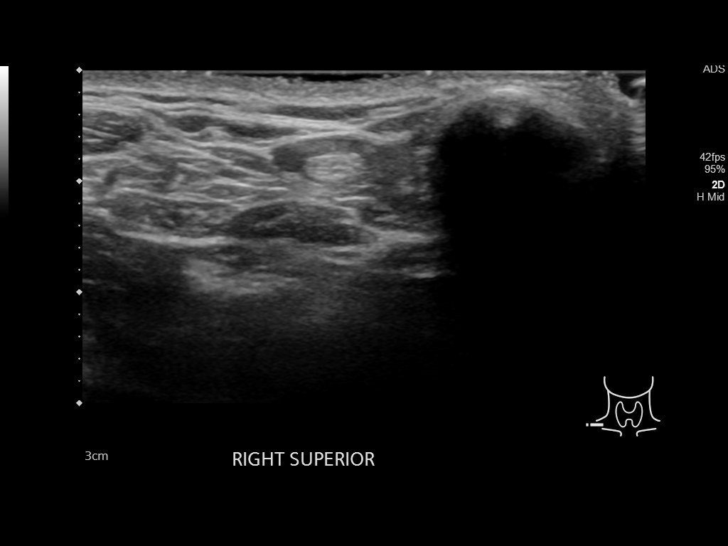
[im 15/16]
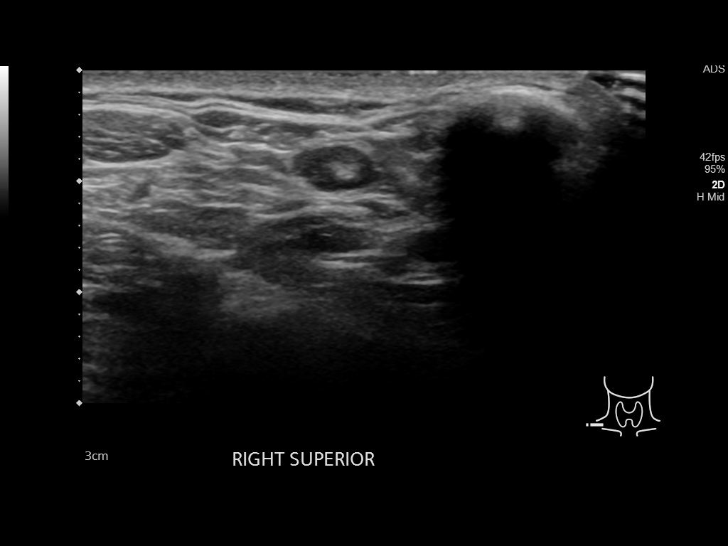
[im 16/16]
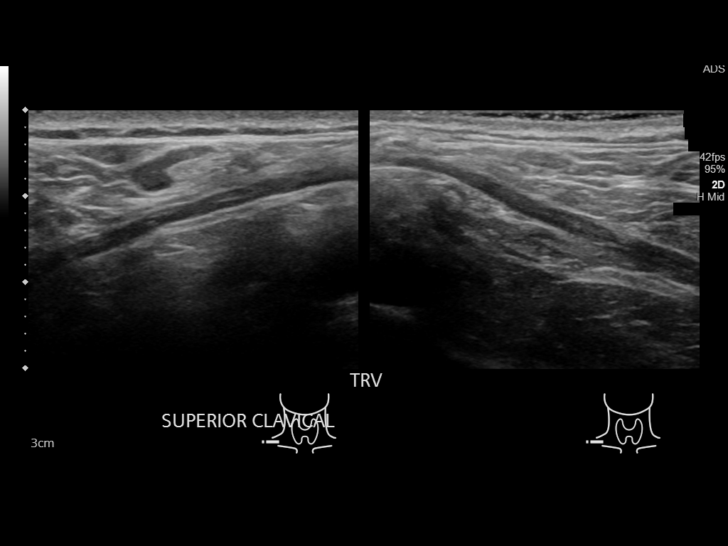

[14 of 16 positions shown; findings below may reference images not displayed]

FINDINGS: In the area of concern there is a normal architecture lymph node
with smooth cortex and preserved fatty hilum which measures 10 x 4
mm. No evident mass, collection or adenopathy in the imaged neck.
IMPRESSION: Benign ultrasound of the symptomatic right neck.

## 2019-08-07 ENCOUNTER — Encounter: Payer: Self-pay | Admitting: Family Medicine

## 2019-08-07 ENCOUNTER — Ambulatory Visit (INDEPENDENT_AMBULATORY_CARE_PROVIDER_SITE_OTHER): Payer: PRIVATE HEALTH INSURANCE | Admitting: Family Medicine

## 2019-08-07 ENCOUNTER — Other Ambulatory Visit: Payer: Self-pay

## 2019-08-07 VITALS — BP 122/68 | HR 78 | Temp 97.3°F | Ht 75.0 in | Wt 198.0 lb

## 2019-08-07 DIAGNOSIS — J309 Allergic rhinitis, unspecified: Secondary | ICD-10-CM | POA: Diagnosis not present

## 2019-08-07 DIAGNOSIS — F419 Anxiety disorder, unspecified: Secondary | ICD-10-CM

## 2019-08-07 DIAGNOSIS — H6983 Other specified disorders of Eustachian tube, bilateral: Secondary | ICD-10-CM | POA: Diagnosis not present

## 2019-08-07 MED ORDER — ESCITALOPRAM OXALATE 10 MG PO TABS: 10.0000 mg | ORAL_TABLET | Freq: Every day | ORAL | 1 refills | Status: DC

## 2019-08-07 MED ORDER — CETIRIZINE HCL 10 MG PO TABS: 10.0000 mg | ORAL_TABLET | Freq: Every day | ORAL | 1 refills | Status: DC

## 2019-08-07 MED ORDER — FLUTICASONE PROPIONATE 50 MCG/ACT NA SUSP: 2.0000 | Freq: Every day | NASAL | 6 refills | Status: DC

## 2019-08-07 NOTE — Progress Notes (Signed)
Subjective:    Patient ID: Adam Bishop, male    DOB: 07-Nov-1985, 33 y.o.   MRN: [id number]  HPI   Patient presents to clinic for follow-up on anxiety since starting Lexapro.  States Lexapro does seem to be helping, does not feel nervous or on edge all the time.  Denies SI or HI.  Patient also complains of seasonal allergies and bilateral ear fullness.  States he is talked with PCP about this before, and was told that it most likely is some fluid buildup.  He does not take any sort of allergy medicine or Flonase nasal spray regularly.   Patient Active Problem List   Diagnosis Date Noted  . Sciatica, left side 04/09/2019  . Lymphadenopathy of head and neck 12/20/2018  . Strain of right trapezius muscle 12/20/2018  . Cervicalgia 12/20/2018  . Onychomycosis 12/05/2018  . History of asthma 08/16/2018  . Hyperlipidemia 08/16/2018  . Thyroid nodule 11/25/2017  . Globus sensation 11/25/2017  . Other somatoform disorders   . Chronic LLQ pain 09/19/2015  . Annual physical exam 06/07/2015  . GERD (gastroesophageal reflux disease) 11/07/2014  . Chest wall pain 08/28/2014  . Generalized anxiety disorder 08/28/2014   Social History   Tobacco Use  . Smoking status: Never Smoker  . Smokeless tobacco: Never Used  Substance Use Topics  . Alcohol use: Yes    Alcohol/week: 0.0 standard drinks    Comment: rare    Review of Systems  Constitutional: Negative for chills, fatigue and fever.  HENT: Negative for congestion, sinus pain and sore throat. +bilat ear fullness, right seems worse today  Eyes: Negative.   Respiratory: Negative for cough, shortness of breath and wheezing.   Cardiovascular: Negative for chest pain, palpitations and leg swelling.  Gastrointestinal: Negative for abdominal pain, diarrhea, nausea and vomiting.  Genitourinary: Negative for dysuria, frequency and urgency.  Musculoskeletal: Negative for arthralgias and myalgias.  Skin: Negative for color change, pallor  and rash.  Neurological: Negative for syncope, light-headedness and headaches.  Psychiatric/Behavioral: The patient is not nervous/anxious.       Objective:   Physical Exam Vitals signs and nursing note reviewed.  Constitutional:      General: He is not in acute distress.    Appearance: He is not ill-appearing, toxic-appearing or diaphoretic.  HENT:     Head: Normocephalic.     Right Ear: Ear canal and external ear normal.     Left Ear: Ear canal and external ear normal.     Ears:     Comments: Mild fullness bilat TMs Eyes:     General: No scleral icterus.    Extraocular Movements: Extraocular movements intact.     Conjunctiva/sclera: Conjunctivae normal.  Neck:     Musculoskeletal: Normal range of motion and neck supple. No neck rigidity.  Cardiovascular:     Rate and Rhythm: Normal rate and regular rhythm.     Heart sounds: Normal heart sounds.  Pulmonary:     Effort: Pulmonary effort is normal. No respiratory distress.     Breath sounds: Normal breath sounds.  Neurological:     Mental Status: He is alert.  Psychiatric:        Mood and Affect: Mood normal.        Behavior: Behavior normal.    Today's Vitals   08/07/19 0817  BP: 122/68  Pulse: 78  Temp: (!) 97.3 F (36.3 C)  TempSrc: Skin  SpO2: 98%  Weight: 198 lb (89.8 kg)  Height: 6\' 3"  (  1.905 m)   Body mass index is 24.75 kg/m.     Assessment & Plan:    Anxiety-patient mood is improved with Lexapro.  He will continue.  Seasonal allergies/eustachian tube dysfunction-advised patient that treatment of allergies is an ongoing process and we have to be consistent with taking allergy medication like Claritin or Zyrtec and use Flonase every day.  Patient given prescriptions of Zyrtec and Flonase and will use these every day.  Also suggested doing saline nasal rinses every day to rinse out sinuses and reduce exposure to allergens as much as possible  Patient will keep regular follow-ups with PCP as planned  return to clinic sooner if any issues arise

## 2019-08-07 NOTE — Patient Instructions (Signed)

## 2019-09-05 DIAGNOSIS — H9201 Otalgia, right ear: Secondary | ICD-10-CM | POA: Insufficient documentation

## 2019-09-05 DIAGNOSIS — M26629 Arthralgia of temporomandibular joint, unspecified side: Secondary | ICD-10-CM | POA: Insufficient documentation

## 2019-10-31 ENCOUNTER — Encounter: Payer: Self-pay | Admitting: Internal Medicine

## 2019-11-02 ENCOUNTER — Telehealth: Payer: Self-pay | Admitting: Internal Medicine

## 2019-11-02 NOTE — Telephone Encounter (Signed)
I called pt twice and left a vm to call ofc. °

## 2019-11-07 ENCOUNTER — Ambulatory Visit (INDEPENDENT_AMBULATORY_CARE_PROVIDER_SITE_OTHER): Payer: PRIVATE HEALTH INSURANCE | Admitting: Internal Medicine

## 2019-11-07 ENCOUNTER — Encounter: Payer: Self-pay | Admitting: Internal Medicine

## 2019-11-07 ENCOUNTER — Other Ambulatory Visit: Payer: Self-pay

## 2019-11-07 VITALS — BP 133/97 | Ht 75.0 in | Wt 205.0 lb

## 2019-11-07 DIAGNOSIS — Z1329 Encounter for screening for other suspected endocrine disorder: Secondary | ICD-10-CM

## 2019-11-07 DIAGNOSIS — Z1389 Encounter for screening for other disorder: Secondary | ICD-10-CM | POA: Diagnosis not present

## 2019-11-07 DIAGNOSIS — Z0000 Encounter for general adult medical examination without abnormal findings: Secondary | ICD-10-CM

## 2019-11-07 DIAGNOSIS — M62838 Other muscle spasm: Secondary | ICD-10-CM

## 2019-11-07 DIAGNOSIS — F419 Anxiety disorder, unspecified: Secondary | ICD-10-CM

## 2019-11-07 DIAGNOSIS — M26609 Unspecified temporomandibular joint disorder, unspecified side: Secondary | ICD-10-CM | POA: Diagnosis not present

## 2019-11-07 DIAGNOSIS — Z13220 Encounter for screening for lipoid disorders: Secondary | ICD-10-CM

## 2019-11-07 MED ORDER — CYCLOBENZAPRINE HCL 10 MG PO TABS: 10.0000 mg | ORAL_TABLET | Freq: Every evening | ORAL | 5 refills | Status: DC | PRN

## 2019-11-07 NOTE — Progress Notes (Signed)
Virtual Visit via Video Note  I connected with Adam Bishop  on 11/07/19 at  8:45 AM EST by a video enabled telemedicine application and verified that I am speaking with the correct person using two identifiers.  Location patient: work Environmental manager or home office Persons participating in the virtual visit: patient, provider  I discussed the limitations of evaluation and management by telemedicine and the availability of in person appointments. The patient expressed understanding and agreed to proceed.   HPI: 1. F/u right ear/TMJ and inflammation stylomandibular ligament right side and cavity saw Dr. Regino Bellow DDS in [facility] 11/06/19 and note reviewed rec injection, laser, muscle relaxers, splint for pt the cost would be $1500 to 3200 and this a lot. He reports grinding teeth at night and has a guard currently but needs a device to cover more surface area. He states sx's in 11/2018, 06/2019, 07/2019 after dental appts and resolved over the summer so flexeril 10 mg qhs may have helped  He will call his local dentist about the cavity  He also has seen ENT Dr. Redmond Baseman hearing test right ear normal   2. Elevated BP 134/98 11/06/19 at above appt he reports he was nervous advised pt to monitor BP at home   ROS: See pertinent positives and negatives per HPI. Denies CP, sob, wt gain 10 lbs (using peleton qhs 20-30 minutes and free wts), diet at times not the best  Anxiety/depression improved stopped lexapro due to made him feel like zombie  Past Medical History:  Diagnosis Date  . Anxiety   . Colon polyp    Benign, colonoscopy 2015  . GERD (gastroesophageal reflux disease)   . Globus sensation   . History of chicken pox   . Lymph node enlargement   . Wears contact lenses     Past Surgical History:  Procedure Laterality Date  . COLONOSCOPY WITH ESOPHAGOGASTRODUODENOSCOPY (EGD)    . ESOPHAGOGASTRODUODENOSCOPY (EGD) WITH PROPOFOL N/A 10/21/2017   Procedure: ESOPHAGOGASTRODUODENOSCOPY  (EGD) WITH PROPOFOL;  Surgeon: Lucilla Lame, MD;  Location: [place];  Service: Endoscopy;  Laterality: N/A;  . FINGER SURGERY     fracture  . FLEXIBLE BRONCHOSCOPY N/A 05/29/2018   Procedure: FLEXIBLE BRONCHOSCOPY;  Surgeon: Laverle Hobby, MD;  Location: [facility];  Service: Pulmonary;  Laterality: N/A;  . TONSILLECTOMY    . WISDOM TOOTH EXTRACTION      Family History  Problem Relation Age of Onset  . Thyroid disease Mother        Living  . Goiter Mother   . Cirrhosis Father 18       Deceased  . Alcohol abuse Father        liver cirrhosis   . Cancer Paternal Grandfather        Lung Cancer  . Cancer Maternal Grandfather   . Lymphoma Sister        Neck  . Melanoma Sister   . Skin cancer Sister     SOCIAL HX:  Married wife is Paramedic 1 son 2 y.o as of 08/16/18  Works Mudlogger   Current Outpatient Medications:  .  cetirizine (ZYRTEC) 10 MG tablet, Take 1 tablet (10 mg total) by mouth daily., Disp: 90 tablet, Rfl: 1 .  cyclobenzaprine (FLEXERIL) 10 MG tablet, Take 1 tablet (10 mg total) by mouth at bedtime as needed for muscle spasms., Disp: 30 tablet, Rfl: 5 .  fluticasone (FLONASE) 50 MCG/ACT nasal spray, Place 2 sprays into both nostrils daily., Disp: 16  g, Rfl: 6  EXAM:  VITALS per patient if applicable:  GENERAL: alert, oriented, appears well and in no acute distress  HEENT: atraumatic, conjunttiva clear, no obvious abnormalities on inspection of external nose and ears  NECK: normal movements of the head and neck  LUNGS: on inspection no signs of respiratory distress, breathing rate appears normal, no obvious gross SOB, gasping or wheezing  CV: no obvious cyanosis  MS: moves all visible extremities without noticeable abnormality  PSYCH/NEURO: pleasant and cooperative, no obvious depression or anxiety, speech and thought processing grossly intact  ASSESSMENT AND PLAN:  Discussed the following assessment and  plan:  TMJ (temporomandibular joint syndrome) Prn flexeril 10 mg qhs helps  Disc with dental and dental specialist further tx I.e splints/guards/PT/injections/laser tx but too expensive  Reviewed noted as above 11/06/19   Anxiety ok off lexapro  Does not want to try meds for now   HM Fasting labs 12/14/19  rec mvt daily mens otc   Flu shot had 10/24/2018 not 2021, agreeable to get with fasting labs upcoming 2021  Tdap had 07/18/12  Never had HPV shot Hep B immune MMR rec given Rx prev   Saw derm 2-3 years agofrom 2018/2019mom h/o BCC shoulder  Had EGD x 2 and colonoscopies x 2 in the past Dr. Allen Norris  Sees Dr. Redmond Baseman ENT rec vitamin D3 otc 2000 IU daily  TMJ specialist Dr. Regino Bellow [facility]  -we discussed possible serious and likely etiologies, options for evaluation and workup, limitations of telemedicine visit vs in person visit, treatment, treatment risks and precautions. Pt prefers to treat via telemedicine empirically rather then risking or undertaking an in person visit at this moment. Patient agrees to seek prompt in person care if worsening, new symptoms arise, or if is not improving with treatment.   I discussed the assessment and treatment plan with the patient. The patient was provided an opportunity to ask questions and all were answered. The patient agreed with the plan and demonstrated an understanding of the instructions.   The patient was advised to call back or seek an in-person evaluation if the symptoms worsen or if the condition fails to improve as anticipated.  Time spent 20-29 min Delorise Jackson, MD

## 2019-12-18 ENCOUNTER — Ambulatory Visit: Payer: PRIVATE HEALTH INSURANCE

## 2019-12-18 ENCOUNTER — Other Ambulatory Visit: Payer: PRIVATE HEALTH INSURANCE

## 2020-01-15 ENCOUNTER — Other Ambulatory Visit (INDEPENDENT_AMBULATORY_CARE_PROVIDER_SITE_OTHER): Payer: PRIVATE HEALTH INSURANCE

## 2020-01-15 ENCOUNTER — Other Ambulatory Visit: Payer: Self-pay

## 2020-01-15 DIAGNOSIS — Z1389 Encounter for screening for other disorder: Secondary | ICD-10-CM

## 2020-01-15 DIAGNOSIS — Z1329 Encounter for screening for other suspected endocrine disorder: Secondary | ICD-10-CM | POA: Diagnosis not present

## 2020-01-15 DIAGNOSIS — Z13220 Encounter for screening for lipoid disorders: Secondary | ICD-10-CM

## 2020-01-15 DIAGNOSIS — Z0000 Encounter for general adult medical examination without abnormal findings: Secondary | ICD-10-CM

## 2020-01-15 LAB — CBC WITH DIFFERENTIAL/PLATELET
Basophils Absolute: 0 10*3/uL (ref 0.0–0.1)
Basophils Relative: 0.3 % (ref 0.0–3.0)
Eosinophils Absolute: 0.1 10*3/uL (ref 0.0–0.7)
Eosinophils Relative: 1.8 % (ref 0.0–5.0)
HCT: 45.3 % (ref 39.0–52.0)
Hemoglobin: 15.7 g/dL (ref 13.0–17.0)
Lymphocytes Relative: 37.1 % (ref 12.0–46.0)
Lymphs Abs: 2.2 10*3/uL (ref 0.7–4.0)
MCHC: 34.7 g/dL (ref 30.0–36.0)
MCV: 90.5 fl (ref 78.0–100.0)
Monocytes Absolute: 0.5 10*3/uL (ref 0.1–1.0)
Monocytes Relative: 8.4 % (ref 3.0–12.0)
Neutro Abs: 3.1 10*3/uL (ref 1.4–7.7)
Neutrophils Relative %: 52.4 % (ref 43.0–77.0)
Platelets: 198 10*3/uL (ref 150.0–400.0)
RBC: 5 Mil/uL (ref 4.22–5.81)
RDW: 12.7 % (ref 11.5–15.5)
WBC: 5.9 10*3/uL (ref 4.0–10.5)

## 2020-01-15 LAB — LIPID PANEL
Cholesterol: 189 mg/dL (ref 0–200)
HDL: 33.1 mg/dL — ABNORMAL LOW (ref >39.00)
LDL Cholesterol: 141 mg/dL — ABNORMAL HIGH (ref 0–99)
NonHDL: 156.06
Total CHOL/HDL Ratio: 6
Triglycerides: 77 mg/dL (ref 0.0–149.0)
VLDL: 15.4 mg/dL (ref 0.0–40.0)

## 2020-01-15 LAB — COMPREHENSIVE METABOLIC PANEL
ALT: 26 U/L (ref 0–53)
AST: 19 U/L (ref 0–37)
Albumin: 4.4 g/dL (ref 3.5–5.2)
Alkaline Phosphatase: 67 U/L (ref 39–117)
BUN: 14 mg/dL (ref 6–23)
CO2: 31 mEq/L (ref 19–32)
Calcium: 9.6 mg/dL (ref 8.4–10.5)
Chloride: 102 mEq/L (ref 96–112)
Creatinine, Ser: 0.78 mg/dL (ref 0.40–1.50)
GFR: 113.46 mL/min (ref >60.00)
Glucose, Bld: 92 mg/dL (ref 70–99)
Potassium: 4.7 mEq/L (ref 3.5–5.1)
Sodium: 138 mEq/L (ref 135–145)
Total Bilirubin: 0.7 mg/dL (ref 0.2–1.2)
Total Protein: 7.2 g/dL (ref 6.0–8.3)

## 2020-01-15 LAB — TSH: TSH: 1.44 u[IU]/mL (ref 0.35–4.50)

## 2020-01-16 LAB — URINALYSIS, ROUTINE W REFLEX MICROSCOPIC
Bilirubin Urine: NEGATIVE
Glucose, UA: NEGATIVE
Hgb urine dipstick: NEGATIVE
Ketones, ur: NEGATIVE
Leukocytes,Ua: NEGATIVE
Nitrite: NEGATIVE
Protein, ur: NEGATIVE
Specific Gravity, Urine: 1.024 (ref 1.001–1.03)
pH: 5.5 (ref 5.0–8.0)

## 2020-02-05 ENCOUNTER — Encounter: Payer: Self-pay | Admitting: Internal Medicine

## 2020-02-18 ENCOUNTER — Ambulatory Visit: Payer: PRIVATE HEALTH INSURANCE | Admitting: Podiatry

## 2020-02-18 ENCOUNTER — Encounter: Payer: Self-pay | Admitting: Podiatry

## 2020-02-18 ENCOUNTER — Other Ambulatory Visit: Payer: Self-pay

## 2020-02-18 DIAGNOSIS — B351 Tinea unguium: Secondary | ICD-10-CM

## 2020-02-18 DIAGNOSIS — M79675 Pain in left toe(s): Secondary | ICD-10-CM | POA: Diagnosis not present

## 2020-02-18 MED ORDER — TERBINAFINE HCL 250 MG PO TABS: 250.0000 mg | ORAL_TABLET | Freq: Every day | ORAL | 2 refills | Status: DC

## 2020-02-18 NOTE — Progress Notes (Signed)
This patient presents the office to discuss treatment of his fungus toenails both feet.  He says that the nails are not causing pain but he would like to have normal appearing toenails both feet.  At a previous visit we have ordered lab work which revealed he does have fungal toenails.  He also had blood work performed in March revealing no evidence of any liver function problem.  He states he is also having pain in his big toe joint of his right foot when he works in his shoes and walking on concrete.  He presents the office today for an evaluation of his feet.    General Appearance  Alert, conversant and in no acute stress.  Vascular  Dorsalis pedis and posterior tibial  pulses are palpable  bilaterally.  Capillary return is within normal limits  bilaterally. Temperature is within normal limits  bilaterally.  Neurologic  Senn-Weinstein monofilament wire test within normal limits  bilaterally. Muscle power within normal limits bilaterally.  Nails Thick disfigured discolored nails with subungual debris  from second  to fifth toes bilaterally. No evidence of bacterial infection or drainage bilaterally.     Orthopedic  No limitations of motion  feet .  No crepitus or effusions noted.  No bony pathology or digital deformities noted..  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    Skin  normotropic skin with no porokeratosis noted bilaterally.  No signs of infections or ulcers noted.    Onychomycosis  B/L  HAV  B/L.  ROV.  Discussed the fungus toenails with this patient.  Patient was prescribed Lamisil since he already had positive fungal growth and his blood work was within normal limits.  Prescribe Lamisil 250 mg #30  One  Daily.Two refills.  Discussed the painful big toe joint of his right foot with this patient.  Told him to make an appointment with Dr. Allena Katz for Dr. Allena Katz to evaluate and treat his painful bunion.  This patient was told to return to the office in  6 months and we will then evaluate the fungus treatment.  Helane Gunther DPM

## 2020-03-25 ENCOUNTER — Encounter: Payer: Self-pay | Admitting: Internal Medicine

## 2020-05-06 ENCOUNTER — Encounter: Payer: Self-pay | Admitting: Internal Medicine

## 2020-05-06 ENCOUNTER — Ambulatory Visit: Payer: PRIVATE HEALTH INSURANCE | Admitting: Internal Medicine

## 2020-05-09 ENCOUNTER — Other Ambulatory Visit: Payer: Self-pay

## 2020-05-09 ENCOUNTER — Ambulatory Visit: Payer: PRIVATE HEALTH INSURANCE | Attending: Internal Medicine

## 2020-05-09 DIAGNOSIS — Z23 Encounter for immunization: Secondary | ICD-10-CM

## 2020-05-09 NOTE — Progress Notes (Signed)
   Covid-19 Vaccination Clinic  Name:  Adam Bishop    MRN: [id number] DOB: November 13, 1985  05/09/2020  [name] was observed post Covid-19 immunization for 15 minutes without incident. He was provided with Vaccine Information Sheet and instruction to access the V-Safe system.   [name] was instructed to call 911 with any severe reactions post vaccine: Marland Kitchen Difficulty breathing  . Swelling of face and throat  . A fast heartbeat  . A bad rash all over body  . Dizziness and weakness   Immunizations Administered    Name Date Dose VIS Date Route   Pfizer COVID-19 Vaccine 05/09/2020 10:15 AM 0.3 mL 12/12/2018 Intramuscular   Manufacturer: [employer], [employer]   Lot: [id number]   NDC: [address]-9

## 2020-05-11 ENCOUNTER — Telehealth: Payer: PRIVATE HEALTH INSURANCE | Admitting: Family

## 2020-05-11 DIAGNOSIS — J029 Acute pharyngitis, unspecified: Secondary | ICD-10-CM | POA: Diagnosis not present

## 2020-05-11 NOTE — Progress Notes (Signed)
We are sorry that you are not feeling well.  Here is how we plan to help!  Your symptoms indicate a likely viral infection (Pharyngitis).   Pharyngitis is inflammation in the back of the throat which can cause a sore throat, scratchiness and sometimes difficulty swallowing.   Pharyngitis is typically caused by a respiratory virus and will just run its course.  Please keep in mind that your symptoms could last up to 10 days.  For throat pain, we recommend over the counter oral pain relief medications such as acetaminophen or aspirin, or anti-inflammatory medications such as ibuprofen or naproxen sodium.  Topical treatments such as oral throat lozenges or sprays may be used as needed.  Avoid close contact with loved ones, especially the very young and elderly.  Remember to wash your hands thoroughly throughout the day as this is the number one way to prevent the spread of infection and wipe down door knobs and counters with disinfectant.  You can be tested for COVID to rule out.   After careful review of your answers, I would not recommend and antibiotic for your condition.  Antibiotics should not be used to treat conditions that we suspect are caused by viruses like the virus that causes the common cold or flu. However, some people can have Strep with atypical symptoms. You may need formal testing in clinic or office to confirm if your symptoms continue or worsen.  Providers prescribe antibiotics to treat infections caused by bacteria. Antibiotics are very powerful in treating bacterial infections when they are used properly.  To maintain their effectiveness, they should be used only when necessary.  Overuse of antibiotics has resulted in the development of super bugs that are resistant to treatment!    Home Care:  Only take medications as instructed by your medical team.  Do not drink alcohol while taking these medications.  A steam or ultrasonic humidifier can help congestion.  You can place a towel  over your head and breathe in the steam from hot water coming from a faucet.  Avoid close contacts especially the very young and the elderly.  Cover your mouth when you cough or sneeze.  Always remember to wash your hands.  Get Help Right Away If:  You develop worsening fever or throat pain.  You develop a severe head ache or visual changes.  Your symptoms persist after you have completed your treatment plan.  Make sure you  Understand these instructions.  Will watch your condition.  Will get help right away if you are not doing well or get worse.  Your e-visit answers were reviewed by a board certified advanced clinical practitioner to complete your personal care plan.  Depending on the condition, your plan could have included both over the counter or prescription medications.  If there is a problem please reply  once you have received a response from your provider.  Your safety is important to [place].  If you have drug allergies check your prescription carefully.    You can use MyChart to ask questions about todays visit, request a non-urgent call back, or ask for a work or school excuse for 24 hours related to this e-Visit. If it has been greater than 24 hours you will need to follow up with your provider, or enter a new e-Visit to address those concerns.  You will get an e-mail in the next two days asking about your experience.  I hope that your e-visit has been valuable and will speed your recovery.  Thank you for using e-visits.   Approximately 5 minutes was spent documenting and reviewing patient's chart.

## 2020-05-16 ENCOUNTER — Telehealth (INDEPENDENT_AMBULATORY_CARE_PROVIDER_SITE_OTHER): Payer: PRIVATE HEALTH INSURANCE | Admitting: Internal Medicine

## 2020-05-16 ENCOUNTER — Encounter: Payer: Self-pay | Admitting: Internal Medicine

## 2020-05-16 VITALS — Ht 75.0 in | Wt 200.0 lb

## 2020-05-16 DIAGNOSIS — F419 Anxiety disorder, unspecified: Secondary | ICD-10-CM | POA: Diagnosis not present

## 2020-05-16 DIAGNOSIS — J40 Bronchitis, not specified as acute or chronic: Secondary | ICD-10-CM

## 2020-05-16 DIAGNOSIS — J069 Acute upper respiratory infection, unspecified: Secondary | ICD-10-CM

## 2020-05-16 DIAGNOSIS — F4521 Hypochondriasis: Secondary | ICD-10-CM | POA: Diagnosis not present

## 2020-05-16 MED ORDER — FLUOXETINE HCL 10 MG PO CAPS: 10.0000 mg | ORAL_CAPSULE | Freq: Every day | ORAL | 3 refills | Status: DC

## 2020-05-16 MED ORDER — AZITHROMYCIN 250 MG PO TABS: ORAL_TABLET | ORAL | 0 refills | Status: DC

## 2020-05-16 NOTE — Progress Notes (Signed)
Patient presenting with congestion, cough, sore throat, and green phlegm. Last week had a coworker sick. Coworker was diagnosed with acute bronchitis.   No fever, was tested negative for COVID on Tuesday. It was rapid test that was negative. As well as PCR test negative.

## 2020-05-16 NOTE — Progress Notes (Signed)
Virtual Visit via Video Note  I connected with Adam Bishop  on 05/16/20 at 12:00 PM EDT by a video enabled telemedicine application and verified that I am speaking with the correct person using two identifiers.  Location patient: car Location provider:work or home office Persons participating in the virtual visit: patient, provider  I discussed the limitations of evaluation and management by telemedicine and the availability of in person appointments. The patient expressed understanding and agreed to proceed.   HPI: 1. URI: Tues/Weds last week coworker was sick with bronchitisURI sx's and he had covid vaccine 05/09/20 and went to outdoor concert Sat. He since developed cough with pheglm thick yellow green no fever tried mucinex dm. Chest is sore from coughing his sx's started Sunday/Monday/Tuesday tested neg x 2 covid weds rapid and PCR test  2. Hypochondria/anxiety rec f/u with therapy and also tried lexapro, paxil zoloft in the past but was c/w wt gain and felt like zombie wants to resume medication as anxiety is worse and will reach out to his therapist   ROS: See pertinent positives and negatives per HPI.  Past Medical History:  Diagnosis Date  . Anxiety   . Colon polyp    Benign, colonoscopy 2015  . GERD (gastroesophageal reflux disease)   . Globus sensation   . History of chicken pox   . Lymph node enlargement   . Wears contact lenses     Past Surgical History:  Procedure Laterality Date  . COLONOSCOPY WITH ESOPHAGOGASTRODUODENOSCOPY (EGD)    . ESOPHAGOGASTRODUODENOSCOPY (EGD) WITH PROPOFOL N/A 10/21/2017   Procedure: ESOPHAGOGASTRODUODENOSCOPY (EGD) WITH PROPOFOL;  Surgeon: Lucilla Lame, MD;  Location: [facility];  Service: Endoscopy;  Laterality: N/A;  . FINGER SURGERY     fracture  . FLEXIBLE BRONCHOSCOPY N/A 05/29/2018   Procedure: FLEXIBLE BRONCHOSCOPY;  Surgeon: Laverle Hobby, MD;  Location: [facility];  Service: Pulmonary;  Laterality: N/A;  .  TONSILLECTOMY    . WISDOM TOOTH EXTRACTION      Family History  Problem Relation Age of Onset  . Thyroid disease Mother        Living  . Goiter Mother   . Cirrhosis Father 71       Deceased  . Alcohol abuse Father        liver cirrhosis   . Cancer Paternal Grandfather        Lung Cancer  . Cancer Maternal Grandfather   . Lymphoma Sister        Neck  . Melanoma Sister   . Skin cancer Sister     SOCIAL HX: lives with wife and son   Current Outpatient Medications:  .  azithromycin (ZITHROMAX) 250 MG tablet, 2 pills day 1 and 1 pill day 2-5, Disp: 6 tablet, Rfl: 0 .  cyclobenzaprine (FLEXERIL) 10 MG tablet, Take 1 tablet (10 mg total) by mouth at bedtime as needed for muscle spasms. (Patient not taking: Reported on 05/16/2020), Disp: 30 tablet, Rfl: 5 .  FLUoxetine (PROZAC) 10 MG capsule, Take 1 capsule (10 mg total) by mouth daily., Disp: 90 capsule, Rfl: 3 .  fluticasone (FLONASE) 50 MCG/ACT nasal spray, Place 2 sprays into both nostrils daily. (Patient not taking: Reported on 05/16/2020), Disp: 16 g, Rfl: 6 .  terbinafine (LAMISIL) 250 MG tablet, Take 1 tablet (250 mg total) by mouth daily. (Patient not taking: Reported on 05/16/2020), Disp: 30 tablet, Rfl: 2  EXAM:  VITALS per patient if applicable:  GENERAL: alert, oriented, appears well and in no acute distress  HEENT: atraumatic, conjunttiva clear, no obvious abnormalities on inspection of external nose and ears  NECK: normal movements of the head and neck  LUNGS: on inspection no signs of respiratory distress, breathing rate appears normal, no obvious gross SOB, gasping or wheezing  CV: no obvious cyanosis  MS: moves all visible extremities without noticeable abnormality  PSYCH/NEURO: pleasant and cooperative, no obvious depression or anxiety, speech and thought processing grossly intact  ASSESSMENT AND PLAN:  Discussed the following assessment and plan:  Upper respiratory tract infection, bronchitis with h/o  pna- Plan: azithromycin (ZITHROMAX) 250 MG tablet x 5 days  mucinex dm  rtc or call back if not better  Anxiety - Plan: FLUoxetine (PROZAC) 10 MG capsule Hypochondria - Plan: FLUoxetine (PROZAC) 10 MG capsule rec f/u with therapy      HM Flu shot had 1/7/2020not 2021, 12/2019 labs had Tdap had10/1/13 Pfizer 1/2 Never had HPV shot Hep B immune MMRrec given Rx prev   Colonoscopy due age 69 Saw derm 2-3 years agofrom 2018/2019mom h/o BCC shoulder  Had EGD x 2 and colonoscopies x 2 in the past Dr. Allen Norris  Sees Dr. Redmond Baseman ENT rec vitamin D3 otc 2000 IU daily TMJ specialist Dr. Regino Bellow [facility] [place]  -we discussed possible serious and likely etiologies, options for evaluation and workup, limitations of telemedicine visit vs in person visit, treatment, treatment risks and precautions. Pt prefers to treat via telemedicine empirically rather then risking or undertaking an in person visit at this moment. Patient agrees to seek prompt in person care if worsening, new symptoms arise, or if is not improving with treatment.   I discussed the assessment and treatment plan with the patient. The patient was provided an opportunity to ask questions and all were answered. The patient agreed with the plan and demonstrated an understanding of the instructions.   The patient was advised to call back or seek an in-person evaluation if the symptoms worsen or if the condition fails to improve as anticipated.  Time spent 20 min Delorise Jackson, MD

## 2020-05-22 ENCOUNTER — Ambulatory Visit: Payer: PRIVATE HEALTH INSURANCE | Admitting: Internal Medicine

## 2020-05-28 ENCOUNTER — Telehealth: Payer: Self-pay | Admitting: Internal Medicine

## 2020-05-28 NOTE — Telephone Encounter (Signed)
Lm on vm to call office and schedule aptt around 9/8.

## 2020-06-07 ENCOUNTER — Ambulatory Visit: Payer: PRIVATE HEALTH INSURANCE

## 2020-06-25 ENCOUNTER — Other Ambulatory Visit: Payer: Self-pay | Admitting: Critical Care Medicine

## 2020-06-25 ENCOUNTER — Encounter: Payer: Self-pay | Admitting: Internal Medicine

## 2020-06-25 ENCOUNTER — Other Ambulatory Visit: Payer: PRIVATE HEALTH INSURANCE

## 2020-06-25 DIAGNOSIS — Z20822 Contact with and (suspected) exposure to covid-19: Secondary | ICD-10-CM

## 2020-06-26 ENCOUNTER — Encounter: Payer: Self-pay | Admitting: Internal Medicine

## 2020-06-27 ENCOUNTER — Encounter: Payer: Self-pay | Admitting: Internal Medicine

## 2020-06-27 LAB — SARS-COV-2, NAA 2 DAY TAT

## 2020-06-27 LAB — NOVEL CORONAVIRUS, NAA: SARS-CoV-2, NAA: DETECTED — AB

## 2020-06-30 ENCOUNTER — Telehealth: Payer: Self-pay | Admitting: Internal Medicine

## 2020-06-30 NOTE — Telephone Encounter (Signed)
Called pt 06/27/20 after 5 pm due to +covid  Had sx's 06/19/20 went to [facility] and rapid test negative  06/24/20 had another PCR + test  His son had been sick 2 weeks prior in preschool but tested covid 19 and rapid test negative   HE is having chest congestion but now with less phelgm, cough, no fever. He started out having diarrhea which has improved and he did have lack of smell which is starting to come back and ? Nasal congestion   He does not feel albuterol needed currently or further care in ED as he is getting better  He had 1/2 pfizer shots 05/09/20 and 2nd shot 06/20/20 after developed sx's of covid 19    Given advise via phone and my chart  Vitamins for covid for adults  Vitamin D3 4000 IU daily  Vitamin C 1000 mg daily  Zinc 100 mg daily  Quercetin 250-500 mg 2x per day Mucinex DM green label 60-1200 mg daily  Warm tea with honey and lemon Increase water intake  Nasal saline, nasacort or flonase for nasal congestion   Oxygen monitor and make sure it stays >90% Let me know if anything changes I rec. 14 days out of work 9/22 or 07/10/20  Send me short term disability and FMLA 336 [phone] if needed Dr. French Ana McLean-Scocuzza

## 2020-07-04 ENCOUNTER — Ambulatory Visit: Payer: PRIVATE HEALTH INSURANCE | Admitting: Internal Medicine

## 2020-07-23 ENCOUNTER — Ambulatory Visit: Payer: PRIVATE HEALTH INSURANCE | Admitting: Internal Medicine

## 2020-07-31 ENCOUNTER — Encounter: Payer: Self-pay | Admitting: Internal Medicine

## 2020-07-31 ENCOUNTER — Other Ambulatory Visit: Payer: Self-pay

## 2020-07-31 ENCOUNTER — Ambulatory Visit: Payer: PRIVATE HEALTH INSURANCE | Admitting: Internal Medicine

## 2020-07-31 VITALS — BP 126/70 | HR 81 | Temp 98.1°F | Ht 75.0 in | Wt 199.2 lb

## 2020-07-31 DIAGNOSIS — Z0000 Encounter for general adult medical examination without abnormal findings: Secondary | ICD-10-CM | POA: Diagnosis not present

## 2020-07-31 DIAGNOSIS — K219 Gastro-esophageal reflux disease without esophagitis: Secondary | ICD-10-CM | POA: Diagnosis not present

## 2020-07-31 DIAGNOSIS — Z1329 Encounter for screening for other suspected endocrine disorder: Secondary | ICD-10-CM

## 2020-07-31 DIAGNOSIS — Z23 Encounter for immunization: Secondary | ICD-10-CM | POA: Diagnosis not present

## 2020-07-31 DIAGNOSIS — F411 Generalized anxiety disorder: Secondary | ICD-10-CM

## 2020-07-31 DIAGNOSIS — E785 Hyperlipidemia, unspecified: Secondary | ICD-10-CM

## 2020-07-31 DIAGNOSIS — Z1389 Encounter for screening for other disorder: Secondary | ICD-10-CM

## 2020-07-31 MED ORDER — ESOMEPRAZOLE MAGNESIUM 40 MG PO CPDR: 40.0000 mg | DELAYED_RELEASE_CAPSULE | Freq: Every evening | ORAL | 3 refills | Status: DC

## 2020-07-31 NOTE — Patient Instructions (Addendum)
Pepcid reflux   Gastroesophageal Reflux Disease, Adult Gastroesophageal reflux (GER) happens when acid from the stomach flows up into the tube that connects the mouth and the stomach (esophagus). Normally, food travels down the esophagus and stays in the stomach to be digested. However, when a person has GER, food and stomach acid sometimes move back up into the esophagus. If this becomes a more serious problem, the person may be diagnosed with a disease called gastroesophageal reflux disease (GERD). GERD occurs when the reflux:  Happens often.  Causes frequent or severe symptoms.  Causes problems such as damage to the esophagus. When stomach acid comes in contact with the esophagus, the acid may cause soreness (inflammation) in the esophagus. Over time, GERD may create small holes (ulcers) in the lining of the esophagus. What are the causes? This condition is caused by a problem with the muscle between the esophagus and the stomach (lower esophageal sphincter, or LES). Normally, the LES muscle closes after food passes through the esophagus to the stomach. When the LES is weakened or abnormal, it does not close properly, and that allows food and stomach acid to go back up into the esophagus. The LES can be weakened by certain dietary substances, medicines, and medical conditions, including:  Tobacco use.  Pregnancy.  Having a hiatal hernia.  Alcohol use.  Certain foods and beverages, such as coffee, chocolate, onions, and peppermint. What increases the risk? You are more likely to develop this condition if you:  Have an increased body weight.  Have a connective tissue disorder.  Use NSAID medicines. What are the signs or symptoms? Symptoms of this condition include:  Heartburn.  Difficult or painful swallowing.  The feeling of having a lump in the throat.  Abitter taste in the mouth.  Bad breath.  Having a large amount of saliva.  Having an upset or bloated  stomach.  Belching.  Chest pain. Different conditions can cause chest pain. Make sure you see your health care provider if you experience chest pain.  Shortness of breath or wheezing.  Ongoing (chronic) cough or a night-time cough.  Wearing away of tooth enamel.  Weight loss. How is this diagnosed? Your health care provider will take a medical history and perform a physical exam. To determine if you have mild or severe GERD, your health care provider may also monitor how you respond to treatment. You may also have tests, including:  A test to examine your stomach and esophagus with a small camera (endoscopy).  A test thatmeasures the acidity level in your esophagus.  A test thatmeasures how much pressure is on your esophagus.  A barium swallow or modified barium swallow test to show the shape, size, and functioning of your esophagus. How is this treated? The goal of treatment is to help relieve your symptoms and to prevent complications. Treatment for this condition may vary depending on how severe your symptoms are. Your health care provider may recommend:  Changes to your diet.  Medicine.  Surgery. Follow these instructions at home: Eating and drinking   Follow a diet as recommended by your health care provider. This may involve avoiding foods and drinks such as: ? Coffee and tea (with or without caffeine). ? Drinks that containalcohol. ? Energy drinks and sports drinks. ? Carbonated drinks or sodas. ? Chocolate and cocoa. ? Peppermint and mint flavorings. ? Garlic and onions. ? Horseradish. ? Spicy and acidic foods, including peppers, chili powder, curry powder, vinegar, hot sauces, and barbecue sauce. ? Citrus  fruit juices and citrus fruits, such as oranges, lemons, and limes. ? Tomato-based foods, such as red sauce, chili, salsa, and pizza with red sauce. ? Fried and fatty foods, such as donuts, french fries, potato chips, and high-fat dressings. ? High-fat  meats, such as hot dogs and fatty cuts of red and white meats, such as rib eye steak, sausage, ham, and bacon. ? High-fat dairy items, such as whole milk, butter, and cream cheese.  Eat small, frequent meals instead of large meals.  Avoid drinking large amounts of liquid with your meals.  Avoid eating meals during the 2-3 hours before bedtime.  Avoid lying down right after you eat.  Do not exercise right after you eat. Lifestyle   Do not use any products that contain nicotine or tobacco, such as cigarettes, e-cigarettes, and chewing tobacco. If you need help quitting, ask your health care provider.  Try to reduce your stress by using methods such as yoga or meditation. If you need help reducing stress, ask your health care provider.  If you are overweight, reduce your weight to an amount that is healthy for you. Ask your health care provider for guidance about a safe weight loss goal. General instructions  Pay attention to any changes in your symptoms.  Take over-the-counter and prescription medicines only as told by your health care provider. Do not take aspirin, ibuprofen, or other NSAIDs unless your health care provider told you to do so.  Wear loose-fitting clothing. Do not wear anything tight around your waist that causes pressure on your abdomen.  Raise (elevate) the head of your bed about 6 inches (15 cm).  Avoid bending over if this makes your symptoms worse.  Keep all follow-up visits as told by your health care provider. This is important. Contact a health care provider if:  You have: ? New symptoms. ? Unexplained weight loss. ? Difficulty swallowing or it hurts to swallow. ? Wheezing or a persistent cough. ? A hoarse voice.  Your symptoms do not improve with treatment. Get help right away if you:  Have pain in your arms, neck, jaw, teeth, or back.  Feel sweaty, dizzy, or light-headed.  Have chest pain or shortness of breath.  Vomit and your vomit looks  like blood or coffee grounds.  Faint.  Have stool that is bloody or black.  Cannot swallow, drink, or eat. Summary  Gastroesophageal reflux happens when acid from the stomach flows up into the esophagus. GERD is a disease in which the reflux happens often, causes frequent or severe symptoms, or causes problems such as damage to the esophagus.  Treatment for this condition may vary depending on how severe your symptoms are. Your health care provider may recommend diet and lifestyle changes, medicine, or surgery.  Contact a health care provider if you have new or worsening symptoms.  Take over-the-counter and prescription medicines only as told by your health care provider. Do not take aspirin, ibuprofen, or other NSAIDs unless your health care provider told you to do so.  Keep all follow-up visits as told by your health care provider. This is important. This information is not intended to replace advice given to you by your health care provider. Make sure you discuss any questions you have with your health care provider. Document Revised: 04/12/2018 Document Reviewed: 04/12/2018 Elsevier Patient Education  2020 [place] for Gastroesophageal Reflux Disease, Adult When you have gastroesophageal reflux disease (GERD), the foods you eat and your eating habits are very important.  Choosing the right foods can help ease the discomfort of GERD. Consider working with a diet and nutrition specialist (dietitian) to help you make healthy food choices. What general guidelines should I follow?  Eating plan  Choose healthy foods low in fat, such as fruits, vegetables, whole grains, low-fat dairy products, and lean meat, fish, and poultry.  Eat frequent, small meals instead of three large meals each day. Eat your meals slowly, in a relaxed setting. Avoid bending over or lying down until 2-3 hours after eating.  Limit high-fat foods such as fatty meats or fried foods.  Limit your  intake of oils, butter, and shortening to less than 8 teaspoons each day.  Avoid the following: ? Foods that cause symptoms. These may be different for different people. Keep a food diary to keep track of foods that cause symptoms. ? Alcohol. ? Drinking large amounts of liquid with meals. ? Eating meals during the 2-3 hours before bed.  Cook foods using methods other than frying. This may include baking, grilling, or broiling. Lifestyle  Maintain a healthy weight. Ask your health care provider what weight is healthy for you. If you need to lose weight, work with your health care provider to do so safely.  Exercise for at least 30 minutes on 5 or more days each week, or as told by your health care provider.  Avoid wearing clothes that fit tightly around your waist and chest.  Do not use any products that contain nicotine or tobacco, such as cigarettes and e-cigarettes. If you need help quitting, ask your health care provider.  Sleep with the head of your bed raised. Use a wedge under the mattress or blocks under the bed frame to raise the head of the bed. What foods are not recommended? The items listed may not be a complete list. Talk with your dietitian about what dietary choices are best for you. Grains Pastries or quick breads with added fat. [place] toast. Vegetables Deep fried vegetables. [place] fries. Any vegetables prepared with added fat. Any vegetables that cause symptoms. For some people this may include tomatoes and tomato products, chili peppers, onions and garlic, and horseradish. Fruits Any fruits prepared with added fat. Any fruits that cause symptoms. For some people this may include citrus fruits, such as oranges, grapefruit, pineapple, and lemons. Meats and other protein foods High-fat meats, such as fatty beef or pork, hot dogs, ribs, ham, sausage, salami and bacon. Fried meat or protein, including fried fish and fried chicken. Nuts and nut butters. Dairy Whole milk  and chocolate milk. Sour cream. Cream. Ice cream. Cream cheese. Milk shakes. Beverages Coffee and tea, with or without caffeine. Carbonated beverages. Sodas. Energy drinks. Fruit juice made with acidic fruits (such as orange or grapefruit). Tomato juice. Alcoholic drinks. Fats and oils Butter. Margarine. Shortening. Ghee. Sweets and desserts Chocolate and cocoa. Donuts. Seasoning and other foods Pepper. Peppermint and spearmint. Any condiments, herbs, or seasonings that cause symptoms. For some people, this may include curry, hot sauce, or vinegar-based salad dressings. Summary  When you have gastroesophageal reflux disease (GERD), food and lifestyle choices are very important to help ease the discomfort of GERD.  Eat frequent, small meals instead of three large meals each day. Eat your meals slowly, in a relaxed setting. Avoid bending over or lying down until 2-3 hours after eating.  Limit high-fat foods such as fatty meat or fried foods. This information is not intended to replace advice given to you by your health care provider.  Make sure you discuss any questions you have with your health care provider. Document Revised: 01/25/2019 Document Reviewed: 10/05/2016 Elsevier Patient Education  2020 [employer].   Cholesterol Content in Foods Cholesterol is a waxy, fat-like substance that helps to carry fat in the blood. The body needs cholesterol in small amounts, but too much cholesterol can cause damage to the arteries and heart. Most people should eat less than 200 milligrams (mg) of cholesterol a day. Foods with cholesterol  Cholesterol is found in animal-based foods, such as meat, seafood, and dairy. Generally, low-fat dairy and lean meats have less cholesterol than full-fat dairy and fatty meats. The milligrams of cholesterol per serving (mg per serving) of common cholesterol-containing foods are listed below. Meat and other proteins  Egg -- one large whole egg has 186 mg.  Veal  shank -- 4 oz has 141 mg.  Lean ground [place] (93% lean) -- 4 oz has 118 mg.  Fat-trimmed lamb loin -- 4 oz has 106 mg.  Lean ground beef (90% lean) -- 4 oz has 100 mg.  Lobster -- 3.5 oz has 90 mg.  Pork loin chops -- 4 oz has 86 mg.  Canned salmon -- 3.5 oz has 83 mg.  Fat-trimmed beef top loin -- 4 oz has 78 mg.  Frankfurter -- 1 frank (3.5 oz) has 77 mg.  Crab -- 3.5 oz has 71 mg.  Roasted chicken without skin, white meat -- 4 oz has 66 mg.  Light bologna -- 2 oz has 45 mg.  Deli-cut [place] -- 2 oz has 31 mg.  Canned tuna -- 3.5 oz has 31 mg.  Tomasa Blase -- 1 oz has 29 mg.  Oysters and mussels (raw) -- 3.5 oz has 25 mg.  Mackerel -- 1 oz has 22 mg.  Trout -- 1 oz has 20 mg.  Pork sausage -- 1 link (1 oz) has 17 mg.  Salmon -- 1 oz has 16 mg.  Tilapia -- 1 oz has 14 mg. Dairy  Soft-serve ice cream --  cup (4 oz) has 103 mg.  Whole-milk yogurt -- 1 cup (8 oz) has 29 mg.  Cheddar cheese -- 1 oz has 28 mg.  American cheese -- 1 oz has 28 mg.  Whole milk -- 1 cup (8 oz) has 23 mg.  2% milk -- 1 cup (8 oz) has 18 mg.  Cream cheese -- 1 tablespoon (Tbsp) has 15 mg.  Cottage cheese --  cup (4 oz) has 14 mg.  Low-fat (1%) milk -- 1 cup (8 oz) has 10 mg.  Sour cream -- 1 Tbsp has 8.5 mg.  Low-fat yogurt -- 1 cup (8 oz) has 8 mg.  Nonfat Greek yogurt -- 1 cup (8 oz) has 7 mg.  Half-and-half cream -- 1 Tbsp has 5 mg. Fats and oils  Cod liver oil -- 1 tablespoon (Tbsp) has 82 mg.  Butter -- 1 Tbsp has 15 mg.  Lard -- 1 Tbsp has 14 mg.  Bacon grease -- 1 Tbsp has 14 mg.  Mayonnaise -- 1 Tbsp has 5-10 mg.  Margarine -- 1 Tbsp has 3-10 mg. Exact amounts of cholesterol in these foods may vary depending on specific ingredients and brands. Foods without cholesterol Most plant-based foods do not have cholesterol unless you combine them with a food that has cholesterol. Foods without cholesterol include:  Grains and  cereals.  Vegetables.  Fruits.  Vegetable oils, such as olive, canola, and sunflower oil.  Legumes, such as peas, beans, and lentils.  Nuts and seeds.  Egg whites. Summary  The body needs cholesterol in small amounts, but too much cholesterol can cause damage to the arteries and heart.  Most people should eat less than 200 milligrams (mg) of cholesterol a day. This information is not intended to replace advice given to you by your health care provider. Make sure you discuss any questions you have with your health care provider. Document Revised: 09/16/2017 Document Reviewed: 05/31/2017 Elsevier Patient Education  2020 [employer]   Bunion  A bunion is a bump on the base of the big toe that forms when the bones of the big toe joint move out of position. Bunions may be small at first, but they often get larger over time. They can make walking painful. What are the causes? A bunion may be caused by:  Wearing narrow or pointed shoes that force the big toe to press against the other toes.  Abnormal foot development that causes the foot to roll inward (pronate).  Changes in the foot that are caused by certain diseases, such as rheumatoid arthritis or polio.  A foot injury. What increases the risk? The following factors may make you more likely to develop this condition:  Wearing shoes that squeeze the toes together.  Having certain diseases, such as: ? Rheumatoid arthritis. ? Polio. ? Cerebral palsy.  Having family members who have bunions.  Being born with a foot deformity, such as flat feet or low arches.  Doing activities that put a lot of pressure on the feet, such as ballet dancing. What are the signs or symptoms? The main symptom of a bunion is a noticeable bump on the big toe. Other symptoms may include:  Pain.  Swelling around the big toe.  Redness and inflammation.  Thick or hardened skin on the big toe or between the toes.  Stiffness or loss of  motion in the big toe.  Trouble with walking. How is this diagnosed? A bunion may be diagnosed based on your symptoms, medical history, and activities. You may have tests, such as:  X-rays. These allow your health care provider to check the position of the bones in your foot and look for damage to your joint. They also help your health care provider determine the severity of your bunion and the best way to treat it.  Joint aspiration. In this test, a sample of fluid is removed from the toe joint. This test may be done if you are in a lot of pain. It helps rule out diseases that cause painful swelling of the joints, such as arthritis. How is this treated? Treatment depends on the severity of your symptoms. The goal of treatment is to relieve symptoms and prevent the bunion from getting worse. Your health care provider may recommend:  Wearing shoes that have a wide toe box.  Using bunion pads to cushion the affected area.  Taping your toes together to keep them in a normal position.  Placing a device inside your shoe (orthotics) to help reduce pressure on your toe joint.  Taking medicine to ease pain, inflammation, and swelling.  Applying heat or ice to the affected area.  Doing stretching exercises.  Surgery to remove scar tissue and move the toes back into their normal position. This treatment is rare. Follow these instructions at home: Managing pain, stiffness, and swelling   If directed, put ice on the painful area: ? Put ice in a plastic bag. ? Place a towel between your skin and the bag. ?  Leave the ice on for 20 minutes, 2-3 times a day. Activity   If directed, apply heat to the affected area before you exercise. Use the heat source that your health care provider recommends, such as a moist heat pack or a heating pad. ? Place a towel between your skin and the heat source. ? Leave the heat on for 20-30 minutes. ? Remove the heat if your skin turns bright red. This is  especially important if you are unable to feel pain, heat, or cold. You may have a greater risk of getting burned.  Do exercises as told by your health care provider. General instructions  Support your toe joint with proper footwear, shoe padding, or taping as told by your health care provider.  Take over-the-counter and prescription medicines only as told by your health care provider.  Keep all follow-up visits as told by your health care provider. This is important. Contact a health care provider if your symptoms:  Get worse.  Do not improve in 2 weeks. Get help right away if you have:  Severe pain and trouble with walking. Summary  A bunion is a bump on the base of the big toe that forms when the bones of the big toe joint move out of position.  Bunions can make walking painful.  Treatment depends on the severity of your symptoms.  Support your toe joint with proper footwear, shoe padding, or taping as told by your health care provider. This information is not intended to replace advice given to you by your health care provider. Make sure you discuss any questions you have with your health care provider. Document Revised: 04/10/2018 Document Reviewed: 02/14/2018 Elsevier Patient Education  2020 [employer]

## 2020-07-31 NOTE — Progress Notes (Signed)
Chief Complaint  Patient presents with  . Follow-up  . Medication Management  . Immunizations  . Gastroesophageal Reflux   Annual doing well  S/p covid 06/25/20 doing well, sister who was 35 y.o pregnant also had covid but doing well    gerd wants to get back on nexium 40 mg qd and wants to resume and will change diet gerd worse x 2 weeks tried tums and otc nexium 20 mg qd   Anxiety better on prozac 10 but having some fatigue and increased appetite will try to change the medication to bedtime   Review of Systems  Constitutional: Positive for malaise/fatigue. Negative for weight loss.  HENT: Negative for hearing loss.   Eyes: Negative for blurred vision.  Respiratory: Negative for shortness of breath.   Cardiovascular: Negative for chest pain.  Gastrointestinal: Positive for constipation and heartburn.  Musculoskeletal: Negative for falls.  Skin: Negative for rash.  Neurological: Negative for headaches.  Psychiatric/Behavioral: Negative for depression.       Anxiety improved    Past Medical History:  Diagnosis Date  . Anxiety   . Colon polyp    Benign, colonoscopy 2015  . COVID-19    06/24/20  . GERD (gastroesophageal reflux disease)   . Globus sensation   . History of chicken pox   . Lymph node enlargement   . Wears contact lenses    Past Surgical History:  Procedure Laterality Date  . COLONOSCOPY WITH ESOPHAGOGASTRODUODENOSCOPY (EGD)    . ESOPHAGOGASTRODUODENOSCOPY (EGD) WITH PROPOFOL N/A 10/21/2017   Procedure: ESOPHAGOGASTRODUODENOSCOPY (EGD) WITH PROPOFOL;  Surgeon: Lucilla Lame, MD;  Location: [place] [facility];  Service: Endoscopy;  Laterality: N/A;  . FINGER SURGERY     fracture  . FLEXIBLE BRONCHOSCOPY N/A 05/29/2018   Procedure: FLEXIBLE BRONCHOSCOPY;  Surgeon: Laverle Hobby, MD;  Location: [facility];  Service: Pulmonary;  Laterality: N/A;  . TONSILLECTOMY    . WISDOM TOOTH EXTRACTION     Family History  Problem Relation Age of Onset  . Thyroid  disease Mother        Living  . Goiter Mother   . Cirrhosis Father 38       Deceased  . Alcohol abuse Father        liver cirrhosis   . Cancer Paternal Grandfather        Lung Cancer  . Cancer Maternal Grandfather   . Lymphoma Sister        Neck  . Melanoma Sister   . Skin cancer Sister    Social History   Socioeconomic History  . Marital status: Married    Spouse name: Not on file  . Number of children: Not on file  . Years of education: Not on file  . Highest education level: Not on file  Occupational History  . Occupation: Recruitment consultant  Tobacco Use  . Smoking status: Never Smoker  . Smokeless tobacco: Never Used  Vaping Use  . Vaping Use: Never used  Substance and Sexual Activity  . Alcohol use: Yes    Alcohol/week: 0.0 standard drinks    Comment: rare  . Drug use: No  . Sexual activity: Yes    Partners: Female  Other Topics Concern  . Not on file  Social History Narrative   Married wife is Paramedic   1 son 2 y.o as of 08/16/18    Works Mudlogger   Social Determinants of Radio broadcast assistant Strain:   . Difficulty of Paying Living Expenses:  Not on file  Food Insecurity:   . Worried About Charity fundraiser in the Last Year: Not on file  . Ran Out of Food in the Last Year: Not on file  Transportation Needs:   . Lack of Transportation (Medical): Not on file  . Lack of Transportation (Non-Medical): Not on file  Physical Activity:   . Days of Exercise per Week: Not on file  . Minutes of Exercise per Session: Not on file  Stress:   . Feeling of Stress : Not on file  Social Connections:   . Frequency of Communication with Friends and Family: Not on file  . Frequency of Social Gatherings with Friends and Family: Not on file  . Attends Religious Services: Not on file  . Active Member of Clubs or Organizations: Not on file  . Attends Archivist Meetings: Not on file  . Marital Status: Not on file  Intimate  Partner Violence:   . Fear of Current or Ex-Partner: Not on file  . Emotionally Abused: Not on file  . Physically Abused: Not on file  . Sexually Abused: Not on file   Current Meds  Medication Sig  . FLUoxetine (PROZAC) 10 MG capsule Take 1 capsule (10 mg total) by mouth daily.  . fluticasone (FLONASE) 50 MCG/ACT nasal spray Place 2 sprays into both nostrils daily.   No Known Allergies Recent Results (from the past 2160 hour(s))  Novel Coronavirus, NAA ([facility])     Status: Abnormal   Collection Time: 06/25/20 12:04 PM   Specimen: Nasopharyngeal(NP) swabs in vial transport medium   Nasopharynge  Screenin  Result Value Ref Range   SARS-CoV-2, NAA Detected (A) Not Detected    Comment: Patients who have a positive COVID-19 test result may now have treatment options. Treatment options are available for patients with mild to moderate symptoms and for hospitalized patients. Visit our website at [email or web address] for resources and information. This nucleic acid amplification test was developed and its performance characteristics determined by [employer]. Nucleic acid amplification tests include RT-PCR and TMA. This test has not been FDA cleared or approved. This test has been authorized by FDA under an Emergency Use Authorization (EUA). This test is only authorized for the duration of time the declaration that circumstances exist justifying the authorization of the emergency use of in vitro diagnostic tests for detection of SARS-CoV-2 virus and/or diagnosis of COVID-19 infection under section 564(b)(1) of the Act, 21 U.S.C. 062BJS-2(G) (1), unless the authorization is terminated or revoked sooner. When diagnostic testing is negativ e, the possibility of a false negative result should be considered in the context of a patient's recent exposures and the presence of clinical signs and symptoms consistent with COVID-19. An individual without symptoms of  COVID-19 and who is not shedding SARS-CoV-2 virus would expect to have a negative (not detected) result in this assay.   SARS-COV-2, NAA 2 DAY TAT     Status: None   Collection Time: 06/25/20 12:04 PM   Nasopharynge  Screenin  Result Value Ref Range   SARS-CoV-2, NAA 2 DAY TAT Performed    Objective  Body mass index is 24.9 kg/m. Wt Readings from Last 3 Encounters:  07/31/20 199 lb 3.2 oz (90.4 kg)  05/16/20 200 lb (90.7 kg)  11/07/19 205 lb (93 kg)   Temp Readings from Last 3 Encounters:  07/31/20 98.1 F (36.7 C) (Oral)  08/07/19 (!) 97.3 F (36.3 C) (Skin)  06/28/19 (!) 97.3 F (36.3 C) (Temporal)  BP Readings from Last 3 Encounters:  07/31/20 126/70  11/07/19 (!) 133/97  08/07/19 122/68   Pulse Readings from Last 3 Encounters:  07/31/20 81  08/07/19 78  06/28/19 91    Physical Exam Vitals and nursing note reviewed.  Constitutional:      Appearance: Normal appearance. He is well-developed and well-groomed.  HENT:     Head: Normocephalic and atraumatic.  Eyes:     Conjunctiva/sclera: Conjunctivae normal.     Pupils: Pupils are equal, round, and reactive to light.  Cardiovascular:     Rate and Rhythm: Normal rate and regular rhythm.     Heart sounds: Normal heart sounds. No murmur heard.   Pulmonary:     Effort: Pulmonary effort is normal.     Breath sounds: Normal breath sounds.  Abdominal:     General: Abdomen is flat. Bowel sounds are normal.     Tenderness: There is no abdominal tenderness.  Skin:    General: Skin is warm and dry.  Neurological:     General: No focal deficit present.     Mental Status: He is alert and oriented to person, place, and time. Mental status is at baseline.     Gait: Gait normal.  Psychiatric:        Attention and Perception: Attention and perception normal.        Mood and Affect: Mood and affect normal.        Speech: Speech normal.        Behavior: Behavior normal. Behavior is cooperative.        Thought Content:  Thought content normal.        Cognition and Memory: Cognition and memory normal.        Judgment: Judgment normal.     Assessment  Plan  Annual physical exam Needs flu shot - Plan: Flu Vaccine QUAD 6+ mos PF IM (Fluarix Quad PF) Flu shot had utd today  Tdap had10/1/13 Pfizer 1/2, had 2/2 disc booster Never had HPV shot Hep B immune MMRrec given Rxprev  Colonoscopy due age 70 Saw derm 2-3 years agofrom 2018/2019mom h/o BCC shoulder Saw in 05/2020 VV left hand no tx tbse Dr. Jarome Matin [place] derm  PSA due age 7 baseline   Had EGD x 2 and colonoscopies x 2 in the past Dr. Allen Norris  rec healthy diet and exercise  Generalized anxiety disorder Cont prozac 10 mg qd   Gastroesophageal reflux disease without esophagitis - Plan: esomeprazole (NEXIUM) 40 MG capsule  Consider pepcid otc as well    Sees Dr. Redmond Baseman ENT rec vitamin D3 otc 2000 IU daily TMJ specialist Dr. Sanjuan Dame [place]   Provider: Dr. Olivia Mackie McLean-Scocuzza-Internal Medicine

## 2020-08-21 ENCOUNTER — Encounter: Payer: PRIVATE HEALTH INSURANCE | Admitting: Podiatry

## 2020-09-01 ENCOUNTER — Encounter: Payer: Self-pay | Admitting: Internal Medicine

## 2020-09-05 ENCOUNTER — Other Ambulatory Visit: Payer: Self-pay | Admitting: Internal Medicine

## 2020-09-05 DIAGNOSIS — J321 Chronic frontal sinusitis: Secondary | ICD-10-CM

## 2020-09-05 MED ORDER — AZITHROMYCIN 250 MG PO TABS: ORAL_TABLET | ORAL | 0 refills | Status: DC

## 2020-09-09 ENCOUNTER — Telehealth: Payer: Self-pay | Admitting: Internal Medicine

## 2020-09-09 NOTE — Telephone Encounter (Signed)
Pa form printed form benefit website and filled out. Placed on Dr French Ana McLean-Scocuzza desk.   PA through Cover my meds would not go through and plan does not accept them over the phone.

## 2020-09-10 ENCOUNTER — Telehealth: Payer: Self-pay | Admitting: Internal Medicine

## 2020-09-10 NOTE — Telephone Encounter (Signed)
Faxed medication prior authorization request form to [employer] for Esomeprazole 40 mg capsule 90 day take 1 capsule at night reason for medication Gastroesophageal reflux disease without esophagitis faxed on 09-10-2020

## 2020-09-22 ENCOUNTER — Encounter: Payer: Self-pay | Admitting: Internal Medicine

## 2020-09-22 NOTE — Telephone Encounter (Signed)
Patient states he already has this medication and states it cost him around $20 for 90 day supply.   Patient states it is not helping much. Patient is in between insurances and will schedule an appointment with ENT when his new insurance kicks in.   Patient wanting a mychart message sent to him to let him know if he can start taking this medication twice daily.  Please advise

## 2020-09-22 NOTE — Telephone Encounter (Signed)
Prior authorization has been denied.  According to Dr French Ana McLean-Scocuzza does Patient want to try another medication?

## 2020-12-01 ENCOUNTER — Other Ambulatory Visit: Payer: Self-pay

## 2020-12-01 ENCOUNTER — Ambulatory Visit: Payer: BC Managed Care – PPO | Admitting: Gastroenterology

## 2020-12-01 ENCOUNTER — Encounter: Payer: Self-pay | Admitting: Gastroenterology

## 2020-12-01 VITALS — BP 117/67 | HR 80 | Temp 98.7°F | Ht 75.0 in | Wt 194.2 lb

## 2020-12-01 DIAGNOSIS — K219 Gastro-esophageal reflux disease without esophagitis: Secondary | ICD-10-CM

## 2020-12-01 MED ORDER — DEXLANSOPRAZOLE 60 MG PO CPDR: 60.0000 mg | DELAYED_RELEASE_CAPSULE | Freq: Every day | ORAL | 6 refills | Status: DC

## 2020-12-01 NOTE — Progress Notes (Signed)
Gastroenterology Consultation  Referring Provider:     McLean-Scocuzza, Olivia Mackie * Primary Care Physician:  McLean-Scocuzza, Nino Glow, MD Primary Gastroenterologist:  Dr. Allen Norris     Reason for Consultation:     GERD        HPI:   Adam Bishop is a 35 y.o. y/o male referred for consultation & management of GERD by Dr. Terese Door, Nino Glow, MD.  This patient comes in today and to see me in the past for a history of gastric esophageal reflux disease and irritable bowel syndrome.  The patient has had an upper endoscopy in the past which was normal.  He also appears to have had a colonoscopy in the past by Dr. Thana Farr.  He reports that he was evaluated by ENT and suggested that he may have reflux as a cause of his issues.  The patient states that he was taking omeprazole 40 mg twice a day and states that his symptoms were pretty much under control.  The patient states that his diet and the fact that he went down to Nexium once a day resulted in him having more symptoms.  The patient reports that he wakes up and has to cough up phlegm.  He also reports that he has a full feeling in his throat. The patient denies any unexplained weight loss fevers chills nausea or vomiting.  Past Medical History:  Diagnosis Date  . Anxiety   . Colon polyp    Benign, colonoscopy 2015  . COVID-19    06/24/20  . GERD (gastroesophageal reflux disease)   . Globus sensation   . History of chicken pox   . Lymph node enlargement   . Wears contact lenses     Past Surgical History:  Procedure Laterality Date  . COLONOSCOPY WITH ESOPHAGOGASTRODUODENOSCOPY (EGD)    . ESOPHAGOGASTRODUODENOSCOPY (EGD) WITH PROPOFOL N/A 10/21/2017   Procedure: ESOPHAGOGASTRODUODENOSCOPY (EGD) WITH PROPOFOL;  Surgeon: Lucilla Lame, MD;  Location: [place];  Service: Endoscopy;  Laterality: N/A;  . FINGER SURGERY     fracture  . FLEXIBLE BRONCHOSCOPY N/A 05/29/2018   Procedure: FLEXIBLE BRONCHOSCOPY;  Surgeon: Laverle Hobby, MD;  Location: [facility];  Service: Pulmonary;  Laterality: N/A;  . TONSILLECTOMY    . WISDOM TOOTH EXTRACTION      Prior to Admission medications   Medication Sig Start Date End Date Taking? Authorizing Provider  dexlansoprazole (DEXILANT) 60 MG capsule Take 1 capsule (60 mg total) by mouth daily. 12/01/20  Yes Lucilla Lame, MD  esomeprazole (NEXIUM) 40 MG capsule Take 1 capsule (40 mg total) by mouth at bedtime. 30 min before dinner as needed 07/31/20  Yes McLean-Scocuzza, Nino Glow, MD  fluticasone (FLONASE) 50 MCG/ACT nasal spray Place 2 sprays into both nostrils daily. 08/07/19  Yes Guse, Jacquelynn Cree, FNP    Family History  Problem Relation Age of Onset  . Thyroid disease Mother        Living  . Goiter Mother   . Cirrhosis Father 21       Deceased  . Alcohol abuse Father        liver cirrhosis   . Cancer Paternal Grandfather        Lung Cancer  . Cancer Maternal Grandfather   . Lymphoma Sister        Neck  . Melanoma Sister   . Skin cancer Sister      Social History   Tobacco Use  . Smoking status: Never Smoker  . Smokeless tobacco: Never  Used  Vaping Use  . Vaping Use: Never used  Substance Use Topics  . Alcohol use: Yes    Alcohol/week: 0.0 standard drinks    Comment: rare  . Drug use: No    Allergies as of 12/01/2020  . (No Known Allergies)    Review of Systems:    All systems reviewed and negative except where noted in HPI.   Physical Exam:  BP 117/67   Pulse 80   Temp 98.7 F (37.1 C) (Temporal)   Ht _0  (1.905 m)   Wt 194 lb 3.2 oz (88.1 kg)   BMI 24.27 kg/m  No LMP for male patient. General:   Alert,  Well-developed, well-nourished, pleasant and cooperative in NAD Head:  Normocephalic and atraumatic. Eyes:  Sclera clear, no icterus.   Conjunctiva pink. Ears:  Normal auditory acuity. Neck:  Supple; no masses or thyromegaly. Lungs:  Respirations even and unlabored.  Clear throughout to auscultation.   No wheezes, crackles, or rhonchi. No  acute distress. Heart:  Regular rate and rhythm; no murmurs, clicks, rubs, or gallops. Abdomen:  Normal bowel sounds.  No bruits.  Soft, non-tender and non-distended without masses, hepatosplenomegaly or hernias noted.  No guarding or rebound tenderness.  Negative Carnett sign.   Rectal:  Deferred.  Pulses:  Normal pulses noted. Extremities:  No clubbing or edema.  No cyanosis. Neurologic:  Alert and oriented x3;  grossly normal neurologically. Skin:  Intact without significant lesions or rashes.  No jaundice. Lymph Nodes:  No significant cervical adenopathy. Psych:  Alert and cooperative. Normal mood and affect.  Imaging Studies: No results found.  Assessment and Plan:   Adam Bishop is a 35 y.o. y/o male who comes in today with worsening symptoms when changing his Nexium from twice a day once today. The patient states that he was concerned since his symptoms came back.  The patient has been told that we can try switching him to [place] which has better 24-hour control with only once dosing and he should consider antireflux surgery since he may need to be on this medication for a long period of time thereby increasing the risks of the medication.  The patient would like to try the medication and see how he does.  If he decides to undergo antireflux surgery he has been told to contact me whereupon I can refer him to a surgeon for evaluation for antireflux surgery.  The patient has been explained the plan and agrees with it.    Lucilla Lame, MD. Marval Regal    Note: This dictation was prepared with Dragon dictation along with smaller phrase technology. Any transcriptional errors that result from this process are unintentional.

## 2020-12-10 ENCOUNTER — Encounter: Payer: Self-pay | Admitting: Internal Medicine

## 2020-12-12 ENCOUNTER — Other Ambulatory Visit: Payer: Self-pay

## 2020-12-12 ENCOUNTER — Other Ambulatory Visit (INDEPENDENT_AMBULATORY_CARE_PROVIDER_SITE_OTHER): Payer: BC Managed Care – PPO

## 2020-12-12 DIAGNOSIS — E785 Hyperlipidemia, unspecified: Secondary | ICD-10-CM | POA: Diagnosis not present

## 2020-12-12 DIAGNOSIS — Z0000 Encounter for general adult medical examination without abnormal findings: Secondary | ICD-10-CM | POA: Diagnosis not present

## 2020-12-12 DIAGNOSIS — Z1329 Encounter for screening for other suspected endocrine disorder: Secondary | ICD-10-CM

## 2020-12-12 DIAGNOSIS — Z1389 Encounter for screening for other disorder: Secondary | ICD-10-CM

## 2020-12-12 LAB — COMPREHENSIVE METABOLIC PANEL
ALT: 36 U/L (ref 0–53)
AST: 25 U/L (ref 0–37)
Albumin: 4.5 g/dL (ref 3.5–5.2)
Alkaline Phosphatase: 70 U/L (ref 39–117)
BUN: 14 mg/dL (ref 6–23)
CO2: 32 mEq/L (ref 19–32)
Calcium: 10.1 mg/dL (ref 8.4–10.5)
Chloride: 101 mEq/L (ref 96–112)
Creatinine, Ser: 0.85 mg/dL (ref 0.40–1.50)
GFR: 112.49 mL/min (ref >60.00)
Glucose, Bld: 76 mg/dL (ref 70–99)
Potassium: 4.7 mEq/L (ref 3.5–5.1)
Sodium: 139 mEq/L (ref 135–145)
Total Bilirubin: 1 mg/dL (ref 0.2–1.2)
Total Protein: 7 g/dL (ref 6.0–8.3)

## 2020-12-12 LAB — CBC WITH DIFFERENTIAL/PLATELET
Basophils Absolute: 0 10*3/uL (ref 0.0–0.1)
Basophils Relative: 0.4 % (ref 0.0–3.0)
Eosinophils Absolute: 0.1 10*3/uL (ref 0.0–0.7)
Eosinophils Relative: 1 % (ref 0.0–5.0)
HCT: 43.4 % (ref 39.0–52.0)
Hemoglobin: 14.7 g/dL (ref 13.0–17.0)
Lymphocytes Relative: 42.6 % (ref 12.0–46.0)
Lymphs Abs: 2.3 10*3/uL (ref 0.7–4.0)
MCHC: 33.9 g/dL (ref 30.0–36.0)
MCV: 89.4 fl (ref 78.0–100.0)
Monocytes Absolute: 0.5 10*3/uL (ref 0.1–1.0)
Monocytes Relative: 8.7 % (ref 3.0–12.0)
Neutro Abs: 2.5 10*3/uL (ref 1.4–7.7)
Neutrophils Relative %: 47.3 % (ref 43.0–77.0)
Platelets: 222 10*3/uL (ref 150.0–400.0)
RBC: 4.86 Mil/uL (ref 4.22–5.81)
RDW: 13.1 % (ref 11.5–15.5)
WBC: 5.3 10*3/uL (ref 4.0–10.5)

## 2020-12-12 LAB — LIPID PANEL
Cholesterol: 186 mg/dL (ref 0–200)
HDL: 42.8 mg/dL (ref >39.00)
LDL Cholesterol: 134 mg/dL — ABNORMAL HIGH (ref 0–99)
NonHDL: 143.6
Total CHOL/HDL Ratio: 4
Triglycerides: 48 mg/dL (ref 0.0–149.0)
VLDL: 9.6 mg/dL (ref 0.0–40.0)

## 2020-12-12 LAB — TSH: TSH: 0.96 u[IU]/mL (ref 0.35–4.50)

## 2020-12-13 LAB — URINALYSIS, ROUTINE W REFLEX MICROSCOPIC
Bilirubin Urine: NEGATIVE
Glucose, UA: NEGATIVE
Hgb urine dipstick: NEGATIVE
Ketones, ur: NEGATIVE
Leukocytes,Ua: NEGATIVE
Nitrite: NEGATIVE
Protein, ur: NEGATIVE
Specific Gravity, Urine: 1.027 (ref 1.001–1.03)
pH: 5.5 (ref 5.0–8.0)

## 2020-12-16 ENCOUNTER — Ambulatory Visit: Payer: BC Managed Care – PPO | Admitting: Internal Medicine

## 2020-12-29 ENCOUNTER — Other Ambulatory Visit: Payer: PRIVATE HEALTH INSURANCE

## 2021-04-14 ENCOUNTER — Ambulatory Visit: Payer: No Typology Code available for payment source | Admitting: Internal Medicine

## 2021-04-14 ENCOUNTER — Encounter: Payer: Self-pay | Admitting: Internal Medicine

## 2021-04-14 ENCOUNTER — Ambulatory Visit (INDEPENDENT_AMBULATORY_CARE_PROVIDER_SITE_OTHER): Payer: No Typology Code available for payment source

## 2021-04-14 ENCOUNTER — Other Ambulatory Visit: Payer: Self-pay

## 2021-04-14 DIAGNOSIS — M419 Scoliosis, unspecified: Secondary | ICD-10-CM

## 2021-04-14 DIAGNOSIS — R109 Unspecified abdominal pain: Secondary | ICD-10-CM

## 2021-04-14 DIAGNOSIS — K581 Irritable bowel syndrome with constipation: Secondary | ICD-10-CM

## 2021-04-14 DIAGNOSIS — M545 Low back pain, unspecified: Secondary | ICD-10-CM | POA: Insufficient documentation

## 2021-04-14 MED ORDER — CYCLOBENZAPRINE HCL 10 MG PO TABS
10.0000 mg | ORAL_TABLET | Freq: Every evening | ORAL | 5 refills | Status: DC | PRN
Start: 2021-04-14 — End: 2021-07-23

## 2021-04-14 NOTE — Patient Instructions (Addendum)
Thoracic Strain Rehab Ask your health care provider which exercises are safe for you. Do exercises exactly as told by your health care provider and adjust them as directed. It is normal to feel mild stretching, pulling, tightness, or discomfort as you do these exercises. Stop right away if you feel sudden pain or your pain gets worse. Do not begin these exercises until told by your health care provider. Stretching and range-of-motion exercise This exercise warms up your muscles and joints and improves the movement and flexibility of your back and shoulders. This exercise also helps to relievepain. Chest and spine stretch  Lie down on your back on a firm surface. Roll a towel or a small blanket so it is about 4 inches (10 cm) in diameter. Put the towel lengthwise under the middle of your back so it is under your spine, but not under your shoulder blades. Put your hands behind your head and let your elbows fall to your sides. This will increase your stretch. Take a deep breath (inhale). Hold for __________ seconds. Relax after you breathe out (exhale). Repeat __________ times. Complete this exercise __________ times a day. Strengthening exercises These exercises build strength and endurance in your back and your shoulder blade muscles. Endurance is the ability to use your muscles for a long time,even after they get tired. Alternating arm and leg raises  Get on your hands and knees on a firm surface. If you are on a hard floor, you may want to use padding, such as an exercise mat, to cushion your knees. Line up your arms and legs. Your hands should be directly below your shoulders, and your knees should be directly below your hips. Lift your left leg behind you. At the same time, raise your right arm and straighten it in front of you. Do not lift your leg higher than your hip. Do not lift your arm higher than your shoulder. Keep your abdominal and back muscles tight. Keep your hips facing the  ground. Do not arch your back. Keep your balance carefully, and do not hold your breath. Hold for __________ seconds. Slowly return to the starting position and repeat with your right leg and your left arm. Repeat __________ times. Complete this exercise __________ times a day. Straight arm rows This exercise is also called shoulder extension exercise. Stand with your feet shoulder width apart. Secure an exercise band to a stable object in front of you so the band is at or above shoulder height. Hold one end of the exercise band in each hand. Straighten your elbows and lift your hands up to shoulder height. Step back, away from the secured end of the exercise band, until the band stretches. Squeeze your shoulder blades together and pull your hands down to the sides of your thighs. Stop when your hands are straight down by your sides. This is shoulder extension. Do not let your hands go behind your body. Hold for __________ seconds. Slowly return to the starting position. Repeat __________ times. Complete this exercise __________ times a day. Prone shoulder external rotation Lie on your abdomen on a firm bed so your left / right forearm hangs over the edge of the bed and your upper arm is on the bed, straight out from your body. This is the prone position. Your elbow should be bent. Your palm should be facing your feet. If instructed, hold a __________ weight in your hand. Squeeze your shoulder blade toward the middle of your back. Do not let your shoulder  lift toward your ear. Keep your elbow bent in a 90-degree angle (right angle) while you slowly move your forearm up toward the ceiling. Move your forearm up to the height of the bed, toward your head. This is external rotation. Your upper arm should not move. At the top of the movement, your palm should face the floor. Hold for __________ seconds. Slowly return to the starting position and relax your muscles. Repeat __________ times.  Complete this exercise __________ times a day. Rowing scapular retraction This is an exercise in which the shoulder blades (scapulae) are pulled toward each other (retraction). Sit in a stable chair without armrests, or stand up. Secure an exercise band to a stable object in front of you so the band is at shoulder height. Hold one end of the exercise band in each hand. Your palms should face down. Bring your arms out straight in front of you. Step back, away from the secured end of the exercise band, until the band stretches. Pull the band backward. As you do this, bend your elbows and squeeze your shoulder blades together, but avoid letting the rest of your body move. Do not shrug your shoulders upward while you do this. Stop when your elbows are at your sides or slightly behind your body. Hold for __________ seconds. Slowly straighten your arms to return to the starting position. Repeat __________ times. Complete this exercise __________ times a day. Posture and body mechanics Good posture and healthy body mechanics can help to relieve stress in your body's tissues and joints. Body mechanics refers to the movements and positions of your body while you do your daily activities. Posture is part of body mechanics. Good posture means: Your spine is in its natural S-curve position (neutral). Your shoulders are pulled back slightly. Your head is not tipped forward. Follow these guidelines to improve your posture and body mechanics in youreveryday activities. Standing  When standing, keep your spine neutral and your feet about hip width apart. Keep a slight bend in your knees. Your ears, shoulders, and hips should line up with each other. When you do a task in which you lean forward while standing in one place for a long time, place one foot up on a stable object that is 2-4 inches (5-10 cm) high, such as a footstool. This helps keep your spine neutral.  Sitting  When sitting, keep your spine  neutral and keep your feet flat on the floor. Use a footrest, if necessary, and keep your thighs parallel to the floor. Avoid rounding your shoulders, and avoid tilting your head forward. When working at a desk or a computer, keep your desk at a height where your hands are slightly lower than your elbows. Slide your chair under your desk so you are close enough to maintain good posture. When working at a computer, place your monitor at a height where you are looking straight ahead and you do not have to tilt your head forward or downward to look at the screen.  Resting When lying down and resting, avoid positions that are most painful for you. If you have pain with activities such as sitting, bending, stooping, or squatting (flexion-basedactivities), lie in a position in which your body does not bend very much. For example, avoid curling up on your side with your arms and knees near your chest (fetal position). If you have pain with activities such as standing for a long time or reaching with your arms (extension-basedactivities), lie with your spine in a  neutral position and bend your knees slightly. Try the following positions: Lie on your side with a pillow between your knees. Lie on your back with a pillow under your knees.  Lifting  When lifting objects, keep your feet at least shoulder width apart and tighten your abdominal muscles. Bend your knees and hips and keep your spine neutral. It is important to lift using the strength of your legs, not your back. Do not lock your knees straight out. Always ask for help to lift heavy or awkward objects.  This information is not intended to replace advice given to you by your health care provider. Make sure you discuss any questions you have with your healthcare provider. Document Revised: 01/26/2019 Document Reviewed: 11/13/2018 Elsevier Patient Education  2022 [employer]  Back Exercises The following exercises strengthen the muscles that  help to support the trunk and back. They also help to keep the lower back flexible. Doing these exercises can help to prevent back pain or lessen existing pain. If you have back pain or discomfort, try doing these exercises 2-3 times each day or as told by your health care provider. As your pain improves, do them once each day, but increase the number of times that you repeat the steps for each exercise (do more repetitions). To prevent the recurrence of back pain, continue to do these exercises once each day or as told by your health care provider. Do exercises exactly as told by your health care provider and adjust them as directed. It is normal to feel mild stretching, pulling, tightness, or discomfort as you do these exercises, but you should stop right away if youfeel sudden pain or your pain gets worse. Exercises Single knee to chest Repeat these steps 3-5 times for each leg: Lie on your back on a firm bed or the floor with your legs extended. Bring one knee to your chest. Your other leg should stay extended and in contact with the floor. Hold your knee in place by grabbing your knee or thigh with both hands and hold. Pull on your knee until you feel a gentle stretch in your lower back or buttocks. Hold the stretch for 10-30 seconds. Slowly release and straighten your leg. Pelvic tilt Repeat these steps 5-10 times: Lie on your back on a firm bed or the floor with your legs extended. Bend your knees so they are pointing toward the ceiling and your feet are flat on the floor. Tighten your lower abdominal muscles to press your lower back against the floor. This motion will tilt your pelvis so your tailbone points up toward the ceiling instead of pointing to your feet or the floor. With gentle tension and even breathing, hold this position for 5-10 seconds. Cat-cow Repeat these steps until your lower back becomes more flexible: Get into a hands-and-knees position on a firm surface. Keep your  hands under your shoulders, and keep your knees under your hips. You may place padding under your knees for comfort. Let your head hang down toward your chest. Contract your abdominal muscles and point your tailbone toward the floor so your lower back becomes rounded like the back of a cat. Hold this position for 5 seconds. Slowly lift your head, let your abdominal muscles relax and point your tailbone up toward the ceiling so your back forms a sagging arch like the back of a cow. Hold this position for 5 seconds.  Press-ups Repeat these steps 5-10 times: Lie on your abdomen (face-down) on the floor.  Place your palms near your head, about shoulder-width apart. Keeping your back as relaxed as possible and keeping your hips on the floor, slowly straighten your arms to raise the top half of your body and lift your shoulders. Do not use your back muscles to raise your upper torso. You may adjust the placement of your hands to make yourself more comfortable. Hold this position for 5 seconds while you keep your back relaxed. Slowly return to lying flat on the floor.  Bridges Repeat these steps 10 times: Lie on your back on a firm surface. Bend your knees so they are pointing toward the ceiling and your feet are flat on the floor. Your arms should be flat at your sides, next to your body. Tighten your buttocks muscles and lift your buttocks off the floor until your waist is at almost the same height as your knees. You should feel the muscles working in your buttocks and the back of your thighs. If you do not feel these muscles, slide your feet 1-2 inches farther away from your buttocks. Hold this position for 3-5 seconds. Slowly lower your hips to the starting position, and allow your buttocks muscles to relax completely. If this exercise is too easy, try doing it with your arms crossed over yourchest. Abdominal crunches Repeat these steps 5-10 times: Lie on your back on a firm bed or the floor with  your legs extended. Bend your knees so they are pointing toward the ceiling and your feet are flat on the floor. Cross your arms over your chest. Tip your chin slightly toward your chest without bending your neck. Tighten your abdominal muscles and slowly raise your trunk (torso) high enough to lift your shoulder blades a tiny bit off the floor. Avoid raising your torso higher than that because it can put too much stress on your low back and does not help to strengthen your abdominal muscles. Slowly return to your starting position. Back lifts Repeat these steps 5-10 times: Lie on your abdomen (face-down) with your arms at your sides, and rest your forehead on the floor. Tighten the muscles in your legs and your buttocks. Slowly lift your chest off the floor while you keep your hips pressed to the floor. Keep the back of your head in line with the curve in your back. Your eyes should be looking at the floor. Hold this position for 3-5 seconds. Slowly return to your starting position. Contact a health care provider if: Your back pain or discomfort gets much worse when you do an exercise. Your worsening back pain or discomfort does not lessen within 2 hours after you exercise. If you have any of these problems, stop doing these exercises right away. Do not do them again unless your health care provider says that you can. Get help right away if: You develop sudden, severe back pain. If this happens, stop doing the exercises right away. Do not do them again unless your health care provider says that you can. This information is not intended to replace advice given to you by your health care provider. Make sure you discuss any questions you have with your healthcare provider. Document Revised: 02/08/2019 Document Reviewed: 07/06/2018 Elsevier Patient Education  2022 Elsevier Inc.  Flank Pain, Adult Flank pain is pain that is located on the side of the body between the upper abdomen and the back.  This area is called the flank. The pain may occur over a short period of time (acute), or it may be  long-term or recurring (chronic). It may be mild or severe. Flank pain can be caused by many things, including: Muscle soreness or injury. Kidney stones or kidney disease. Stress. A disease of the spine (vertebral disk disease). A lung infection (pneumonia). Fluid around the lungs (pulmonary edema). A skin rash caused by the chickenpox virus (shingles). Tumors that affect the back of the abdomen. Gallbladder disease. Follow these instructions at home:  Drink enough fluid to keep your urine clear or pale yellow. Rest as told by your health care provider. Take over-the-counter and prescription medicines only as told by your health care provider. Keep a journal to track what has caused your flank pain and what has made it feel better. Keep all follow-up visits as told by your health care provider. This is important. Contact a health care provider if: Your pain is not controlled with medicine. You have new symptoms. Your pain gets worse. You have a fever. Your symptoms last longer than 2-3 days. You have trouble urinating or you are urinating very frequently. Get help right away if: You have trouble breathing or you are short of breath. Your abdomen hurts or it is swollen or red. You have nausea or vomiting. You feel faint or you pass out. You have blood in your urine. Summary Flank pain is pain that is located on the side of the body between the upper abdomen and the back. The pain may occur over a short period of time (acute), or it may be long-term or recurring (chronic). It may be mild or severe. Flank pain can be caused by many things. Contact your health care provider if your symptoms get worse or they last longer than 2-3 days. This information is not intended to replace advice given to you by your health care provider. Make sure you discuss any questions you have with your  healthcare provider. Document Revised: 06/24/2020 Document Reviewed: 06/27/2020 Elsevier Patient Education  2022 [employer].

## 2021-04-14 NOTE — Progress Notes (Signed)
Chief Complaint  Patient presents with   Back Pain   F/u  1. 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 Tried ice and otc meds but does not like taking meds for pain  2. Wife pregnant and he is considering vasectomy [facility] Dr. Lovena Neighbours 05/2021    Review of Systems  Constitutional:  Negative for weight loss.  HENT:  Negative for hearing loss.   Eyes:  Negative for blurred vision.  Respiratory:  Negative for shortness of breath.   Cardiovascular:  Negative for chest pain.  Gastrointestinal:  Positive for abdominal pain and constipation.  Musculoskeletal:  Positive for back pain.  Skin:  Negative for rash.  Neurological:  Negative for headaches.  Psychiatric/Behavioral:  Negative for depression. The patient is nervous/anxious.   Past Medical History:  Diagnosis Date   Anxiety    Colon polyp    Benign, colonoscopy 2015   COVID-19    06/24/20   GERD (gastroesophageal reflux disease)    Globus sensation    History of chicken pox    IBS (irritable bowel syndrome)    constipation   Lymph node enlargement    Wears contact lenses    Past Surgical History:  Procedure Laterality Date   COLONOSCOPY WITH ESOPHAGOGASTRODUODENOSCOPY (EGD)     ESOPHAGOGASTRODUODENOSCOPY (EGD) WITH PROPOFOL N/A 10/21/2017   Procedure: ESOPHAGOGASTRODUODENOSCOPY (EGD) WITH PROPOFOL;  Surgeon: Lucilla Lame, MD;  Location: [place];  Service: Endoscopy;  Laterality: N/A;   FINGER SURGERY     fracture   FLEXIBLE BRONCHOSCOPY N/A 05/29/2018   Procedure: FLEXIBLE BRONCHOSCOPY;  Surgeon: Laverle Hobby, MD;  Location: [facility];  Service: Pulmonary;  Laterality: N/A;   TONSILLECTOMY     WISDOM TOOTH EXTRACTION     Family  History  Problem Relation Age of Onset   Thyroid disease Mother        Living   Goiter Mother    Cirrhosis Father 47       Deceased   Alcohol abuse Father        liver cirrhosis    Cancer Paternal Grandfather        Lung Cancer   Cancer Maternal Grandfather    Lymphoma Sister        Neck   Melanoma Sister    Skin cancer Sister    Social History   Socioeconomic History   Marital status: Married    Spouse name: Not on file   Number of children: Not on file   Years of education: Not on file   Highest education level: Not on file  Occupational History   Occupation: Recruitment consultant  Tobacco Use   Smoking status: Never   Smokeless tobacco: Never  Vaping Use   Vaping Use: Never used  Substance and Sexual Activity   Alcohol use: Yes    Alcohol/week: 0.0 standard drinks    Comment: rare   Drug use: No   Sexual activity: Yes    Partners: Female  Other Topics Concern   Not on file  Social History Narrative   Married wife is Paramedic   1 son 5y.o as of 04/14/21 and 1 son on the way due 06/2021  Works Environmental manager    Social Determinants of Radio broadcast assistant Strain: Not on [employer] Insecurity: Not on file  Transportation Needs: Not on file  Physical Activity: Not on file  Stress: Not on file  Social Connections: Not on file  Intimate Partner Violence: Not on file   Current Meds  Medication Sig   cyclobenzaprine (FLEXERIL) 10 MG tablet Take 1 tablet (10 mg total) by mouth at bedtime as needed for muscle spasms.   No Known Allergies No results found for this or any previous visit (from the past 2160 hour(s)). Objective  Body mass index is 24.85 kg/m. Wt Readings from Last 3 Encounters:  04/14/21 198 lb 12.8 oz (90.2 kg)  12/01/20 194 lb 3.2 oz (88.1 kg)  07/31/20 199 lb 3.2 oz (90.4 kg)   Temp Readings from Last 3 Encounters:  04/14/21 97.7 F (36.5 C) (Oral)  12/01/20 98.7 F (37.1 C) (Temporal)   07/31/20 98.1 F (36.7 C) (Oral)   BP Readings from Last 3 Encounters:  04/14/21 126/80  12/01/20 117/67  07/31/20 126/70   Pulse Readings from Last 3 Encounters:  04/14/21 79  12/01/20 80  07/31/20 81    Physical Exam Vitals and nursing note reviewed.  Constitutional:      Appearance: Normal appearance. He is well-developed and well-groomed.  HENT:     Head: Normocephalic and atraumatic.  Eyes:     Conjunctiva/sclera: Conjunctivae normal.     Pupils: Pupils are equal, round, and reactive to light.  Cardiovascular:     Rate and Rhythm: Normal rate and regular rhythm.     Heart sounds: Normal heart sounds. No murmur heard. Pulmonary:     Effort: Pulmonary effort is normal.     Breath sounds: Normal breath sounds.  Abdominal:     General: Abdomen is flat. Bowel sounds are normal.     Tenderness: There is abdominal tenderness in the periumbilical area, suprapubic area, left upper quadrant and left lower quadrant. There is left CVA tenderness.     Comments: Mild ttp  Skin:    General: Skin is warm and moist.  Neurological:     General: No focal deficit present.     Mental Status: He is alert and oriented to person, place, and time. Mental status is at baseline.     Gait: Gait normal.  Psychiatric:        Attention and Perception: Attention and perception normal.        Mood and Affect: Mood and affect normal.        Speech: Speech normal.        Behavior: Behavior normal. Behavior is cooperative.        Thought Content: Thought content normal.        Cognition and Memory: Cognition and memory normal.        Judgment: Judgment normal.   Assessment  Plan  Left flank pain r/o constipation/stone vs MSK - Plan: Urinalysis, Routine w reflex microscopic, DG Abd 1 View ROI Dr. Earlean Shawl colonoscopy in 2018  ROI [facility]   Irritable bowel syndrome with constipation - Plan: DG Abd 1 View  Acute left-sided low back pain without sciatica with mid back scoliosis - Plan:  cyclobenzaprine (FLEXERIL) 10 MG tablet qhs prn  Stretches  Doing ice Tylenol prn NSAIDS  Heat/ice Consider PT in the future   HM Flu utd Tdap had 07/18/12  [employer] 2/2 disc booster h/o covid 19 + Never had HPV  shot  Hep B immune   MMR rec given Rx prev    Colonoscopy due age 63 Dr Earlean Shawl sent ROI Saw derm 2-3 years ago from 2018/2019 mom h/o BCC shoulder  Saw in 05/2020 VV left hand no tx tbse Dr. Jarome Matin [place] derm PSA due age 60 baseline est [facility] 05/2021 for vasectomy consult   Provider: Dr. Olivia Mackie [facility]

## 2021-04-15 LAB — URINALYSIS, ROUTINE W REFLEX MICROSCOPIC
Bilirubin Urine: NEGATIVE
Glucose, UA: NEGATIVE
Hgb urine dipstick: NEGATIVE
Ketones, ur: NEGATIVE
Leukocytes,Ua: NEGATIVE
Nitrite: NEGATIVE
Protein, ur: NEGATIVE
Specific Gravity, Urine: 1.02 (ref 1.001–1.035)
pH: 6 (ref 5.0–8.0)

## 2021-04-16 NOTE — Telephone Encounter (Signed)
Yes I did see the patient for GERD in the past and not for constipation or side pain. Please have the patient come in for evaluation for these problems. Thanks.

## 2021-04-21 ENCOUNTER — Encounter: Payer: Self-pay | Admitting: Internal Medicine

## 2021-04-21 NOTE — Telephone Encounter (Signed)
Please advise, Patient last seen in office 04/15/21 for unrelated issue.

## 2021-05-04 ENCOUNTER — Encounter: Payer: Self-pay | Admitting: Internal Medicine

## 2021-05-04 NOTE — Progress Notes (Signed)
Release of information for colonoscopy faxed to Dr [facility]. Confirmation received and ROI sent to scan .

## 2021-05-04 NOTE — Progress Notes (Signed)
Release of information faxed to [facility] for patient's x-rays. Confirmation received and ROI sent to scan.

## 2021-05-15 ENCOUNTER — Encounter: Payer: Self-pay | Admitting: Internal Medicine

## 2021-05-28 ENCOUNTER — Encounter: Payer: Self-pay | Admitting: Internal Medicine

## 2021-06-03 ENCOUNTER — Encounter: Payer: Self-pay | Admitting: Orthopaedic Surgery

## 2021-06-03 ENCOUNTER — Other Ambulatory Visit: Payer: Self-pay

## 2021-06-03 ENCOUNTER — Ambulatory Visit: Payer: No Typology Code available for payment source | Admitting: Orthopaedic Surgery

## 2021-06-03 ENCOUNTER — Ambulatory Visit (INDEPENDENT_AMBULATORY_CARE_PROVIDER_SITE_OTHER): Payer: No Typology Code available for payment source

## 2021-06-03 DIAGNOSIS — M545 Low back pain, unspecified: Secondary | ICD-10-CM | POA: Diagnosis not present

## 2021-06-03 MED ORDER — METHOCARBAMOL 500 MG PO TABS: 500.0000 mg | ORAL_TABLET | Freq: Four times a day (QID) | ORAL | 1 refills | Status: DC | PRN

## 2021-06-03 MED ORDER — METHYLPREDNISOLONE 4 MG PO TABS: ORAL_TABLET | ORAL | 0 refills | Status: DC

## 2021-06-03 NOTE — Progress Notes (Signed)
Office Visit Note   Patient: Adam Bishop           Date of Birth: 1986/02/22           MRN: [id number] Visit Date: 06/03/2021              Requested by: McLean-Scocuzza, Nino Glow, MD [place],   [id number] PCP: McLean-Scocuzza, Nino Glow, MD   Assessment & Plan: Visit Diagnoses:  1. Acute left-sided low back pain without sciatica     Plan: I would like to send him to outpatient physical therapy for various modalities that can help decrease the muscle pain on the left side of his low back.  This can include iontophoresis and phonophoresis as well as e-stim and dry needling.  I will send in a 6-day steroid taper and try Robaxin instead as a muscle relaxant which will hopefully not tiring him out.  All questions and concerns were answered addressed.  We will see him back in 4 weeks to see how he has responded to therapy.  Follow-Up Instructions: Return in about 4 weeks (around 07/01/2021).   Orders:  Orders Placed This Encounter  Procedures   XR Lumbar Spine 2-3 Views   Meds ordered this encounter  Medications   methylPREDNISolone (MEDROL) 4 MG tablet    Sig: Medrol dose pack. Take as instructed    Dispense:  21 tablet    Refill:  0   methocarbamol (ROBAXIN) 500 MG tablet    Sig: Take 1 tablet (500 mg total) by mouth every 6 (six) hours as needed.    Dispense:  40 tablet    Refill:  1      Procedures: No procedures performed   Clinical Data: No additional findings.   Subjective: Chief Complaint  Patient presents with   Lower Back - Pain  The patient comes in today with left-sided low back pain this more in the paraspinal muscles between his ribs on the left side and lower back.  There is mild sciatic component of his pain but most of this is with twisting and bending activities to that side of his back.  They originally felt that it may be a kidney issue but that was ruled out.  He works at a [employer] and does a lot of heavy manual labor with a lot  of twisting.  He is tried Advil as needed and he takes Flexeril at night on occasion but it makes him too tired feeling.  He denies any change in bowel bladder function or weakness in his legs.  I have actually seen him before for left-sided sciatica in 2020.  He is not a diabetic and is a thin individual.  HPI  Review of Systems There is currently listed no headache, chest pain, shortness of breath, fever, chills, nausea, vomiting  Objective: Vital Signs: There were no vitals taken for this visit.  Physical Exam He is alert and orient x3 and in no acute distress Ortho Exam Examination of his low back shows a straight appearing spine.  He has excellent flexion extension of his lumbar spine.  He has pain with lateral rotation and lateral bending to the left side with a mild sciatic component but really his pain seems to be in the paraspinal muscles and really just below the ribs and the oblique muscles to the left side.  He has 5 out of 5 strength in his bilateral lower extremities and normal sensation. Specialty Comments:  No specialty comments available.  Imaging: XR Lumbar Spine 2-3 Views  Result Date: 06/03/2021 2 views lumbar spine show no acute findings.  There is no malalignment.    PMFS History: Patient Active Problem List   Diagnosis Date Noted   Irritable bowel syndrome with constipation 04/14/2021   Acute left-sided low back pain without sciatica 04/14/2021   Scoliosis 04/14/2021   TMJ (temporomandibular joint syndrome) 11/07/2019   Referred otalgia of right ear 09/05/2019   Sciatica, left side 04/09/2019   Lymphadenopathy of head and neck 12/20/2018   Strain of right trapezius muscle 12/20/2018   Cervicalgia 12/20/2018   Onychomycosis 12/05/2018   History of asthma 08/16/2018   Hyperlipidemia 08/16/2018   Acute pain of right shoulder 08/07/2018   Impingement syndrome of shoulder, right 08/07/2018   Thyroid nodule 11/25/2017   Globus sensation 11/25/2017   Other  somatoform disorders    Chronic LLQ pain 09/19/2015   Annual physical exam 06/07/2015   GERD (gastroesophageal reflux disease) 11/07/2014   Chest wall pain 08/28/2014   Generalized anxiety disorder 08/28/2014   Past Medical History:  Diagnosis Date   Anxiety    Colon polyp    Benign, colonoscopy 2015   COVID-19    06/24/20, 04/2021   GERD (gastroesophageal reflux disease)    Globus sensation    History of chicken pox    IBS (irritable bowel syndrome)    constipation   Lymph node enlargement    Wears contact lenses     Family History  Problem Relation Age of Onset   Thyroid disease Mother        Living   Goiter Mother    Cirrhosis Father 62       Deceased   Alcohol abuse Father        liver cirrhosis    Cancer Paternal Grandfather        Lung Cancer   Cancer Maternal Grandfather    Lymphoma Sister        Neck   Melanoma Sister    Skin cancer Sister     Past Surgical History:  Procedure Laterality Date   COLONOSCOPY WITH ESOPHAGOGASTRODUODENOSCOPY (EGD)     ESOPHAGOGASTRODUODENOSCOPY (EGD) WITH PROPOFOL N/A 10/21/2017   Procedure: ESOPHAGOGASTRODUODENOSCOPY (EGD) WITH PROPOFOL;  Surgeon: Lucilla Lame, MD;  Location: [facility];  Service: Endoscopy;  Laterality: N/A;   FINGER SURGERY     fracture   FLEXIBLE BRONCHOSCOPY N/A 05/29/2018   Procedure: FLEXIBLE BRONCHOSCOPY;  Surgeon: Laverle Hobby, MD;  Location: [facility];  Service: Pulmonary;  Laterality: N/A;   TONSILLECTOMY     WISDOM TOOTH EXTRACTION     Social History   Occupational History   Occupation: Recruitment consultant  Tobacco Use   Smoking status: Never   Smokeless tobacco: Never  Vaping Use   Vaping Use: Never used  Substance and Sexual Activity   Alcohol use: Yes    Alcohol/week: 0.0 standard drinks    Comment: rare   Drug use: No   Sexual activity: Yes    Partners: Female

## 2021-06-20 ENCOUNTER — Encounter: Payer: Self-pay | Admitting: Orthopaedic Surgery

## 2021-06-20 ENCOUNTER — Encounter: Payer: Self-pay | Admitting: Internal Medicine

## 2021-06-22 ENCOUNTER — Encounter: Payer: Self-pay | Admitting: Orthopaedic Surgery

## 2021-06-23 ENCOUNTER — Other Ambulatory Visit: Payer: Self-pay

## 2021-06-23 DIAGNOSIS — M4807 Spinal stenosis, lumbosacral region: Secondary | ICD-10-CM

## 2021-06-23 NOTE — Telephone Encounter (Signed)
If he has seen ortho and tried medication and PT is worsening symptoms, then I would agree with ortho - further testing seems warranted.

## 2021-06-24 NOTE — Telephone Encounter (Signed)
Called to speak with Adam Bishop and gave the provider message. He states that both his chiropractic doctor and PT state that they believe his pain to be a muscular skeletal issue and are not sure if it is a pinched nerve or if something is out of alignment. Pt states that he is agreeable to the MRI and the orders were placed on 06/23/21. Pt states that he has contacted Ortho and was given prednisone to help with his symptoms. Pt states that he is currently waiting to hear back from the referral team. Pt had no other concerns or questions at this time.

## 2021-06-25 ENCOUNTER — Encounter: Payer: Self-pay | Admitting: Orthopaedic Surgery

## 2021-06-30 ENCOUNTER — Ambulatory Visit
Admission: RE | Admit: 2021-06-30 | Discharge: 2021-06-30 | Disposition: A | Discharge status: Home / Self Care | Payer: No Typology Code available for payment source | Source: Ambulatory Visit | Attending: Orthopaedic Surgery | Admitting: Orthopaedic Surgery

## 2021-06-30 DIAGNOSIS — M4807 Spinal stenosis, lumbosacral region: Secondary | ICD-10-CM

## 2021-07-01 ENCOUNTER — Ambulatory Visit: Payer: No Typology Code available for payment source | Admitting: Orthopaedic Surgery

## 2021-07-01 ENCOUNTER — Encounter: Payer: Self-pay | Admitting: Orthopaedic Surgery

## 2021-07-03 ENCOUNTER — Other Ambulatory Visit: Payer: Self-pay

## 2021-07-03 DIAGNOSIS — M545 Low back pain, unspecified: Secondary | ICD-10-CM

## 2021-07-03 NOTE — Telephone Encounter (Signed)
Referral placed in chart  

## 2021-07-05 ENCOUNTER — Other Ambulatory Visit: Payer: No Typology Code available for payment source

## 2021-07-17 ENCOUNTER — Encounter: Payer: Self-pay | Admitting: Internal Medicine

## 2021-07-17 NOTE — Telephone Encounter (Signed)
Please advise 

## 2021-07-20 ENCOUNTER — Ambulatory Visit: Payer: No Typology Code available for payment source | Admitting: Orthopaedic Surgery

## 2021-07-20 NOTE — Telephone Encounter (Signed)
Patient is having left sided rib pain rated at 5 since Feb, and pain rated at 5 continuous , sometimes Ice helps but not for long , and pain worsen with pressure or touch the pain is located at the lower rib midway between spine and back , the lower back pain he seeing orthop for is not the same this much higher up. I have scheduled with MD in office.

## 2021-07-23 ENCOUNTER — Encounter: Payer: Self-pay | Admitting: Internal Medicine

## 2021-07-23 ENCOUNTER — Other Ambulatory Visit: Payer: Self-pay

## 2021-07-23 ENCOUNTER — Ambulatory Visit (INDEPENDENT_AMBULATORY_CARE_PROVIDER_SITE_OTHER): Payer: No Typology Code available for payment source

## 2021-07-23 ENCOUNTER — Ambulatory Visit: Payer: No Typology Code available for payment source | Admitting: Internal Medicine

## 2021-07-23 VITALS — BP 138/82 | HR 87 | Temp 96.7°F | Ht 75.0 in | Wt 203.0 lb

## 2021-07-23 DIAGNOSIS — R0789 Other chest pain: Secondary | ICD-10-CM

## 2021-07-23 DIAGNOSIS — E041 Nontoxic single thyroid nodule: Secondary | ICD-10-CM

## 2021-07-23 DIAGNOSIS — Z8709 Personal history of other diseases of the respiratory system: Secondary | ICD-10-CM

## 2021-07-23 DIAGNOSIS — M4130 Thoracogenic scoliosis, site unspecified: Secondary | ICD-10-CM

## 2021-07-23 DIAGNOSIS — K5909 Other constipation: Secondary | ICD-10-CM

## 2021-07-23 NOTE — Assessment & Plan Note (Signed)
Convex to the right apex t7 per thoracic MRI 2022.  Since adolescence

## 2021-07-23 NOTE — Progress Notes (Signed)
Subjective:  Patient ID: Adam Bishop, male    DOB: November 12, 1985  Age: 35 y.o. MRN: [id number]  CC: The primary encounter diagnosis was Left-sided chest wall pain. Diagnoses of Thyroid nodule, History of asthma, Thoracogenic scoliosis, unspecified spinal region, Chest wall pain, and Chronic constipation were also pertinent to this visit.  HPI Adam Bishop presents for  Chief Complaint  Patient presents with   Follow-up    Left side rib pain since February.    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  He was told it was musculoskeletal and it improved with a short prednisone taper that was prescribed by Magnus Ivan during his evaluation in August but the pain has not resolved.    Patient remains concerned that the pain is due to an undiagnosed kidney problem , as previous abd  films ordered by [facility]  in June 2022 to evaluate left flank pain noted a tiny calcification in the right kidney.  Urinalysis has been normal and left-sided low back pain  has not migrated.  He describes it ias starting in the  paraspinal muscles between his ribs on the left side and lower back.  it is aggravated by twisting and bending activities to that side of his back. It is not accompanied by dyepnea. He works at a [employer] and does a lot of heavy manual labor with a lot of twisting.    Previous x rays done last year by [facility]. Reviewed.       Outpatient Medications Prior to Visit  Medication Sig Dispense Refill   methocarbamol (ROBAXIN) 500 MG tablet Take 1 tablet (500 mg total) by mouth every 6 (six) hours as needed. 40 tablet 1   cyclobenzaprine (FLEXERIL) 10 MG tablet Take 1 tablet (10 mg total) by mouth at bedtime as needed for muscle spasms. (Patient not taking: Reported on 07/23/2021) 30 tablet 5   methylPREDNISolone (MEDROL) 4 MG tablet Medrol dose pack. Take as instructed  (Patient not taking: Reported on 07/23/2021) 21 tablet 0   No facility-administered medications prior to visit.    Review of Systems;  Patient denies headache, fevers, malaise, unintentional weight loss, skin rash, eye pain, sinus congestion and sinus pain, sore throat, dysphagia,  hemoptysis , cough, dyspnea, wheezing, chest pain, palpitations, orthopnea, edema, abdominal pain, nausea, melena, diarrhea, constipation, flank pain, dysuria, hematuria, urinary  Frequency, nocturia, numbness, tingling, seizures,  Focal weakness, Loss of consciousness,  Tremor, insomnia, depression, anxiety, and suicidal ideation.      Objective:  BP 138/82 (BP Location: Left Arm, Patient Position: Sitting, Cuff Size: Normal)   Pulse 87   Temp (!) 96.7 F (35.9 C) (Temporal)   Ht 6\' 3"  (1.905 m)   Wt 203 lb (92.1 kg)   SpO2 98%   BMI 25.37 kg/m   BP Readings from Last 3 Encounters:  07/23/21 138/82  04/14/21 126/80  12/01/20 117/67    Wt Readings from Last 3 Encounters:  07/23/21 203 lb (92.1 kg)  04/14/21 198 lb 12.8 oz (90.2 kg)  12/01/20 194 lb 3.2 oz (88.1 kg)    General appearance: alert, cooperative and appears stated age Ears: normal TM's and external ear canals both ears Throat: lips, mucosa, and tongue normal; teeth and gums normal Neck: no adenopathy, no carotid bruit, supple, symmetrical, trachea midline and thyroid not enlarged, symmetric, no tenderness/mass/nodules Chest  wall:  non tender,   mild gynecomastia bilaterally.  Back: symmetric, mild scoliosis to the right.   ROM normal. No CVA or rib tenderness. Lungs: clear to auscultation bilaterally Heart: regular rate and rhythm, S1, S2 normal, no murmur, click, rub or gallop Abdomen: soft, non-tender; bowel sounds normal; no masses,  no organomegaly Pulses: 2+ and symmetric Skin: Skin color, texture, turgor normal. No rashes or lesions Lymph nodes: Cervical, supraclavicular, and axillary nodes normal.  No results found for:  HGBA1C  Lab Results  Component Value Date   CREATININE 0.85 12/12/2020   CREATININE 0.78 01/15/2020   CREATININE 0.80 12/13/2018    Lab Results  Component Value Date   WBC 5.3 12/12/2020   HGB 14.7 12/12/2020   HCT 43.4 12/12/2020   PLT 222.0 12/12/2020   GLUCOSE 76 12/12/2020   CHOL 186 12/12/2020   TRIG 48.0 12/12/2020   HDL 42.80 12/12/2020   LDLCALC 134 (H) 12/12/2020   ALT 36 12/12/2020   AST 25 12/12/2020   NA 139 12/12/2020   K 4.7 12/12/2020   CL 101 12/12/2020   CREATININE 0.85 12/12/2020   BUN 14 12/12/2020   CO2 32 12/12/2020   TSH 0.96 12/12/2020    MR Thoracic Spine w/o contrast  Result Date: 06/30/2021 CLINICAL DATA:  Midthoracic back pain. Compression fracture suspected. Rule out nerve compression. EXAM: MRI THORACIC SPINE WITHOUT CONTRAST TECHNIQUE: Multiplanar, multisequence MR imaging of the thoracic spine was performed. No intravenous contrast was administered. COMPARISON:  None. FINDINGS: Alignment: Mild scoliotic curvature convex to the right with the apex at T7. Mild kyphotic curvature in the lower thoracic region. Vertebrae: No evidence of regional compression fracture either old or recent. No focal bone lesion. Cord:  No cord compression or focal cord lesion. Paraspinal and other soft tissues: Negative Disc levels: No evidence of disc bulge or herniation. No stenosis of the canal or foramina. Disc heights are somewhat smaller than normal which could be developmental or reflect an element of distant Scheuermann's disease. IMPRESSION: Mild scoliotic curvature convex to the right. No evidence of regional fracture. No stenosis of the canal or foramina. Somewhat diminutive discs, likely developmental or reflecting an element of distant Scheuermann's disease. Electronically Signed   By: Paulina Fusi M.D.   On: 06/30/2021 15:43   MR Lumbar Spine w/o contrast  Result Date: 06/30/2021 CLINICAL DATA:  Thoracic and lumbar region back pain. Spinal stenosis of lumbar  region. EXAM: MRI LUMBAR SPINE WITHOUT CONTRAST TECHNIQUE: Multiplanar, multisequence MR imaging of the lumbar spine was performed. No intravenous contrast was administered. COMPARISON:  Thoracic study same day. Lumbar radiography 06/03/2021. FINDINGS: Segmentation:  5 lumbar type vertebral bodies. Alignment:  Normal Vertebrae:  No fracture or focal bone lesion. Conus medullaris and cauda equina: Conus extends to the L1-2 level. Conus and cauda equina appear normal. Paraspinal and other soft tissues: Negative Disc levels: No disc level abnormality at L4-5 or above. Wide patency of the canal and foramina. At L5-S1, the disc show degenerative desiccation with a shallow disc herniation with some caudal migration behind S1. This does not have any mass-effect upon the thecal sac but approaches the proximal S1 root sleeves. S1 nerve compression is not demonstrated, but nerve irritation could occur. The findings could certainly relate to low back pain. IMPRESSION: Degenerative disc disease at L5-S1 with a shallow disc herniation showing some caudal migration behind S1. This does not appear to cause neural compression, but could relate to low back pain or neural irritation. Electronically Signed  By: Paulina Fusi M.D.   On: 06/30/2021 15:46    Assessment & Plan:   Problem List Items Addressed This Visit       Unprioritized   Chest wall pain    Left sided.  2 view chest and unilateral Rib films were done today to rule out fracture,  Mass,  Etc and were normal.  Given his dextoconvex sciolosis,, agree with Dr Rayburn Ma that his pain is MSK in origin and aggravated by his physically demanding job.  Will recommend PT for core strengthening        Chronic constipation    Suggested by prior abd films confirmed with history  Recommend use of stool softeners, magneisum,  BFLs.      History of asthma   Scoliosis    Convex to the right apex t7 per thoracic MRI 2022.  Since adolescence       Thyroid nodule    No  findings on thyroid ultrasound done in 2019 as follow up       Other Visit Diagnoses     Left-sided chest wall pain    -  Primary   Relevant Orders   DG Chest 2 View (Completed)   DG Ribs Unilateral Left (Completed)       I spent 30 minutes dedicated to the care of this patient on the date of this encounter to include pre-visit review of his medical history,  Face-to-face time with the patient , and post visit ordering of testing and therapeutics.   Medications Discontinued During This Encounter  Medication Reason   cyclobenzaprine (FLEXERIL) 10 MG tablet    methylPREDNISolone (MEDROL) 4 MG tablet     Follow-up: No follow-ups on file.   Sherlene Shams, MD

## 2021-07-23 NOTE — Patient Instructions (Addendum)
For the constipation:  Add magnesium citrate 250 mg capsule daily for constipation  Goal is 60 ounces of water daily    For the rib pain   Plain films of ribs and chest wall today (because they have not been done yet)  If ribs appear normal  we will try a prednisone taper and a low dose of gabapentin

## 2021-07-23 NOTE — Assessment & Plan Note (Signed)
No findings on thyroid ultrasound done in 2019 as follow up

## 2021-07-25 DIAGNOSIS — K5909 Other constipation: Secondary | ICD-10-CM | POA: Insufficient documentation

## 2021-07-25 NOTE — Assessment & Plan Note (Signed)
Suggested by prior abd films confirmed with history  Recommend use of stool softeners, magneisum,  BFLs.

## 2021-07-25 NOTE — Assessment & Plan Note (Addendum)
Left sided.  2 view chest and unilateral Rib films were done today to rule out fracture,  Mass,  Etc and were normal.  Given his dextoconvex sciolosis,, agree with Dr Rayburn Ma that his pain is MSK in origin and aggravated by his physically demanding job.  Will recommend PT for core strengthening

## 2021-07-29 ENCOUNTER — Other Ambulatory Visit: Payer: Self-pay | Admitting: Internal Medicine

## 2021-07-29 MED ORDER — PREDNISONE 10 MG PO TABS: ORAL_TABLET | ORAL | 0 refills | Status: DC

## 2021-07-29 MED ORDER — GABAPENTIN 100 MG PO CAPS: 100.0000 mg | ORAL_CAPSULE | Freq: Three times a day (TID) | ORAL | 3 refills | Status: DC

## 2021-07-31 ENCOUNTER — Encounter: Payer: PRIVATE HEALTH INSURANCE | Admitting: Internal Medicine

## 2021-08-03 ENCOUNTER — Ambulatory Visit: Payer: No Typology Code available for payment source | Admitting: Physical Medicine and Rehabilitation

## 2021-08-03 ENCOUNTER — Telehealth: Payer: Self-pay | Admitting: Physical Medicine and Rehabilitation

## 2021-08-03 ENCOUNTER — Encounter: Payer: Self-pay | Admitting: Orthopaedic Surgery

## 2021-08-03 NOTE — Telephone Encounter (Signed)
Called patient to reschedule and he states that this has already been done.

## 2021-08-03 NOTE — Telephone Encounter (Signed)
Pt called requesting to reschedule appt. Please call pt at [phone].

## 2021-08-05 ENCOUNTER — Other Ambulatory Visit: Payer: Self-pay

## 2021-08-05 ENCOUNTER — Encounter: Payer: Self-pay | Admitting: Physical Medicine and Rehabilitation

## 2021-08-05 ENCOUNTER — Ambulatory Visit: Payer: No Typology Code available for payment source | Admitting: Physical Medicine and Rehabilitation

## 2021-08-05 VITALS — BP 151/96 | HR 79

## 2021-08-05 DIAGNOSIS — G8929 Other chronic pain: Secondary | ICD-10-CM

## 2021-08-05 DIAGNOSIS — M7918 Myalgia, other site: Secondary | ICD-10-CM

## 2021-08-05 DIAGNOSIS — M5126 Other intervertebral disc displacement, lumbar region: Secondary | ICD-10-CM

## 2021-08-05 DIAGNOSIS — M5116 Intervertebral disc disorders with radiculopathy, lumbar region: Secondary | ICD-10-CM | POA: Diagnosis not present

## 2021-08-05 DIAGNOSIS — M546 Pain in thoracic spine: Secondary | ICD-10-CM

## 2021-08-05 NOTE — Patient Instructions (Signed)

## 2021-08-05 NOTE — Progress Notes (Signed)
Pt state lower back pain. Pt state he doesn't know what really causes the pain. Pt state he takes pain meds to help ease his pain.  Numeric Pain Rating Scale and Functional Assessment Average Pain 6 Pain Right Now 8 My pain is constant, sharp, dull, and aching Pain is worse with: some activites Pain improves with: medication   In the last MONTH (on 0-10 scale) has pain interfered with the following?  1. General activity like being  able to carry out your everyday physical activities such as walking, climbing stairs, carrying groceries, or moving a chair?  Rating(5)  2. Relation with others like being able to carry out your usual social activities and roles such as  activities at home, at work and in your community. Rating(6)  3. Enjoyment of life such that you have  been bothered by emotional problems such as feeling anxious, depressed or irritable?  Rating(7)

## 2021-08-05 NOTE — Progress Notes (Signed)
Adam Bishop - 35 y.o. male MRN [id number]  Date of birth: 12-05-1985  Office Visit Note: Visit Date: 08/05/2021 PCP: McLean-Scocuzza, Nino Glow, MD Referred by: Orland Mustard *  Subjective: Chief Complaint  Patient presents with   Lower Back - Pain   HPI: SAHITH Bishop is a 35 y.o. male who comes in today per the request of Dr. Jean Rosenthal for evaluation of left sided lower back pain radiating to leg and left sided thoracic back pain. Patient was recently placed on oral Prednisone by his primary care Dr. Deborra Medina and currently states much improvement of left lower back pain and associated radicular issues with current treatment regimen. Patient states his left back and leg discomfort has been intermittent for several years and is exacerbated by movement/activity. Recent lumbar MRI exhibits shallow disc herniation at L5-S1 with possible S1 nerve irritation.   Patient reports left sided thoracic back pain has been chronic for 6 or more months. Patient reports pain is exacerbated by movement and activity, describes as sharp in nature, currently rates as 5 out of 10. Patient reports some pain relief with home exercise program, rest and medications. Patient's recent thoracic MRI exhibits mild scoliotic curvature convex to the right, no stenosis of the canal or foramina.   Patient states he is a very active person. 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 Patient reports he is concerned this could be kidney issue, however recent negative labs, urinalysis, and negative abdominal x-ray by PCP in June. Patient is currently attending formal chiropractor treatment at [facility] and reports some pain relief with this therapy. Patient denies focal weakness, numbness and tingling. Patient denies recent trauma or falls.   Review of Systems  Musculoskeletal:  Positive for back pain and myalgias.  Neurological:   Negative for tingling, sensory change, focal weakness and weakness.  All other systems reviewed and are negative. Otherwise per HPI.  Assessment & Plan: Visit Diagnoses:    ICD-10-CM   1. Radiculopathy due to lumbar intervertebral disc disorder  M51.16     2. Other intervertebral disc displacement, lumbar region  M51.26     3. Chronic left-sided thoracic back pain  M54.6    G89.29     4. Myofascial pain syndrome  M79.18        Plan: Findings:  Chronic left-sided lower back pain radiating to leg.  Patient reports significant relief of pain with recent oral Prednisone regimen and formal chiropractor treatment.  We did discuss patient's recent lumbar MRI with him today in detail using images and spine model.  Lumbar MRI does exhibit a shallow disc herniation at L5-S1 with possible S1 nerve irritation.  We feel the next step is to continue to monitor patient closely and if his pain returns we would consider performing lumbar epidural steroid injection under fluoroscopic guidance.  Chronic, worsening and severe left-sided thoracic back pain.  Patient continues to have pain despite good conservative therapies such as formal chiropractor treatment and medications.  Patient's clinical presentation and exam is consistent with classic myofascial pain.  We did discuss patient's recent thoracic MRI with him today in detail using images and spine model.  We did discuss possible treatment options today including continued chiropractor treatments.  We also gave patient educational literature regarding Dr. Tressie Ellis McGill's exercises and instructed patient to perform these at home as tolerated.  We also discussed possible physical therapy with a focus on lumbar trigger points/dry  needling.  Patient states that he wishes to continue chiropractor treatments and would like to hold off on a referral to physical therapy at this time due to financial considerations.  Patient informed to let us know if his left lower  back pain does return so that we can get him in quickly for a diagnostic lumbar epidural steroid injection.  No red flag symptoms noted today upon exam.   Meds & Orders: No orders of the defined types were placed in this encounter.  No orders of the defined types were placed in this encounter.   Follow-up: Return if symptoms worsen or fail to improve.   Procedures: No procedures performed      Clinical History: MRI LUMBAR SPINE WITHOUT CONTRAST   TECHNIQUE: Multiplanar, multisequence MR imaging of the lumbar spine was performed. No intravenous contrast was administered.   COMPARISON:  Thoracic study same day. Lumbar radiography 06/03/2021.   FINDINGS: Segmentation:  5 lumbar type vertebral bodies.   Alignment:  Normal   Vertebrae:  No fracture or focal bone lesion.   Conus medullaris and cauda equina: Conus extends to the L1-2 level. Conus and cauda equina appear normal.   Paraspinal and other soft tissues: Negative   Disc levels:   No disc level abnormality at L4-5 or above. Wide patency of the canal and foramina.   At L5-S1, the disc show degenerative desiccation with a shallow disc herniation with some caudal migration behind S1. This does not have any mass-effect upon the thecal sac but approaches the proximal S1 root sleeves. S1 nerve compression is not demonstrated, but nerve irritation could occur. The findings could certainly relate to low back pain.   IMPRESSION: Degenerative disc disease at L5-S1 with a shallow disc herniation showing some caudal migration behind S1. This does not appear to cause neural compression, but could relate to low back pain or neural irritation.     Electronically Signed By: Nelson Chimes M.D. On: 06/30/2021 15:46 --- MRI THORACIC SPINE WITHOUT CONTRAST   TECHNIQUE: Multiplanar, multisequence MR imaging of the thoracic spine was performed. No intravenous contrast was administered.   COMPARISON:  None.    FINDINGS: Alignment: Mild scoliotic curvature convex to the right with the apex at T7. Mild kyphotic curvature in the lower thoracic region.   Vertebrae: No evidence of regional compression fracture either old or recent. No focal bone lesion.   Cord:  No cord compression or focal cord lesion.   Paraspinal and other soft tissues: Negative   Disc levels:   No evidence of disc bulge or herniation. No stenosis of the canal or foramina. Disc heights are somewhat smaller than normal which could be developmental or reflect an element of distant Scheuermann's disease.   IMPRESSION: Mild scoliotic curvature convex to the right. No evidence of regional fracture. No stenosis of the canal or foramina. Somewhat diminutive discs, likely developmental or reflecting an element of distant Scheuermann's disease.     Electronically Signed   By: Nelson Chimes M.D.   On: 06/30/2021 15:43   He reports that he has never smoked. He has never used smokeless tobacco. No results for input(s): HGBA1C, LABURIC in the last 8760 hours.  Objective:  VS:  HT:    WT:   BMI:     BP:(!) 151/96  HR:79bpm  TEMP: ( )  RESP:  Physical Exam Vitals and nursing note reviewed.  HENT:     Head: Normocephalic and atraumatic.     Right Ear: External ear normal.  Left Ear: External ear normal.     Nose: Nose normal.     Mouth/Throat:     Mouth: Mucous membranes are moist.  Eyes:     Extraocular Movements: Extraocular movements intact.  Cardiovascular:     Rate and Rhythm: Normal rate.     Pulses: Normal pulses.  Pulmonary:     Effort: Pulmonary effort is normal.  Abdominal:     General: Abdomen is flat. There is no distension.  Musculoskeletal:        General: Tenderness present.     Cervical back: Normal range of motion.     Comments: Pt rises from seated position to standing without difficulty. Good lumbar range of motion. Strong distal strength without clonus, no pain upon palpation of greater  trochanters. Sensation intact bilaterally. Walks independently, gait steady.   Tenderness noted to left thoracic region upon palpation. Good strength noted to bilateral upper extremities.   Skin:    General: Skin is warm and dry.     Capillary Refill: Capillary refill takes less than 2 seconds.  Neurological:     General: No focal deficit present.     Mental Status: He is alert.  Psychiatric:        Mood and Affect: Mood normal.    Ortho Exam  Imaging: No results found.  Past Medical/Family/Surgical/Social History: Medications & Allergies reviewed per EMR, new medications updated. Patient Active Problem List   Diagnosis Date Noted   Chronic constipation 07/25/2021   Irritable bowel syndrome with constipation 04/14/2021   Acute left-sided low back pain without sciatica 04/14/2021   Scoliosis 04/14/2021   TMJ (temporomandibular joint syndrome) 11/07/2019   Referred otalgia of right ear 09/05/2019   Sciatica, left side 04/09/2019   Lymphadenopathy of head and neck 12/20/2018   Cervicalgia 12/20/2018   Onychomycosis 12/05/2018   History of asthma 08/16/2018   Hyperlipidemia 08/16/2018   Acute pain of right shoulder 08/07/2018   Impingement syndrome of shoulder, right 08/07/2018   Thyroid nodule 11/25/2017   Globus sensation 11/25/2017   Other somatoform disorders    Chronic LLQ pain 09/19/2015   Annual physical exam 06/07/2015   GERD (gastroesophageal reflux disease) 11/07/2014   Chest wall pain 08/28/2014   Generalized anxiety disorder 08/28/2014   Past Medical History:  Diagnosis Date   Anxiety    Colon polyp    Benign, colonoscopy 2015   COVID-19    06/24/20, 04/2021   GERD (gastroesophageal reflux disease)    Globus sensation    History of chicken pox    IBS (irritable bowel syndrome)    constipation   Lymph node enlargement    Wears contact lenses    Family History  Problem Relation Age of Onset   Thyroid disease Mother        Living   Goiter Mother     Cirrhosis Father 65       Deceased   Alcohol abuse Father        liver cirrhosis    Cancer Paternal Grandfather        Lung Cancer   Cancer Maternal Grandfather    Lymphoma Sister        Neck   Melanoma Sister    Skin cancer Sister    Past Surgical History:  Procedure Laterality Date   COLONOSCOPY WITH ESOPHAGOGASTRODUODENOSCOPY (EGD)     ESOPHAGOGASTRODUODENOSCOPY (EGD) WITH PROPOFOL N/A 10/21/2017   Procedure: ESOPHAGOGASTRODUODENOSCOPY (EGD) WITH PROPOFOL;  Surgeon: Lucilla Lame, MD;  Location: [place];  Service:  Endoscopy;  Laterality: N/A;   FINGER SURGERY     fracture   FLEXIBLE BRONCHOSCOPY N/A 05/29/2018   Procedure: FLEXIBLE BRONCHOSCOPY;  Surgeon: Laverle Hobby, MD;  Location: [facility];  Service: Pulmonary;  Laterality: N/A;   TONSILLECTOMY     WISDOM TOOTH EXTRACTION     Social History   Occupational History   Occupation: Recruitment consultant  Tobacco Use   Smoking status: Never   Smokeless tobacco: Never  Vaping Use   Vaping Use: Never used  Substance and Sexual Activity   Alcohol use: Yes    Alcohol/week: 0.0 standard drinks    Comment: rare   Drug use: No   Sexual activity: Yes    Partners: Female

## 2021-10-08 ENCOUNTER — Telehealth: Payer: No Typology Code available for payment source | Admitting: Physician Assistant

## 2021-10-08 DIAGNOSIS — J019 Acute sinusitis, unspecified: Secondary | ICD-10-CM

## 2021-10-08 DIAGNOSIS — B9789 Other viral agents as the cause of diseases classified elsewhere: Secondary | ICD-10-CM

## 2021-10-08 MED ORDER — FLUTICASONE PROPIONATE 50 MCG/ACT NA SUSP: 2.0000 | Freq: Every day | NASAL | 0 refills | Status: DC

## 2021-10-08 NOTE — Progress Notes (Signed)
I have spent 5 minutes in review of e-visit questionnaire, review and updating patient chart, medical decision making and response to patient.   Michelena Culmer Cody Dymphna Wadley, PA-C    

## 2021-10-08 NOTE — Progress Notes (Signed)

## 2021-11-04 ENCOUNTER — Other Ambulatory Visit: Payer: Self-pay

## 2021-11-04 ENCOUNTER — Ambulatory Visit: Payer: No Typology Code available for payment source | Admitting: Internal Medicine

## 2021-11-04 ENCOUNTER — Encounter: Payer: Self-pay | Admitting: Internal Medicine

## 2021-11-04 VITALS — BP 136/84 | HR 86 | Temp 97.7°F | Ht 75.0 in | Wt 197.2 lb

## 2021-11-04 DIAGNOSIS — Z1329 Encounter for screening for other suspected endocrine disorder: Secondary | ICD-10-CM

## 2021-11-04 DIAGNOSIS — N200 Calculus of kidney: Secondary | ICD-10-CM

## 2021-11-04 DIAGNOSIS — F419 Anxiety disorder, unspecified: Secondary | ICD-10-CM | POA: Insufficient documentation

## 2021-11-04 DIAGNOSIS — M419 Scoliosis, unspecified: Secondary | ICD-10-CM

## 2021-11-04 DIAGNOSIS — M51379 Other intervertebral disc degeneration, lumbosacral region without mention of lumbar back pain or lower extremity pain: Secondary | ICD-10-CM

## 2021-11-04 DIAGNOSIS — M5137 Other intervertebral disc degeneration, lumbosacral region: Secondary | ICD-10-CM | POA: Diagnosis not present

## 2021-11-04 DIAGNOSIS — Z1389 Encounter for screening for other disorder: Secondary | ICD-10-CM

## 2021-11-04 DIAGNOSIS — F4322 Adjustment disorder with anxiety: Secondary | ICD-10-CM

## 2021-11-04 DIAGNOSIS — I708 Atherosclerosis of other arteries: Secondary | ICD-10-CM

## 2021-11-04 DIAGNOSIS — I10 Essential (primary) hypertension: Secondary | ICD-10-CM

## 2021-11-04 DIAGNOSIS — R030 Elevated blood-pressure reading, without diagnosis of hypertension: Secondary | ICD-10-CM

## 2021-11-04 DIAGNOSIS — E785 Hyperlipidemia, unspecified: Secondary | ICD-10-CM

## 2021-11-04 DIAGNOSIS — E559 Vitamin D deficiency, unspecified: Secondary | ICD-10-CM

## 2021-11-04 DIAGNOSIS — E538 Deficiency of other specified B group vitamins: Secondary | ICD-10-CM

## 2021-11-04 DIAGNOSIS — R519 Headache, unspecified: Secondary | ICD-10-CM

## 2021-11-04 MED ORDER — FLUOXETINE HCL 10 MG PO TABS: 10.0000 mg | ORAL_TABLET | Freq: Every day | ORAL | 3 refills | Status: DC

## 2021-11-04 NOTE — Progress Notes (Signed)
Chief Complaint  Patient presents with   Hypertension   Headache   Anxiety   F/u  1. BP elevated 130s-150s/80s when anxious or in mid to low pain (seeing [facility]) and since wt gain but not elevated in late 2021 FH mom htn and mgm htn and overweight He stopped coffee, soda x 3 weeks  2. New baby since 06/2021 emmett and stress due to this at home/increased anxiety wants to start SSRI again and disc thriveworks therapy  Review of Systems  Musculoskeletal:  Positive for back pain.  Neurological:  Positive for headaches.  Psychiatric/Behavioral:  The patient is nervous/anxious.   Past Medical History:  Diagnosis Date   Anxiety    Colon polyp    Benign, colonoscopy 2015   COVID-19    06/24/20, 04/2021, 09/2021   GERD (gastroesophageal reflux disease)    Globus sensation    History of chicken pox    IBS (irritable bowel syndrome)    constipation   Lymph node enlargement    Wears contact lenses    Past Surgical History:  Procedure Laterality Date   COLONOSCOPY WITH ESOPHAGOGASTRODUODENOSCOPY (EGD)     ESOPHAGOGASTRODUODENOSCOPY (EGD) WITH PROPOFOL N/A 10/21/2017   Procedure: ESOPHAGOGASTRODUODENOSCOPY (EGD) WITH PROPOFOL;  Surgeon: Lucilla Lame, MD;  Location: [place];  Service: Endoscopy;  Laterality: N/A;   FINGER SURGERY     fracture   FLEXIBLE BRONCHOSCOPY N/A 05/29/2018   Procedure: FLEXIBLE BRONCHOSCOPY;  Surgeon: Laverle Hobby, MD;  Location: [facility];  Service: Pulmonary;  Laterality: N/A;   TONSILLECTOMY     VASECTOMY     09/2021   WISDOM TOOTH EXTRACTION     Family History  Problem Relation Age of Onset   Hypertension Mother    Thyroid disease Mother        Living   Goiter Mother    Cirrhosis Father 45       Deceased   Alcohol abuse Father        liver cirrhosis    Lymphoma Sister        Neck   Melanoma Sister    Skin cancer Sister    Hypertension Maternal Grandmother    Cancer Maternal Grandfather    Cancer Paternal Grandfather         Lung Cancer   Obesity Other    Social History   Socioeconomic History   Marital status: Married    Spouse name: Not on file   Number of children: Not on file   Years of education: Not on file   Highest education level: Not on file  Occupational History   Occupation: Recruitment consultant  Tobacco Use   Smoking status: Never   Smokeless tobacco: Never  Vaping Use   Vaping Use: Never used  Substance and Sexual Activity   Alcohol use: Yes    Alcohol/week: 0.0 standard drinks    Comment: rare   Drug use: No   Sexual activity: Yes    Partners: Female  Other Topics Concern   Not on file  Social History Narrative   Married wife is Paramedic   2 sons 6 10/2021 and 1 son  06/2021 Karleen Hampshire    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 Can increase to 20 mg (2 pills) in 2 weeks if needed   No Known Allergies No results found for this or any previous visit (from the past 2160 hour(s)). Objective  Body mass index is 24.65 kg/m. Wt Readings from Last 3 Encounters:  11/04/21 197 lb 3.2 oz (89.4 kg)  07/23/21 203 lb (92.1 kg)  04/14/21 198 lb 12.8 oz (90.2 kg)   Temp Readings from Last 3 Encounters:  11/04/21 97.7 F (36.5 C) (Temporal)  07/23/21 (!) 96.7 F (35.9 C) (Temporal)  04/14/21 97.7 F (36.5 C) (Oral)   BP Readings from Last 3 Encounters:  11/04/21 136/84  08/05/21 (!) 151/96  07/23/21 138/82   Pulse Readings from Last 3 Encounters:  11/04/21 86  08/05/21 79  07/23/21 87    Physical Exam Vitals and nursing note reviewed.  Constitutional:      Appearance: Normal  appearance. He is well-developed and well-groomed. He is obese.  HENT:     Head: Normocephalic and atraumatic.  Eyes:     Conjunctiva/sclera: Conjunctivae normal.     Pupils: Pupils are equal, round, and reactive to light.  Cardiovascular:     Rate and Rhythm: Normal rate and regular rhythm.     Heart sounds: Normal heart sounds.  Pulmonary:     Effort: Pulmonary effort is normal. No respiratory distress.     Breath sounds: Normal breath sounds.  Abdominal:     Tenderness: There is no abdominal tenderness.  Musculoskeletal:     Thoracic back: Tenderness present.     Lumbar back: Tenderness present. Negative right straight leg raise test and negative left straight leg raise test.  Skin:    General: Skin is warm and moist.  Neurological:     General: No focal deficit present.     Mental Status: He is alert and oriented to person, place, and time. Mental status is at baseline.     Sensory: Sensation is intact.     Motor: Motor function is intact.     Coordination: Coordination is intact.     Gait: Gait is intact. Gait normal.  Psychiatric:        Attention and Perception: Attention and perception normal.        Mood and Affect: Mood and affect normal.        Speech: Speech normal.        Behavior: Behavior normal. Behavior is cooperative.        Thought Content: Thought content normal.        Cognition and Memory: Cognition and memory normal.        Judgment: Judgment normal.    Assessment  Plan  Adjustment d/o with Anxiety - Plan: FLUoxetine (PROZAC) 10 MG tablet, consider increase to 20 mg  Disc thriveworks therapy   Hypertension/elevated bp monitor BP or log BP    DDD (degenerative disc disease), lumbosacral Scoliosis of thoracic spine, unspecified scoliosis type F/u ortho  HM Flu had in 2021  Tdap had 07/18/12  Pfizer 2/2 disc booster h/o covid 19 + x 3 declines further vaccines Never had HPV shot  Hep B immune   MMR rec given Rx prev    Colonoscopy due age 6  Dr Earlean Shawl sent ROI Saw derm 2-3 years ago from 2018/2019 mom h/o BCC shoulder  Saw in 05/2020 VV left hand no tx tbse Dr. Dian Situ  Ronnald Ramp [place] derm PSA due age 19 baseline est alliance   Provider: Dr. Olivia Mackie McLean-Scocuzza-Internal Medicine

## 2021-11-04 NOTE — Patient Instructions (Addendum)
Trend blood pressure goal <130/<80 (beet supplement, hibiscus tea no sugar)  Let me know in a couple weeks   Consider norvasc 2.5 mg daily for high blood pressure if continues to be elevated    Call to schedule an appointment telephone, video or in person  No referral needed you call to schedule appointment    [facility]  [place] [address] [phone]    [facility] [place]  [address]  [place] [address]  [phone]   High Cholesterol  High cholesterol is a condition in which the blood has high levels of a white, waxy substance similar to fat (cholesterol). The liver makes all the cholesterol that the body needs. The human body needs small amounts of cholesterol to help build cells. A person gets extra or excess cholesterol from the food that he or she eats. The blood carries cholesterol from the liver to the rest of the body. If you have high cholesterol, deposits (plaques) may build up on the walls of your arteries. Arteries are the blood vessels that carry blood away from your heart. These plaques make the arteries narrow and stiff. Cholesterol plaques increase your risk for heart attack and stroke. Work with your health care provider to keep your cholesterol levels in a healthy range. What increases the risk? The following factors may make you more likely to develop this condition: Eating foods that are high in animal fat (saturated fat) or cholesterol. Being overweight. Not getting enough exercise. A family history of high cholesterol (familial hypercholesterolemia). Use of tobacco products. Having diabetes. What are the signs or symptoms? In most cases, high cholesterol does not usually cause any symptoms. In severe cases, very high cholesterol levels can cause: Fatty bumps under the skin (xanthomas). A white or gray ring around the black center (pupil) of the  eye. How is this diagnosed? This condition may be diagnosed based on the results of a blood test. If you are older than 37 years of age, your health care provider may check your cholesterol levels every 4-6 years. You may be checked more often if you have high cholesterol or other risk factors for heart disease. The blood test for cholesterol measures: "Bad" cholesterol, or LDL cholesterol. This is the main type of cholesterol that causes heart disease. The desired level is less than 100 mg/dL (2.59 mmol/L). "Good" cholesterol, or HDL cholesterol. HDL helps protect against heart disease by cleaning the arteries and carrying the LDL to the liver for processing. The desired level for HDL is 60 mg/dL (1.55 mmol/L) or higher. Triglycerides. These are fats that your body can store or burn for energy. The desired level is less than 150 mg/dL (1.69 mmol/L). Total cholesterol. This measures the total amount of cholesterol in your blood and includes LDL, HDL, and triglycerides. The desired level is less than 200 mg/dL (5.17 mmol/L). How is this treated? Treatment for high cholesterol starts with lifestyle changes, such as diet and exercise. Diet changes. You may be asked to eat foods that have more fiber and less saturated fats or added sugar. Lifestyle changes. These may include regular exercise, maintaining a healthy weight, and quitting use of tobacco products. Medicines. These are given when diet and lifestyle changes have not worked. You may be prescribed a statin medicine to help lower your cholesterol levels. Follow these instructions at home: Eating and drinking  Eat a healthy, balanced diet. This diet includes:  Daily servings of a variety of fresh, frozen, or canned fruits and vegetables. Daily servings of whole grain foods that are rich in fiber. Foods that are low in saturated fats and trans fats. These include poultry and fish without skin, lean cuts of meat, and low-fat dairy products. A  variety of fish, especially oily fish that contain omega-3 fatty acids. Aim to eat fish at least 2 times a week. Avoid foods and drinks that have added sugar. Use healthy cooking methods, such as roasting, grilling, broiling, baking, poaching, steaming, and stir-frying. Do not fry your food except for stir-frying. If you drink alcohol: Limit how much you have to: 0-1 drink a day for women who are not pregnant. 0-2 drinks a day for men. Know how much alcohol is in a drink. In the [place] one drink equals one 12 oz bottle of beer (355 mL), one 5 oz glass of wine (148 mL), or one 1 oz glass of hard liquor (44 mL). Lifestyle  Get regular exercise. Aim to exercise for a total of 150 minutes a week. Increase your activity level by doing activities such as gardening, walking, and taking the stairs. Do not use any products that contain nicotine or tobacco. These products include cigarettes, chewing tobacco, and vaping devices, such as e-cigarettes. If you need help quitting, ask your health care provider. General instructions Take over-the-counter and prescription medicines only as told by your health care provider. Keep all follow-up visits. This is important. Where to find more information American Heart Association: www.heart.org National Heart, Lung, and Blood Institute: https://wilson-eaton.com/ Contact a health care provider if: You have trouble achieving or maintaining a healthy diet or weight. You are starting an exercise program. You are unable to stop smoking. Get help right away if: You have chest pain. You have trouble breathing. You have discomfort or pain in your jaw, neck, back, shoulder, or arm. You have any symptoms of a stroke. "BE FAST" is an easy way to remember the main warning signs of a stroke: B - Balance. Signs are dizziness, sudden trouble walking, or loss of balance. E - Eyes. Signs are trouble seeing or a sudden change in vision. F - Face. Signs are sudden weakness or numbness  of the face, or the face or eyelid drooping on one side. A - Arms. Signs are weakness or numbness in an arm. This happens suddenly and usually on one side of the body. S - Speech. Signs are sudden trouble speaking, slurred speech, or trouble understanding what people say. T - Time. Time to call emergency services. Write down what time symptoms started. You have other signs of a stroke, such as: A sudden, severe headache with no known cause. Nausea or vomiting. Seizure. These symptoms may represent a serious problem that is an emergency. Do not wait to see if the symptoms will go away. Get medical help right away. Call your local emergency services (911 in the [place] Do not drive yourself to the hospital. Summary Cholesterol plaques increase your risk for heart attack and stroke. Work with your health care provider to keep your cholesterol levels in a healthy range. Eat a healthy, balanced diet, get regular exercise, and maintain a healthy weight. Do not use any products that contain nicotine or tobacco. These products include cigarettes, chewing tobacco, and vaping devices, such as e-cigarettes. Get help right away if you have any symptoms of a stroke. This information is not intended to replace advice given to you by your health care provider. Make  sure you discuss any questions you have with your health care provider. Document Revised: 12/18/2020 Document Reviewed: 12/08/2020 Elsevier Patient Education  [place].   Cholesterol Content in Foods Cholesterol is a waxy, fat-like substance that helps to carry fat in the blood. The body needs cholesterol in small amounts, but too much cholesterol can cause damage to the arteries and heart. What foods have cholesterol?  Cholesterol is found in animal-based foods, such as meat, seafood, and dairy. Generally, low-fat dairy and lean meats have less cholesterol than full-fat dairy and fatty meats. The milligrams of cholesterol per serving (mg  per serving) of common cholesterol-containing foods are listed below. Meats and other proteins Egg -- one large whole egg has 186 mg. Veal shank -- 4 oz (113 g) has 141 mg. Lean ground [place] (93% lean) -- 4 oz (113 g) has 118 mg. Fat-trimmed lamb loin -- 4 oz (113 g) has 106 mg. Lean ground beef (90% lean) -- 4 oz (113 g) has 100 mg. Lobster -- 3.5 oz (99 g) has 90 mg. Pork loin chops -- 4 oz (113 g) has 86 mg. Canned salmon -- 3.5 oz (99 g) has 83 mg. Fat-trimmed beef top loin -- 4 oz (113 g) has 78 mg. Frankfurter -- 1 frank (3.5 oz or 99 g) has 77 mg. Crab -- 3.5 oz (99 g) has 71 mg. Roasted chicken without skin, white meat -- 4 oz (113 g) has 66 mg. Light bologna -- 2 oz (57 g) has 45 mg. Deli-cut [place] -- 2 oz (57 g) has 31 mg. Canned tuna -- 3.5 oz (99 g) has 31 mg. Berniece Salines -- 1 oz (28 g) has 29 mg. Oysters and mussels (raw) -- 3.5 oz (99 g) has 25 mg. Mackerel -- 1 oz (28 g) has 22 mg. Trout -- 1 oz (28 g) has 20 mg. Pork sausage -- 1 link (1 oz or 28 g) has 17 mg. Salmon -- 1 oz (28 g) has 16 mg. Tilapia -- 1 oz (28 g) has 14 mg. Dairy Soft-serve ice cream --  cup (4 oz or 86 g) has 103 mg. Whole-milk yogurt -- 1 cup (8 oz or 245 g) has 29 mg. Cheddar cheese -- 1 oz (28 g) has 28 mg. American cheese -- 1 oz (28 g) has 28 mg. Whole milk -- 1 cup (8 oz or 250 mL) has 23 mg. 2% milk -- 1 cup (8 oz or 250 mL) has 18 mg. Cream cheese -- 1 tablespoon (Tbsp) (14.5 g) has 15 mg. Cottage cheese --  cup (4 oz or 113 g) has 14 mg. Low-fat (1%) milk -- 1 cup (8 oz or 250 mL) has 10 mg. Sour cream -- 1 Tbsp (12 g) has 8.5 mg. Low-fat yogurt -- 1 cup (8 oz or 245 g) has 8 mg. Nonfat Greek yogurt -- 1 cup (8 oz or 228 g) has 7 mg. Half-and-half cream -- 1 Tbsp (15 mL) has 5 mg. Fats and oils Cod liver oil -- 1 tablespoon (Tbsp) (13.6 g) has 82 mg. Butter -- 1 Tbsp (14 g) has 15 mg. Lard -- 1 Tbsp (12.8 g) has 14 mg. Bacon grease -- 1 Tbsp (12.9 g) has 14 mg. Mayonnaise -- 1 Tbsp  (13.8 g) has 5-10 mg. Margarine -- 1 Tbsp (14 g) has 3-10 mg. The items listed above may not be a complete list of foods with cholesterol. Exact amounts of cholesterol in these foods may vary depending on specific ingredients and brands.  Contact a dietitian for more information. What foods do not have cholesterol? Most plant-based foods do not have cholesterol unless you combine them with a food that has cholesterol. Foods without cholesterol include: Grains and cereals. Vegetables. Fruits. Vegetable oils, such as olive, canola, and sunflower oil. Legumes, such as peas, beans, and lentils. Nuts and seeds. Egg whites. The items listed above may not be a complete list of foods that do not have cholesterol. Contact a dietitian for more information. Summary The body needs cholesterol in small amounts, but too much cholesterol can cause damage to the arteries and heart. Cholesterol is found in animal-based foods, such as meat, seafood, and dairy. Generally, low-fat dairy and lean meats have less cholesterol than full-fat dairy and fatty meats. This information is not intended to replace advice given to you by your health care provider. Make sure you discuss any questions you have with your health care provider. Document Revised: 02/13/2021 Document Reviewed: 02/13/2021 Elsevier Patient Education  [place].   Adjustment Disorder, Adult Adjustment disorder is a group of symptoms that can develop after a stressful life event, such as the loss of a job or a serious physical illness. The symptoms can affect how you feel, think, and act. They may also interfere with your relationships. Adjustment disorder increases your risk of suicide and substance abuse. If adjustment disorder is not managed early, it can make medical conditions that you already have worse. If the stressful life event persists, the disorder may continue and become a persistent form of adjustment disorder. What are the  causes? This condition is caused by difficulty recovering from or coping with a stressful life event. What increases the risk? You are more likely to develop this condition if: You have had previous problems coping with life stressors. You are being treated for a long-term (chronic) illness. You are being treated for an illness that cannot be cured (terminal illness). You have a family history of mental illness. What are the signs or symptoms? Symptoms of this condition include: Behavioral symptoms such as: Trouble doing daily tasks. Reckless driving. Poor work Systems analyst. Ignoring bills. Avoiding family and friends. Impulsive actions. Emotional symptoms such as: Sadness, depression, or crying spells. Worrying a lot, or feeling nervous or anxious. Loss of enjoyment. Feelings of loss or hopelessness. Irritability. Thoughts of suicide. Physical symptoms such as: Change in appetite or weight. Complaining of feeling sick without being ill. Feeling dazed or disconnected. Nightmares. Trouble sleeping. Symptoms of this condition start within 3 months of the stressful event. They do not last more than 6 months, unless the stressful circumstances last longer. Normal grieving after the death of a loved one is not a symptom of this condition. How is this diagnosed? To diagnose this condition, your health care provider will ask about what has happened in your life and how it has affected you. He or she may also ask about your medical history and your use of medicines, alcohol, and other substances. Your health care provider may do a physical exam and order lab tests or other studies. You may be referred to a mental health specialist. How is this treated? Treatment options for this condition include: Counseling or talk therapy. Talk therapy is usually provided by mental health specialists. This therapy may be individual or may involve family members. Medicines. Certain medicines may help with  depression, anxiety, and sleep. Support groups. These offer emotional support, advice, and guidance. They are made up of people who have had similar experiences. Observation and  time. This is sometimes called watchful waiting. In this treatment, health care providers monitor your health and behavior without other treatment. Adjustment disorder sometimes gets better on its own with time. Follow these instructions at home: Take over-the-counter and prescription medicines only as told by your health care provider. Keep all follow-up visits. This is important. Contact trusted family and friends for support. Let them know what is going on with you and how they can help. Contact a health care provider if: Your symptoms do not improve in 6 months. Your symptoms get worse. Get help right away if: You have serious thoughts about hurting yourself or someone else. If you ever feel like you may hurt yourself or others, or have thoughts about taking your own life, get help right away. Go to your nearest emergency department or: Call your local emergency services (911 in the [place] Call a suicide crisis helpline, such as the [place] at 843-080-7385 or 988 in the [place]. This is open 24 hours a day in the [place] Text the Crisis Text Line at 629-260-5461 (in the [place] Summary Adjustment disorder is a group of symptoms that can develop after a stressful life event, such as the loss of a job or a serious physical illness. The symptoms can affect how you feel, think, and act. They may interfere with your relationships. Symptoms of this condition start within 3 months of the stressful event. They do not last more than 6 months, unless the stressful circumstances last longer. Treatment may include talk therapy, medicines, participation in a support group, or observation to see if symptoms improve. Contact your health care provider if your symptoms get worse or do not improve in 6 months. If you  ever feel like you may hurt yourself or others, or have thoughts about taking your own life, get help right away. This information is not intended to replace advice given to you by your health care provider. Make sure you discuss any questions you have with your health care provider. Document Revised: 04/29/2021 Document Reviewed: 02/15/2020 Elsevier Patient Education  2022 [place] To Do list over the counter if sick   These are over the counter medication options:  Mucinex dm green label for cough or robitussin DM  Multivitamin or below vitamins  Vitamin C 1000 mg daily.  Vitamin D3 4000 Iu (units) daily.  Zinc 100 mg daily.  Quercetin 250-500 mg 2 times per day   Elderberry  Oil of oregano  cepacol or chloroseptic spray Warm salt water gargles +hydrogen peroxide Sugar free cough drops  Warm tea with honey and lemon  Hydration  Try to eat though you dont feel like it   Tylenol or Advil  Nasal saline and Flonase 2 sprays nasal congestion  If sneezing/runny nose over the counter allergy pill claritin,allegra, zyrtec, xyzal Quarantine x 10-14 days 14 days preferred   Monitor pulse oximeter, buy from [place] if oxygen is less than 90 please go to the hospital.        Are you feeling really sick? Shortness of breath, cough, chest pain?, dizziness? Confusion   If so let me know  If worsening, go to hospital or [facility] clinic Urgent care for further treatment.     Managing Your Hypertension Hypertension, also called high blood pressure, is when the force of the blood pressing against the walls of the arteries is too strong. Arteries are blood vessels that carry blood from your heart throughout your body. Hypertension forces the  heart to work harder to pump blood and may cause the arteries to become narrow or stiff. Understanding blood pressure readings Your personal target blood pressure may vary depending on your medical conditions, your age, and other factors. A blood  pressure reading includes a higher number over a lower number. Ideally, your blood pressure should be below 120/80. You should know that: The first, or top, number is called the systolic pressure. It is a measure of the pressure in your arteries as your heart beats. The second, or bottom number, is called the diastolic pressure. It is a measure of the pressure in your arteries as the heart relaxes. Blood pressure is classified into four stages. Based on your blood pressure reading, your health care provider may use the following stages to determine what type of treatment you need, if any. Systolic pressure and diastolic pressure are measured in a unit called mmHg. Normal Systolic pressure: below 123456. Diastolic pressure: below 80. Elevated Systolic pressure: [id number]. Diastolic pressure: below 80. Hypertension stage 1 Systolic pressure: 0000000. Diastolic pressure: [id number]. Hypertension stage 2 Systolic pressure: [id number] or above. Diastolic pressure: 90 or above. How can this condition affect me? Managing your hypertension is an important responsibility. Over time, hypertension can damage the arteries and decrease blood flow to important parts of the body, including the brain, heart, and kidneys. Having untreated or uncontrolled hypertension can lead to: A heart attack. A stroke. A weakened blood vessel (aneurysm). Heart failure. Kidney damage. Eye damage. Metabolic syndrome. Memory and concentration problems. Vascular dementia. What actions can I take to manage this condition? Hypertension can be managed by making lifestyle changes and possibly by taking medicines. Your health care provider will help you make a plan to bring your blood pressure within a normal range. Nutrition  Eat a diet that is high in fiber and potassium, and low in salt (sodium), added sugar, and fat. An example eating plan is called the Dietary Approaches to Stop Hypertension (DASH) diet. To eat this way: Eat plenty of  fresh fruits and vegetables. Try to fill one-half of your plate at each meal with fruits and vegetables. Eat whole grains, such as whole-wheat pasta, brown rice, or whole-grain bread. Fill about one-fourth of your plate with whole grains. Eat low-fat dairy products. Avoid fatty cuts of meat, processed or cured meats, and poultry with skin. Fill about one-fourth of your plate with lean proteins such as fish, chicken without skin, beans, eggs, and tofu. Avoid pre-made and processed foods. These tend to be higher in sodium, added sugar, and fat. Reduce your daily sodium intake. Most people with hypertension should eat less than 1,500 mg of sodium a day. Lifestyle  Work with your health care provider to maintain a healthy body weight or to lose weight. Ask what an ideal weight is for you. Get at least 30 minutes of exercise that causes your heart to beat faster (aerobic exercise) most days of the week. Activities may include walking, swimming, or biking. Include exercise to strengthen your muscles (resistance exercise), such as weight lifting, as part of your weekly exercise routine. Try to do these types of exercises for 30 minutes at least 3 days a week. Do not use any products that contain nicotine or tobacco, such as cigarettes, e-cigarettes, and chewing tobacco. If you need help quitting, ask your health care provider. Control any long-term (chronic) conditions you have, such as high cholesterol or diabetes. Identify your sources of stress and find ways to manage stress. This may include meditation,  deep breathing, or making time for fun activities. Alcohol use Do not drink alcohol if: Your health care provider tells you not to drink. You are pregnant, may be pregnant, or are planning to become pregnant. If you drink alcohol: Limit how much you use to: 0-1 drink a day for women. 0-2 drinks a day for men. Be aware of how much alcohol is in your drink. In the [place] one drink equals one 12 oz  bottle of beer (355 mL), one 5 oz glass of wine (148 mL), or one 1 oz glass of hard liquor (44 mL). Medicines Your health care provider may prescribe medicine if lifestyle changes are not enough to get your blood pressure under control and if: Your systolic blood pressure is 130 or higher. Your diastolic blood pressure is 80 or higher. Take medicines only as told by your health care provider. Follow the directions carefully. Blood pressure medicines must be taken as told by your health care provider. The medicine does not work as well when you skip doses. Skipping doses also puts you at risk for problems. Monitoring Before you monitor your blood pressure: Do not smoke, drink caffeinated beverages, or exercise within 30 minutes before taking a measurement. Use the bathroom and empty your bladder (urinate). Sit quietly for at least 5 minutes before taking measurements. Monitor your blood pressure at home as told by your health care provider. To do this: Sit with your back straight and supported. Place your feet flat on the floor. Do not cross your legs. Support your arm on a flat surface, such as a table. Make sure your upper arm is at heart level. Each time you measure, take two or three readings one minute apart and record the results. You may also need to have your blood pressure checked regularly by your health care provider. General information Talk with your health care provider about your diet, exercise habits, and other lifestyle factors that may be contributing to hypertension. Review all the medicines you take with your health care provider because there may be side effects or interactions. Keep all visits as told by your health care provider. Your health care provider can help you create and adjust your plan for managing your high blood pressure. Where to find more information National Heart, Lung, and Blood Institute: https://wilson-eaton.com/ American Heart Association:  www.heart.org Contact a health care provider if: You think you are having a reaction to medicines you have taken. You have repeated (recurrent) headaches. You feel dizzy. You have swelling in your ankles. You have trouble with your vision. Get help right away if: You develop a severe headache or confusion. You have unusual weakness or numbness, or you feel faint. You have severe pain in your chest or abdomen. You vomit repeatedly. You have trouble breathing. These symptoms may represent a serious problem that is an emergency. Do not wait to see if the symptoms will go away. Get medical help right away. Call your local emergency services (911 in the [place] Do not drive yourself to the hospital. Summary Hypertension is when the force of blood pumping through your arteries is too strong. If this condition is not controlled, it may put you at risk for serious complications. Your personal target blood pressure may vary depending on your medical conditions, your age, and other factors. For most people, a normal blood pressure is less than 120/80. Hypertension is managed by lifestyle changes, medicines, or both. Lifestyle changes to help manage hypertension include losing weight, eating a healthy, low-sodium  diet, exercising more, stopping smoking, and limiting alcohol. This information is not intended to replace advice given to you by your health care provider. Make sure you discuss any questions you have with your health care provider. Document Revised: 10/22/2019 Document Reviewed: 09/04/2019 Elsevier Patient Education  2022 [place].  How to Take Your Blood Pressure Blood pressure measures how strongly your blood is pressing against the walls of your arteries. Arteries are blood vessels that carry blood from your heart throughout your body. You can take your blood pressure at home with a machine. You may need to check your blood pressure at home: To check if you have high blood pressure  (hypertension). To check your blood pressure over time. To make sure your blood pressure medicine is working. Supplies needed: Blood pressure machine, or monitor. Dining room chair to sit in. Table or desk. Small notebook. Pencil or pen. How to prepare Avoid these things for 30 minutes before checking your blood pressure: Having drinks with caffeine in them, such as coffee or tea. Drinking alcohol. Eating. Smoking. Exercising. Do these things five minutes before checking your blood pressure: Go to the bathroom and pee (urinate). Sit in a dining chair. Do not sit on a soft couch or an armchair. Be quiet. Do not talk. How to take your blood pressure Follow the instructions that came with your machine. If you have a digital blood pressure monitor, these may be the instructions: Sit up straight. Place your feet on the floor. Do not cross your ankles or legs. Rest your left arm at the level of your heart. You may rest it on a table, desk, or chair. Pull up your shirt sleeve. Wrap the blood pressure cuff around the upper part of your left arm. The cuff should be 1 inch (2.5 cm) above your elbow. It is best to wrap the cuff around bare skin. Fit the cuff snugly around your arm. You should be able to place only one finger between the cuff and your arm. Place the cord so that it rests in the bend of your elbow. Press the power button. Sit quietly while the cuff fills with air and loses air. Write down the numbers on the screen. Wait 2-3 minutes and then repeat steps 1-10. What do the numbers mean? Two numbers make up your blood pressure. The first number is called systolic pressure. The second is called diastolic pressure. An example of a blood pressure reading is "120 over 80" (or 120/80). If you are an adult and do not have a medical condition, use this guide to find out if your blood pressure is normal: Normal First number: below 120. Second number: below 80. Elevated First number:  120-129. Second number: below 80. Hypertension stage 1 First number: 130-139. Second number: 80-89. Hypertension stage 2 First number: 140 or above. Second number: 32 or above. Your blood pressure is above normal even if only the first or only the second number is above normal. Follow these instructions at home: Medicines Take over-the-counter and prescription medicines only as told by your doctor. Tell your doctor if your medicine is causing side effects. General instructions Check your blood pressure as often as your doctor tells you to. Check your blood pressure at the same time every day. Take your monitor to your next doctor's appointment. Your doctor will: Make sure you are using it correctly. Make sure it is working right. Understand what your blood pressure numbers should be. Keep all follow-up visits as told by your doctor.  This is important. General tips You will need a blood pressure machine, or monitor. Your doctor can suggest a monitor. You can buy one at a drugstore or online. When choosing one: Choose one with an arm cuff. Choose one that wraps around your upper arm. Only one finger should fit between your arm and the cuff. Do not choose one that measures your blood pressure from your wrist or finger. Where to find more information American Heart Association: www.heart.org Contact a doctor if: Your blood pressure keeps being high. Your blood pressure is suddenly low. Get help right away if: Your first blood pressure number is higher than 180. Your second blood pressure number is higher than 120. Summary Check your blood pressure at the same time every day. Avoid caffeine, alcohol, smoking, and exercise for 30 minutes before checking your blood pressure. Make sure you understand what your blood pressure numbers should be. This information is not intended to replace advice given to you by your health care provider. Make sure you discuss any questions you have with  your health care provider. Document Revised: 08/13/2020 Document Reviewed: 09/28/2019 Elsevier Patient Education  2022 [place] Eating Plan DASH stands for Dietary Approaches to Stop Hypertension. The DASH eating plan is a healthy eating plan that has been shown to: Reduce high blood pressure (hypertension). Reduce your risk for type 2 diabetes, heart disease, and stroke. Help with weight loss. What are tips for following this plan? Reading food labels Check food labels for the amount of salt (sodium) per serving. Choose foods with less than 5 percent of the Daily Value of sodium. Generally, foods with less than 300 milligrams (mg) of sodium per serving fit into this eating plan. To find whole grains, look for the word "whole" as the first word in the ingredient list. Shopping Buy products labeled as "low-sodium" or "no salt added." Buy fresh foods. Avoid canned foods and pre-made or frozen meals. Cooking Avoid adding salt when cooking. Use salt-free seasonings or herbs instead of table salt or sea salt. Check with your health care provider or pharmacist before using salt substitutes. Do not fry foods. Cook foods using healthy methods such as baking, boiling, grilling, roasting, and broiling instead. Cook with heart-healthy oils, such as olive, canola, avocado, soybean, or sunflower oil. Meal planning  Eat a balanced diet that includes: 4 or more servings of fruits and 4 or more servings of vegetables each day. Try to fill one-half of your plate with fruits and vegetables. 6-8 servings of whole grains each day. Less than 6 oz (170 g) of lean meat, poultry, or fish each day. A 3-oz (85-g) serving of meat is about the same size as a deck of cards. One egg equals 1 oz (28 g). 2-3 servings of low-fat dairy each day. One serving is 1 cup (237 mL). 1 serving of nuts, seeds, or beans 5 times each week. 2-3 servings of heart-healthy fats. Healthy fats called omega-3 fatty acids are  found in foods such as walnuts, flaxseeds, fortified milks, and eggs. These fats are also found in cold-water fish, such as sardines, salmon, and mackerel. Limit how much you eat of: Canned or prepackaged foods. Food that is high in trans fat, such as some fried foods. Food that is high in saturated fat, such as fatty meat. Desserts and other sweets, sugary drinks, and other foods with added sugar. Full-fat dairy products. Do not salt foods before eating. Do not eat more than 4 egg yolks a week.  Try to eat at least 2 vegetarian meals a week. Eat more home-cooked food and less restaurant, buffet, and fast food. Lifestyle When eating at a restaurant, ask that your food be prepared with less salt or no salt, if possible. If you drink alcohol: Limit how much you use to: 0-1 drink a day for women who are not pregnant. 0-2 drinks a day for men. Be aware of how much alcohol is in your drink. In the [place] one drink equals one 12 oz bottle of beer (355 mL), one 5 oz glass of wine (148 mL), or one 1 oz glass of hard liquor (44 mL). General information Avoid eating more than 2,300 mg of salt a day. If you have hypertension, you may need to reduce your sodium intake to 1,500 mg a day. Work with your health care provider to maintain a healthy body weight or to lose weight. Ask what an ideal weight is for you. Get at least 30 minutes of exercise that causes your heart to beat faster (aerobic exercise) most days of the week. Activities may include walking, swimming, or biking. Work with your health care provider or dietitian to adjust your eating plan to your individual calorie needs. What foods should I eat? Fruits All fresh, dried, or frozen fruit. Canned fruit in natural juice (without added sugar). Vegetables Fresh or frozen vegetables (raw, steamed, roasted, or grilled). Low-sodium or reduced-sodium tomato and vegetable juice. Low-sodium or reduced-sodium tomato sauce and tomato paste. Low-sodium  or reduced-sodium canned vegetables. Grains Whole-grain or whole-wheat bread. Whole-grain or whole-wheat pasta. Brown rice. [place]. Bulgur. Whole-grain and low-sodium cereals. Pita bread. Low-fat, low-sodium crackers. Whole-wheat flour tortillas. Meats and other proteins Skinless chicken or [place]. Ground chicken or [place]. Pork with fat trimmed off. Fish and seafood. Egg whites. Dried beans, peas, or lentils. Unsalted nuts, nut butters, and seeds. Unsalted canned beans. Lean cuts of beef with fat trimmed off. Low-sodium, lean precooked or cured meat, such as sausages or meat loaves. Dairy Low-fat (1%) or fat-free (skim) milk. Reduced-fat, low-fat, or fat-free cheeses. Nonfat, low-sodium ricotta or cottage cheese. Low-fat or nonfat yogurt. Low-fat, low-sodium cheese. Fats and oils Soft margarine without trans fats. Vegetable oil. Reduced-fat, low-fat, or light mayonnaise and salad dressings (reduced-sodium). Canola, safflower, olive, avocado, soybean, and sunflower oils. Avocado. Seasonings and condiments Herbs. Spices. Seasoning mixes without salt. Other foods Unsalted popcorn and pretzels. Fat-free sweets. The items listed above may not be a complete list of foods and beverages you can eat. Contact a dietitian for more information. What foods should I avoid? Fruits Canned fruit in a light or heavy syrup. Fried fruit. Fruit in cream or butter sauce. Vegetables Creamed or fried vegetables. Vegetables in a cheese sauce. Regular canned vegetables (not low-sodium or reduced-sodium). Regular canned tomato sauce and paste (not low-sodium or reduced-sodium). Regular tomato and vegetable juice (not low-sodium or reduced-sodium). Angie Fava. Olives. Grains Baked goods made with fat, such as croissants, muffins, or some breads. Dry pasta or rice meal packs. Meats and other proteins Fatty cuts of meat. Ribs. Fried meat. Berniece Salines. Bologna, salami, and other precooked or cured meats, such as sausages or  meat loaves. Fat from the back of a pig (fatback). Bratwurst. Salted nuts and seeds. Canned beans with added salt. Canned or smoked fish. Whole eggs or egg yolks. Chicken or [place] with skin. Dairy Whole or 2% milk, cream, and half-and-half. Whole or full-fat cream cheese. Whole-fat or sweetened yogurt. Full-fat cheese. Nondairy creamers. Whipped toppings. Processed cheese and cheese spreads.  Fats and oils Butter. Stick margarine. Lard. Shortening. Ghee. Bacon fat. Tropical oils, such as coconut, palm kernel, or palm oil. Seasonings and condiments Onion salt, garlic salt, seasoned salt, table salt, and sea salt. Worcestershire sauce. Tartar sauce. Barbecue sauce. Teriyaki sauce. Soy sauce, including reduced-sodium. Steak sauce. Canned and packaged gravies. Fish sauce. Oyster sauce. Cocktail sauce. Store-bought horseradish. Ketchup. Mustard. Meat flavorings and tenderizers. Bouillon cubes. Hot sauces. Pre-made or packaged marinades. Pre-made or packaged taco seasonings. Relishes. Regular salad dressings. Other foods Salted popcorn and pretzels. The items listed above may not be a complete list of foods and beverages you should avoid. Contact a dietitian for more information. Where to find more information National Heart, Lung, and Blood Institute: https://wilson-eaton.com/ American Heart Association: www.heart.org Academy of Nutrition and Dietetics: www.eatright.[place]: www.kidney.org Summary The DASH eating plan is a healthy eating plan that has been shown to reduce high blood pressure (hypertension). It may also reduce your risk for type 2 diabetes, heart disease, and stroke. When on the DASH eating plan, aim to eat more fresh fruits and vegetables, whole grains, lean proteins, low-fat dairy, and heart-healthy fats. With the DASH eating plan, you should limit salt (sodium) intake to 2,300 mg a day. If you have hypertension, you may need to reduce your sodium intake to 1,500 mg a  day. Work with your health care provider or dietitian to adjust your eating plan to your individual calorie needs. This information is not intended to replace advice given to you by your health care provider. Make sure you discuss any questions you have with your health care provider. Document Revised: 09/07/2019 Document Reviewed: 09/07/2019 Elsevier Patient Education  2022 [place]. Amlodipine Tablets What is this medication? AMLODIPINE (am LOE di peen) treats high blood pressure and prevents chest pain (angina). It works by relaxing the blood vessels, which helps decrease the amount of work your heart has to do. It belongs to a group of medications called calcium channel blockers. This medicine may be used for other purposes; ask your health care provider or pharmacist if you have questions. COMMON BRAND NAME(S): Norvasc What should I tell my care team before I take this medication? They need to know if you have any of these conditions: Heart disease Liver disease An unusual or allergic reaction to amlodipine, other medications, foods, dyes, or preservatives Pregnant or trying to get pregnant Breast-feeding How should I use this medication? Take this medication by mouth. Take it as directed on the prescription label at the same time every day. You can take it with or without food. If it upsets your stomach, take it with food. Keep taking it unless your care team tells you to stop. Talk to your care team about the use of this medication in children. While it may be prescribed for children as young as 6 for selected conditions, precautions do apply. Overdosage: If you think you have taken too much of this medicine contact a poison control center or emergency room at once. NOTE: This medicine is only for you. Do not share this medicine with others. What if I miss a dose? If you miss a dose, take it as soon as you can. If it is almost time for your next dose, take only that dose. Do not  take double or extra doses. What may interact with this medication? Clarithromycin Cyclosporine Diltiazem Itraconazole Simvastatin Tacrolimus This list may not describe all possible interactions. Give your health care provider a list of all the medicines, herbs,  non-prescription drugs, or dietary supplements you use. Also tell them if you smoke, drink alcohol, or use illegal drugs. Some items may interact with your medicine. What should I watch for while using this medication? Visit your health care provider for regular checks on your progress. Check your blood pressure as directed. Ask your health care provider what your blood pressure should be. Also, find out when you should contact him or her. Do not treat yourself for coughs, colds, or pain while you are using this medication without asking your health care provider for advice. Some medications may increase your blood pressure. You may get drowsy or dizzy. Do not drive, use machinery, or do anything that needs mental alertness until you know how this medication affects you. Do not stand up or sit up quickly, especially if you are an older patient. This reduces the risk of dizzy or fainting spells. Alcohol can make you more drowsy and dizzy. Avoid alcoholic drinks. What side effects may I notice from receiving this medication? Side effects that you should report to your care team as soon as possible: Allergic reactions--skin rash, itching, hives, swelling of the face, lips, tongue, or throat Heart attack--pain or tightness in the chest, shoulders, arms, or jaw, nausea, shortness of breath, cold or clammy skin, feeling faint or lightheaded Low blood pressure--dizziness, feeling faint or lightheaded, blurry vision Side effects that usually do not require medical attention (report these to your care team if they continue or are bothersome): Facial flushing, redness Heart palpitations--rapid, pounding, or irregular heartbeat Nausea Stomach  pain Swelling of the ankles, hands, or feet This list may not describe all possible side effects. Call your doctor for medical advice about side effects. You may report side effects to FDA at 1-800-FDA-1088. Where should I keep my medication? Keep out of the reach of children and pets. Store at room temperature between 20 and 25 degrees C (68 and 77 degrees F). Protect from light and moisture. Keep the container tightly closed. Get rid of any unused medication after the expiration date. To get rid of medications that are no longer needed or have expired: Take the medication to a medication take-back program. Check with your pharmacy or law enforcement to find a location. If you cannot return the medication, check the label or package insert to see if the medication should be thrown out in the garbage or flushed down the toilet. If you are not sure, ask your health care provider. If it is safe to put in the trash, empty the medication out of the container. Mix the medication with cat litter, dirt, coffee grounds, or other unwanted substance. Seal the mixture in a bag or container. Put it in the trash. NOTE: This sheet is a summary. It may not cover all possible information. If you have questions about this medicine, talk to your doctor, pharmacist, or health care provider.  2022 Elsevier/Gold Standard (2021-06-23 00:00:00)

## 2021-12-14 ENCOUNTER — Other Ambulatory Visit: Payer: No Typology Code available for payment source

## 2022-02-02 ENCOUNTER — Ambulatory Visit: Payer: No Typology Code available for payment source | Admitting: Internal Medicine

## 2022-02-19 IMAGING — DX DG CHEST 2V
2 series · 2 of 2 positions shown · non-contrast
Comparison: Concurrently obtained rib series

CLINICAL DATA: Left chest wall pain

EXAM:
CHEST - 2 VIEW

[chest pa]
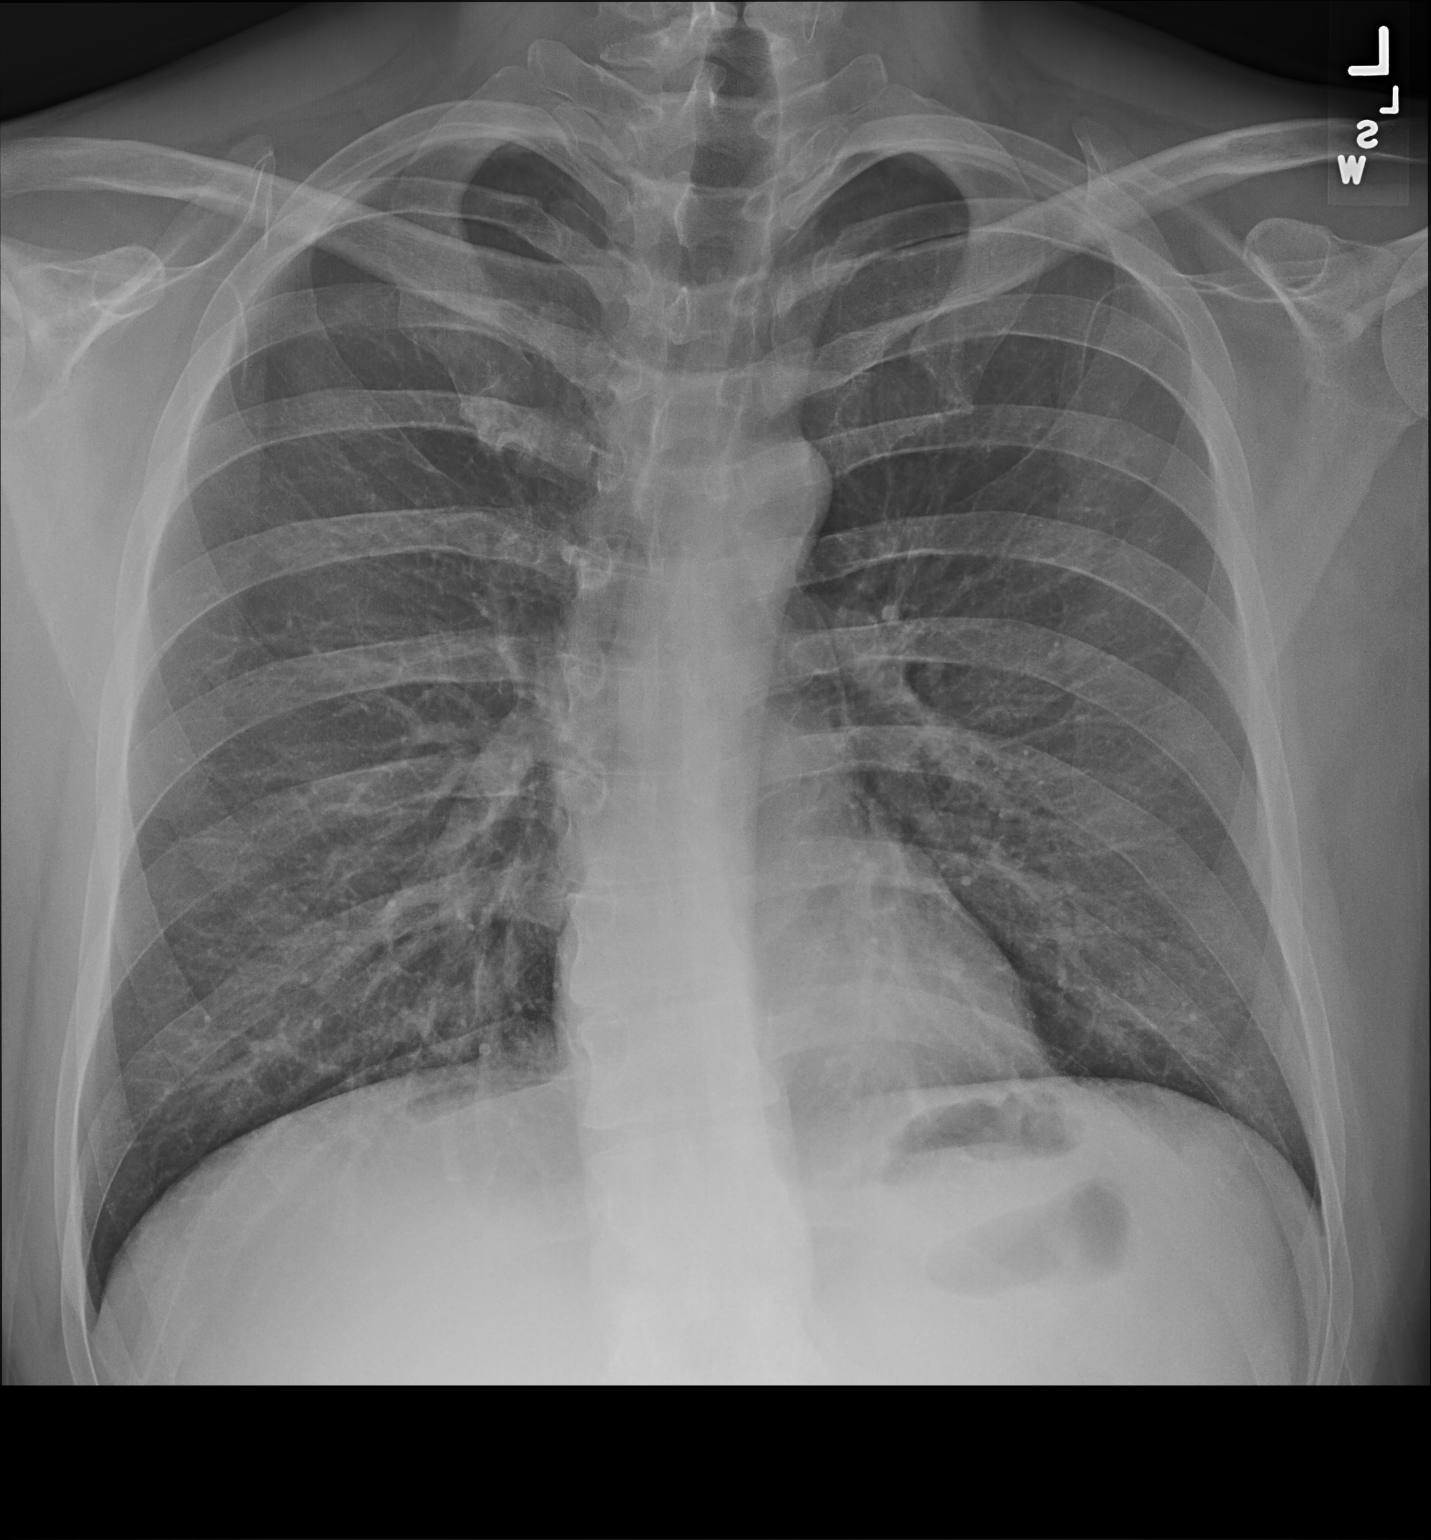

[chest lat]
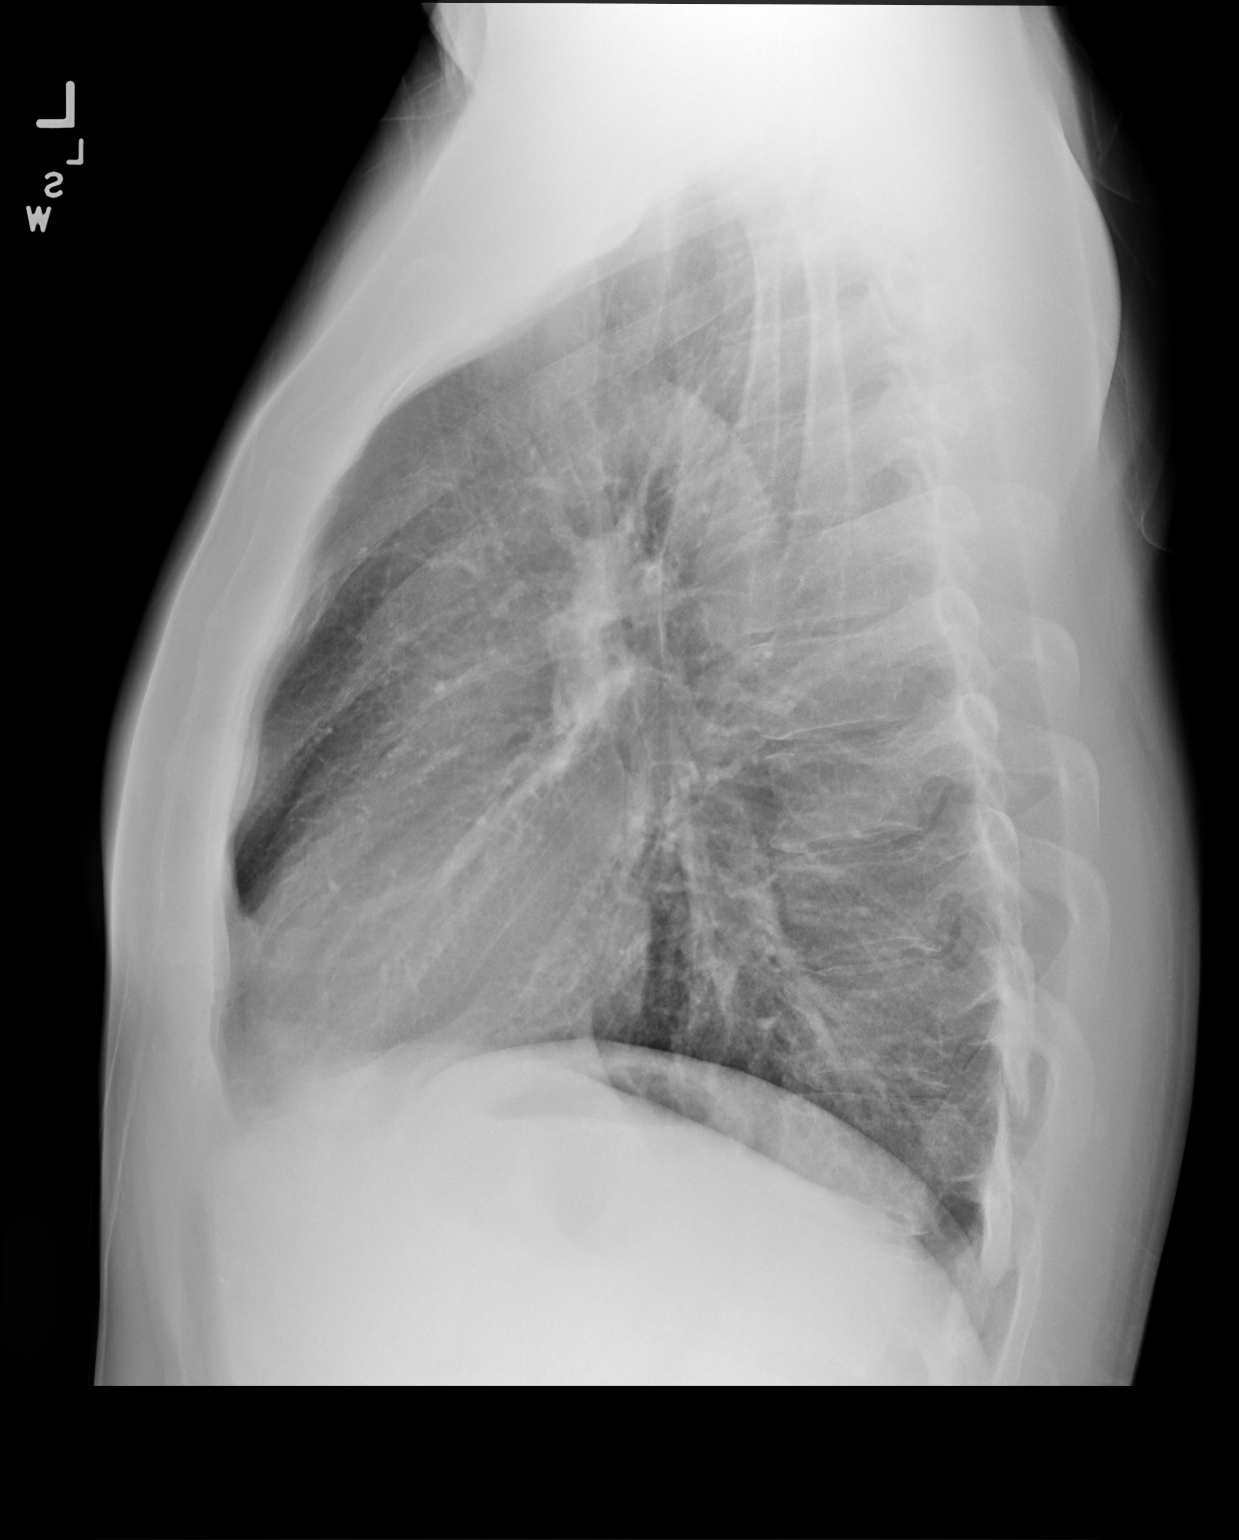

[2 of 2 positions shown; findings below may reference images not displayed]

FINDINGS: The lungs are clear and negative for focal airspace consolidation,
pulmonary edema or suspicious pulmonary nodule. No pleural effusion
or pneumothorax. Cardiac and mediastinal contours are within normal
limits. No acute fracture or lytic or blastic osseous lesions. The
visualized upper abdominal bowel gas pattern is unremarkable. Mild
dextroconvex scoliosis.
IMPRESSION: No active cardiopulmonary disease.

## 2022-02-24 ENCOUNTER — Other Ambulatory Visit (INDEPENDENT_AMBULATORY_CARE_PROVIDER_SITE_OTHER): Payer: No Typology Code available for payment source

## 2022-02-24 DIAGNOSIS — E559 Vitamin D deficiency, unspecified: Secondary | ICD-10-CM

## 2022-02-24 DIAGNOSIS — F419 Anxiety disorder, unspecified: Secondary | ICD-10-CM | POA: Diagnosis not present

## 2022-02-24 DIAGNOSIS — Z1329 Encounter for screening for other suspected endocrine disorder: Secondary | ICD-10-CM | POA: Diagnosis not present

## 2022-02-24 DIAGNOSIS — R030 Elevated blood-pressure reading, without diagnosis of hypertension: Secondary | ICD-10-CM

## 2022-02-24 DIAGNOSIS — I10 Essential (primary) hypertension: Secondary | ICD-10-CM

## 2022-02-24 DIAGNOSIS — Z1389 Encounter for screening for other disorder: Secondary | ICD-10-CM

## 2022-02-24 DIAGNOSIS — E538 Deficiency of other specified B group vitamins: Secondary | ICD-10-CM

## 2022-02-24 LAB — COMPREHENSIVE METABOLIC PANEL
ALT: 22 U/L (ref 0–53)
AST: 17 U/L (ref 0–37)
Albumin: 4.4 g/dL (ref 3.5–5.2)
Alkaline Phosphatase: 73 U/L (ref 39–117)
BUN: 12 mg/dL (ref 6–23)
CO2: 31 mEq/L (ref 19–32)
Calcium: 9.2 mg/dL (ref 8.4–10.5)
Chloride: 103 mEq/L (ref 96–112)
Creatinine, Ser: 0.81 mg/dL (ref 0.40–1.50)
GFR: 113.17 mL/min (ref >60.00)
Glucose, Bld: 83 mg/dL (ref 70–99)
Potassium: 4.4 mEq/L (ref 3.5–5.1)
Sodium: 140 mEq/L (ref 135–145)
Total Bilirubin: 0.7 mg/dL (ref 0.2–1.2)
Total Protein: 6.8 g/dL (ref 6.0–8.3)

## 2022-02-24 LAB — CBC WITH DIFFERENTIAL/PLATELET
Basophils Absolute: 0 10*3/uL (ref 0.0–0.1)
Basophils Relative: 0.4 % (ref 0.0–3.0)
Eosinophils Absolute: 0.1 10*3/uL (ref 0.0–0.7)
Eosinophils Relative: 2.3 % (ref 0.0–5.0)
HCT: 43.7 % (ref 39.0–52.0)
Hemoglobin: 15 g/dL (ref 13.0–17.0)
Lymphocytes Relative: 39.4 % (ref 12.0–46.0)
Lymphs Abs: 2.1 10*3/uL (ref 0.7–4.0)
MCHC: 34.2 g/dL (ref 30.0–36.0)
MCV: 89.7 fl (ref 78.0–100.0)
Monocytes Absolute: 0.4 10*3/uL (ref 0.1–1.0)
Monocytes Relative: 8.2 % (ref 3.0–12.0)
Neutro Abs: 2.6 10*3/uL (ref 1.4–7.7)
Neutrophils Relative %: 49.7 % (ref 43.0–77.0)
Platelets: 203 10*3/uL (ref 150.0–400.0)
RBC: 4.87 Mil/uL (ref 4.22–5.81)
RDW: 13.2 % (ref 11.5–15.5)
WBC: 5.3 10*3/uL (ref 4.0–10.5)

## 2022-02-24 LAB — LIPID PANEL
Cholesterol: 197 mg/dL (ref 0–200)
HDL: 36.8 mg/dL — ABNORMAL LOW (ref >39.00)
LDL Cholesterol: 143 mg/dL — ABNORMAL HIGH (ref 0–99)
NonHDL: 160.24
Total CHOL/HDL Ratio: 5
Triglycerides: 84 mg/dL (ref 0.0–149.0)
VLDL: 16.8 mg/dL (ref 0.0–40.0)

## 2022-02-24 LAB — VITAMIN D 25 HYDROXY (VIT D DEFICIENCY, FRACTURES): VITD: 28.29 ng/mL — ABNORMAL LOW (ref 30.00–100.00)

## 2022-02-24 LAB — TSH: TSH: 1.55 u[IU]/mL (ref 0.35–5.50)

## 2022-02-24 LAB — VITAMIN B12: Vitamin B-12: 309 pg/mL (ref 211–911)

## 2022-02-25 LAB — URINALYSIS, ROUTINE W REFLEX MICROSCOPIC
Bilirubin Urine: NEGATIVE
Glucose, UA: NEGATIVE
Hgb urine dipstick: NEGATIVE
Ketones, ur: NEGATIVE
Leukocytes,Ua: NEGATIVE
Nitrite: NEGATIVE
Protein, ur: NEGATIVE
Specific Gravity, Urine: 1.024 (ref 1.001–1.035)
pH: 6 (ref 5.0–8.0)

## 2022-03-25 ENCOUNTER — Encounter: Payer: Self-pay | Admitting: Internal Medicine

## 2022-03-28 ENCOUNTER — Other Ambulatory Visit: Payer: Self-pay | Admitting: Internal Medicine

## 2022-03-28 DIAGNOSIS — E785 Hyperlipidemia, unspecified: Secondary | ICD-10-CM

## 2022-03-28 DIAGNOSIS — E559 Vitamin D deficiency, unspecified: Secondary | ICD-10-CM

## 2022-04-06 ENCOUNTER — Emergency Department
Admission: EM | Admit: 2022-04-06 | Discharge: 2022-04-06 | Disposition: A | Discharge status: Home / Self Care | Payer: PRIVATE HEALTH INSURANCE | Attending: Emergency Medicine | Admitting: Emergency Medicine

## 2022-04-06 ENCOUNTER — Emergency Department: Payer: PRIVATE HEALTH INSURANCE

## 2022-04-06 ENCOUNTER — Encounter: Payer: Self-pay | Admitting: Emergency Medicine

## 2022-04-06 ENCOUNTER — Other Ambulatory Visit: Payer: Self-pay

## 2022-04-06 DIAGNOSIS — N132 Hydronephrosis with renal and ureteral calculous obstruction: Secondary | ICD-10-CM | POA: Insufficient documentation

## 2022-04-06 DIAGNOSIS — F419 Anxiety disorder, unspecified: Secondary | ICD-10-CM | POA: Insufficient documentation

## 2022-04-06 DIAGNOSIS — R109 Unspecified abdominal pain: Secondary | ICD-10-CM | POA: Diagnosis present

## 2022-04-06 DIAGNOSIS — N200 Calculus of kidney: Secondary | ICD-10-CM

## 2022-04-06 LAB — CBC WITH DIFFERENTIAL/PLATELET
Abs Immature Granulocytes: 0.01 10*3/uL (ref 0.00–0.07)
Basophils Absolute: 0 10*3/uL (ref 0.0–0.1)
Basophils Relative: 0 %
Eosinophils Absolute: 0.1 10*3/uL (ref 0.0–0.5)
Eosinophils Relative: 1 %
HCT: 46.5 % (ref 39.0–52.0)
Hemoglobin: 15.7 g/dL (ref 13.0–17.0)
Immature Granulocytes: 0 %
Lymphocytes Relative: 36 %
Lymphs Abs: 2.2 10*3/uL (ref 0.7–4.0)
MCH: 30.2 pg (ref 26.0–34.0)
MCHC: 33.8 g/dL (ref 30.0–36.0)
MCV: 89.4 fL (ref 80.0–100.0)
Monocytes Absolute: 0.4 10*3/uL (ref 0.1–1.0)
Monocytes Relative: 7 %
Neutro Abs: 3.4 10*3/uL (ref 1.7–7.7)
Neutrophils Relative %: 56 %
Platelets: 225 10*3/uL (ref 150–400)
RBC: 5.2 MIL/uL (ref 4.22–5.81)
RDW: 12.3 % (ref 11.5–15.5)
WBC: 6.1 10*3/uL (ref 4.0–10.5)
nRBC: 0 % (ref 0.0–0.2)

## 2022-04-06 LAB — COMPREHENSIVE METABOLIC PANEL
ALT: 21 U/L (ref 0–44)
AST: 20 U/L (ref 15–41)
Albumin: 4.3 g/dL (ref 3.5–5.0)
Alkaline Phosphatase: 79 U/L (ref 38–126)
Anion gap: 9 (ref 5–15)
BUN: 15 mg/dL (ref 6–20)
CO2: 27 mmol/L (ref 22–32)
Calcium: 9.6 mg/dL (ref 8.9–10.3)
Chloride: 106 mmol/L (ref 98–111)
Creatinine, Ser: 0.85 mg/dL (ref 0.61–1.24)
GFR, Estimated: >60 mL/min (ref >60)
Glucose, Bld: 95 mg/dL (ref 70–99)
Potassium: 3.7 mmol/L (ref 3.5–5.1)
Sodium: 142 mmol/L (ref 135–145)
Total Bilirubin: 0.9 mg/dL (ref 0.3–1.2)
Total Protein: 7.6 g/dL (ref 6.5–8.1)

## 2022-04-06 LAB — URINALYSIS, ROUTINE W REFLEX MICROSCOPIC
Bilirubin Urine: NEGATIVE
Glucose, UA: NEGATIVE mg/dL
Ketones, ur: 20 mg/dL — AB
Leukocytes,Ua: NEGATIVE
Nitrite: NEGATIVE
Protein, ur: 30 mg/dL — AB
Specific Gravity, Urine: 1.025 (ref 1.005–1.030)
pH: 8 (ref 5.0–8.0)

## 2022-04-06 MED ORDER — OXYCODONE HCL 5 MG PO TABS: 5.0000 mg | ORAL_TABLET | ORAL | 0 refills | Status: AC | PRN

## 2022-04-06 MED ORDER — HYDROMORPHONE HCL 1 MG/ML IJ SOLN
0.5000 mg | Freq: Once | INTRAMUSCULAR | Status: AC
  Administered 2022-04-06: 0.5 mg via INTRAVENOUS
  Filled 2022-04-06: qty 0.5

## 2022-04-06 MED ORDER — LIDOCAINE 5 % EX PTCH
1.0000 | MEDICATED_PATCH | CUTANEOUS | Status: DC
  Administered 2022-04-06: 1 via TRANSDERMAL
  Filled 2022-04-06: qty 1

## 2022-04-06 MED ORDER — TAMSULOSIN HCL 0.4 MG PO CAPS
0.4000 mg | ORAL_CAPSULE | Freq: Every day | ORAL | 0 refills | Status: AC
Start: 2022-04-06 — End: 2022-04-20

## 2022-04-06 MED ORDER — ONDANSETRON 4 MG PO TBDP: 4.0000 mg | ORAL_TABLET | Freq: Three times a day (TID) | ORAL | 0 refills | Status: AC | PRN

## 2022-04-06 MED ORDER — ACETAMINOPHEN 500 MG PO TABS
1000.0000 mg | ORAL_TABLET | Freq: Once | ORAL | Status: AC
Start: 2022-04-06 — End: 2022-04-06
  Administered 2022-04-06: 1000 mg via ORAL
  Filled 2022-04-06: qty 2

## 2022-04-06 MED ORDER — ONDANSETRON HCL 4 MG/2ML IJ SOLN
4.0000 mg | Freq: Once | INTRAMUSCULAR | Status: AC
Start: 2022-04-06 — End: 2022-04-06
  Administered 2022-04-06: 4 mg via INTRAVENOUS
  Filled 2022-04-06: qty 2

## 2022-04-06 MED ORDER — OXYCODONE HCL 5 MG PO TABS
5.0000 mg | ORAL_TABLET | Freq: Once | ORAL | Status: AC
  Administered 2022-04-06: 5 mg via ORAL
  Filled 2022-04-06: qty 1

## 2022-04-06 MED ORDER — KETOROLAC TROMETHAMINE 15 MG/ML IJ SOLN
15.0000 mg | Freq: Once | INTRAMUSCULAR | Status: AC
  Administered 2022-04-06: 15 mg via INTRAVENOUS
  Filled 2022-04-06: qty 1

## 2022-04-06 MED ORDER — SODIUM CHLORIDE 0.9 % IV BOLUS
1000.0000 mL | Freq: Once | INTRAVENOUS | Status: AC
  Administered 2022-04-06: 1000 mL via INTRAVENOUS

## 2022-04-06 NOTE — ED Notes (Signed)
Pt appears very uncomfortable. IV started fluids started, nausea and pain meds given. Pt told UA is need, urinal provided. Pt is laying on side continuing to move

## 2022-04-06 NOTE — ED Provider Notes (Signed)
[facility] Provider Note    Event Date/Time   First MD Initiated Contact with Patient 04/06/22 [phone]     (approximate)   History   Flank Pain   HPI  Adam Bishop is a 36 y.o. male with some anxiety who comes in with flank pain.  Patient reports having some right-sided flank pain has been going off and on for the past 3 days.  He states the pain is radiated down into his groin but not into his testicles or penis.  He does report a little bit of issues with getting his urine out. Denies having this previously. No blood in urine. Denies any fevers   I reviewed patient's office visit note from 11/04/2021 where patient was seen for some anxiety.      Physical Exam   Triage Vital Signs: ED Triage Vitals  Enc Vitals Group     BP 04/06/22 0627 (!) 144/113     Pulse Rate 04/06/22 0627 87     Resp 04/06/22 0627 18     Temp 04/06/22 0627 97.8 F (36.6 C)     Temp Source 04/06/22 0627 Oral     SpO2 04/06/22 0627 99 %     Weight 04/06/22 0624 190 lb (86.2 kg)     Height 04/06/22 0624 6\' 3"  (1.905 m)     Head Circumference --      Peak Flow --      Pain Score 04/06/22 0625 7     Pain Loc --      Pain Edu? --      Excl. in GC? --     Most recent vital signs: Vitals:   04/06/22 0627 04/06/22 0635  BP: (!) 144/113   Pulse: 87   Resp: 18   Temp: 97.8 F (36.6 C)   SpO2: 99% 98%     General: Awake, no distress.  CV:  Good peripheral perfusion.  Resp:  Normal effort.  Abd:  No distention. Soft and non tender  Other:  R CVA tenderness    ED Results / Procedures / Treatments   Labs (all labs ordered are listed, but only abnormal results are displayed) Labs Reviewed  CBC WITH DIFFERENTIAL/PLATELET  URINALYSIS, ROUTINE W REFLEX MICROSCOPIC  COMPREHENSIVE METABOLIC PANEL     RADIOLOGY I have reviewed the  CT personally and interpreted and there is evidence of kidney stone   PROCEDURES:  Critical Care performed:  No  Procedures   MEDICATIONS ORDERED IN ED: Medications  ketorolac (TORADOL) 15 MG/ML injection 15 mg (15 mg Intravenous Given 04/06/22 0647)  ondansetron (ZOFRAN) injection 4 mg (4 mg Intravenous Given 04/06/22 0647)  sodium chloride 0.9 % bolus 1,000 mL (1,000 mLs Intravenous New Bag/Given 04/06/22 0648)     IMPRESSION / MDM / ASSESSMENT AND PLAN / ED COURSE  I reviewed the triage vital signs and the nursing notes.   Patient's presentation is most consistent with acute presentation with potential threat to life or bodily function.   Differential includes appendicitis, kidney stone, UTI, pyelonephritis.  Will get CT imaging and urine to further evaluate.  Patient given some IV Toradol IV fluid IV Zofran  Patient initially given Tylenol and lidocaine patch due to him stating that he was driving home but then states that he is still having a lot of discomfort and can have his wife come pick him up.  Patient still having discomfort so given some IV Dilaudid and oxycodone  CBC normal CMP normal UA with  a little bit of ketones but he is already got some fluid and some RBCs but no evidence of UTI    IMPRESSION: Mild right hydronephrosis with a 4 mm obstructing stone at the right ureterovesicular junction.    Discussed with patient CT results and return to the ER if he develops any fevers inability to tolerate p.o. or severe pain otherwise patient can follow-up with urology.  Patient expressed understanding felt comfortable with discharge home.  Understands not to drive on the oxycodone  Considered admission but given patient is afebrile safe for dc home.    FINAL CLINICAL IMPRESSION(S) / ED DIAGNOSES   Final diagnoses:  Kidney stone     Rx / DC Orders   ED Discharge Orders          Ordered    ondansetron (ZOFRAN-ODT) 4 MG disintegrating tablet  Every 8 hours PRN        04/06/22 0956    oxyCODONE (ROXICODONE) 5 MG immediate release tablet  Every 4 hours PRN         04/06/22 0956    tamsulosin (FLOMAX) 0.4 MG CAPS capsule  Daily        04/06/22 0956             Note:  This document was prepared using Dragon voice recognition software and may include unintentional dictation errors.   Concha Se, MD 04/06/22 [phone]

## 2022-04-06 NOTE — ED Triage Notes (Signed)
Patient ambulatory to triage with steady gait, without difficulty, appears uncomfortable; reports rt flank pain, nonradiating accomp by nausea; denies hx of same

## 2022-04-06 NOTE — Discharge Instructions (Signed)
You have a kidney stone. See report below.   Take ibuprofen 600mg  every 8 hours daily (as long as you are not on any other blood thinners or have kidney disease) Take tylenol 1g every 8 hours daily. Take oxycodone for breakthrough pain. Do not drive, work, or operate machinery while on this.  Take zofran to help with nausea. Take Flomax to help dilate uretha. Call urology number above to schedule outpatient appointment. Return to ED for fevers, unable to keep food down, or any other concerns.     IMPRESSION: Mild right hydronephrosis with a 4 mm obstructing stone at the right ureterovesicular junction.    Take oxycodone as prescribed. Do not drink alcohol, drive or participate in any other potentially dangerous activities while taking this medication as it may make you sleepy. Do not take this medication with any other sedating medications, either prescription or over-the-counter. If you were prescribed Percocet or Vicodin, do not take these with acetaminophen (Tylenol) as it is already contained within these medications.  This medication is an opiate (or narcotic) pain medication and can be habit forming. Use it as little as possible to achieve adequate pain control. Do not use or use it with extreme caution if you have a history of opiate abuse or dependence. If you are on a pain contract with your primary care doctor or a pain specialist, be sure to let them know you were prescribed this medication today from the Emergency Department. This medication is intended for your use only - do not give any to anyone else and keep it in a secure place where nobody else, especially children, have access to it.

## 2022-04-16 ENCOUNTER — Ambulatory Visit: Payer: Commercial Managed Care - PPO | Admitting: Urology

## 2022-04-28 ENCOUNTER — Encounter: Payer: Self-pay | Admitting: Internal Medicine

## 2022-05-10 ENCOUNTER — Telehealth: Payer: Self-pay

## 2022-05-10 ENCOUNTER — Encounter: Payer: Self-pay | Admitting: Internal Medicine

## 2022-05-10 DIAGNOSIS — I708 Atherosclerosis of other arteries: Secondary | ICD-10-CM | POA: Insufficient documentation

## 2022-05-10 DIAGNOSIS — N200 Calculus of kidney: Secondary | ICD-10-CM | POA: Insufficient documentation

## 2022-05-10 NOTE — Addendum Note (Signed)
Addended by: Quentin Ore on: 05/10/2022 10:45 PM   Modules accepted: Orders

## 2022-05-10 NOTE — Telephone Encounter (Signed)
Patient states he sent a note saying he is interested in having a referral to a cardiologist, but he hasn't heard from anyone.  Please call.

## 2022-06-04 ENCOUNTER — Telehealth (INDEPENDENT_AMBULATORY_CARE_PROVIDER_SITE_OTHER): Payer: Self-pay

## 2022-06-04 NOTE — Telephone Encounter (Signed)
Pt LVM yesterday advising that he was having lots of pain and wanted toknow if he could move up his appt. I LVM for pt this morning stating that we did not have any appts sooner available. I did ask for a call back to discuss.

## 2022-06-29 ENCOUNTER — Other Ambulatory Visit (INDEPENDENT_AMBULATORY_CARE_PROVIDER_SITE_OTHER): Payer: Self-pay | Admitting: Nurse Practitioner

## 2022-06-29 DIAGNOSIS — I708 Atherosclerosis of other arteries: Secondary | ICD-10-CM

## 2022-06-30 ENCOUNTER — Encounter: Payer: PRIVATE HEALTH INSURANCE | Admitting: Family Medicine

## 2022-06-30 NOTE — Progress Notes (Signed)
MRN : [id number]  Adam Bishop is a 36 y.o. (Oct 19, 1985) male who presents with chief complaint of check circulation.  History of Present Illness:    The patient is seen for evaluation of painful lower extremities and diminished pulses.  Patient was seen in the ER in April 08, 2022 for flank pain radiating into the groin area.  Noncontrasted CT dated April 08, 2022 was consistent with a renal stone.  Incidental finding was of small plaque formation noted in the common iliac arteries.  His flank pain has resolved.  The patient denies rest pain or dangling of an extremity off the side of the bed during the night for relief. No open wounds or sores at this time. No prior interventions or surgeries.  No history of back problems or DJD of the lumbar sacral spine.   The patient's blood pressure has been stable and relatively well controlled. The patient denies amaurosis fugax or recent TIA symptoms. There are no recent neurological changes noted. The patient denies history of DVT, PE or superficial thrombophlebitis. The patient denies recent episodes of angina or shortness of breath.  I have personally reviewed the CT scan it does show some very minor calcified plaque formation in the common iliac arteries slightly more so on the right than the left.  What was not noted in the original report is that his common iliac arteries are ectatic/aneurysmal.  The right measures 12.5 mm and the left measures approximately 11 mm.  No outpatient medications have been marked as taking for the 07/01/22 encounter (Appointment) with Delana Meyer, Dolores Lory, MD.    Past Medical History:  Diagnosis Date   Anxiety    Atherosclerosis of both iliac arteries    Colon polyp    Benign, colonoscopy 2015   COVID-19    06/24/20, 04/2021, 09/2021   GERD (gastroesophageal reflux disease)    Globus sensation    History of chicken pox    IBS (irritable bowel syndrome)    constipation   Lymph node  enlargement    Right kidney stone    Wears contact lenses     Past Surgical History:  Procedure Laterality Date   COLONOSCOPY WITH ESOPHAGOGASTRODUODENOSCOPY (EGD)     ESOPHAGOGASTRODUODENOSCOPY (EGD) WITH PROPOFOL N/A 10/21/2017   Procedure: ESOPHAGOGASTRODUODENOSCOPY (EGD) WITH PROPOFOL;  Surgeon: Lucilla Lame, MD;  Location: [facility];  Service: Endoscopy;  Laterality: N/A;   FINGER SURGERY     fracture   FLEXIBLE BRONCHOSCOPY N/A 05/29/2018   Procedure: FLEXIBLE BRONCHOSCOPY;  Surgeon: Laverle Hobby, MD;  Location: [facility];  Service: Pulmonary;  Laterality: N/A;   TONSILLECTOMY     VASECTOMY     09/2021   WISDOM TOOTH EXTRACTION      Social History Social History   Tobacco Use   Smoking status: Never   Smokeless tobacco: Never  Vaping Use   Vaping Use: Never used  Substance Use Topics   Alcohol use: Yes    Alcohol/week: 0.0 standard drinks of alcohol    Comment: rare   Drug use: No    Family History Family History  Problem Relation Age of Onset   Hypertension Mother    Thyroid disease Mother        Living   Goiter Mother    Cirrhosis Father 50       Deceased   Alcohol abuse Father        liver cirrhosis  Lymphoma Sister        Neck   Melanoma Sister    Skin cancer Sister    Hypertension Maternal Grandmother    Cancer Maternal Grandfather    Cancer Paternal Grandfather        Lung Cancer   Obesity Other     No Known Allergies   REVIEW OF SYSTEMS (Negative unless checked)  Constitutional: _0 Weight loss  _1 Fever  _2 Chills Cardiac: _3 Chest pain   _4 Chest pressure   _5 Palpitations   _6 Shortness of breath when laying flat   _7 Shortness of breath with exertion. Vascular:  _8 Pain in legs with walking   _9 Pain in legs at rest  _10 History of DVT   _11 Phlebitis   _12 Swelling in legs   _13 Varicose veins   _14 Non-healing ulcers Pulmonary:   _15 Uses home oxygen   _16 Productive cough   _17 Hemoptysis   _18 Wheeze  _19 COPD   _20 Asthma Neurologic:   _21 Dizziness   _22 Seizures   _23 History of stroke   _24 History of TIA  _25 Aphasia   _26 Vissual changes   _27 Weakness or numbness in arm   _28 Weakness or numbness in leg Musculoskeletal:   _29 Joint swelling   _30 Joint pain   _31 Low back pain Hematologic:  _32 Easy bruising  _33 Easy bleeding   _34 Hypercoagulable state   _35 Anemic Gastrointestinal:  _36 Diarrhea   _37 Vomiting  _38 Gastroesophageal reflux/heartburn   _39 Difficulty swallowing. Genitourinary:  _40 Chronic kidney disease   _41 Difficult urination  _42 Frequent urination   _43 Blood in urine Skin:  _44 Rashes   _45 Ulcers  Psychological:  _46 History of anxiety   _47  History of major depression.  Physical Examination  There were no vitals filed for this visit. There is no height or weight on file to calculate BMI. Gen: WD/WN, NAD Head: [place]/AT, No temporalis wasting.  Ear/Nose/Throat: Hearing grossly intact, nares w/o erythema or drainage Eyes: PER, EOMI, sclera nonicteric.  Neck: Supple, no masses.  No bruit or JVD.  Pulmonary:  Good air movement, no audible wheezing, no use of accessory muscles.  Cardiac: RRR, normal S1, S2, no Murmurs. Vascular:  mild trophic changes, no open wounds Vessel Right Left  Radial Palpable Palpable  PT Palpable Palpable  DP Palpable Palpable  Gastrointestinal: soft, non-distended. No guarding/no peritoneal signs.  Musculoskeletal: M/S 5/5 throughout.  No visible deformity.  Neurologic: CN 2-12 intact. Pain and light touch intact in extremities.  Symmetrical.  Speech is fluent. Motor exam as listed above. Psychiatric: Judgment intact, Mood & affect appropriate for pt's clinical situation. Dermatologic: No rashes or ulcers noted.  No changes consistent with cellulitis.   CBC Lab Results  Component Value Date   WBC 6.1 04/06/2022   HGB 15.7 04/06/2022   HCT 46.5 04/06/2022   MCV 89.4 04/06/2022   PLT 225 04/06/2022    BMET    Component Value Date/Time   NA 142 04/06/2022 0644   NA 137 02/04/2014 1332   K 3.7 04/06/2022  0644   K 4.2 02/04/2014 1332   CL 106 04/06/2022 0644   CL 103 02/04/2014 1332   CO2 27 04/06/2022 0644   CO2 32 02/04/2014 1332   GLUCOSE 95 04/06/2022 0644   GLUCOSE 67 02/04/2014 1332   BUN 15 04/06/2022 0644   BUN 20 (H) 02/04/2014 1332   CREATININE 0.85 04/06/2022 0644   CREATININE 0.83 02/04/2014 1332   CALCIUM 9.6 04/06/2022 0644   CALCIUM 9.3 02/04/2014 1332   GFRNONAA >60 04/06/2022 0644   GFRNONAA >60 02/04/2014 1332   GFRAA >60 02/04/2014 1332   CrCl cannot be calculated (Patient's most  recent lab result is older than the maximum 21 days allowed.).  COAG No results found for: "INR", "PROTIME"  Radiology No results found.   Assessment/Plan 1. Iliac artery aneurysm (HCC) Recommend: No surgery or intervention is indicated at this time.  The patient has an asymptomatic iliac artery ectasia that is less than 2.5 cm in maximal diameter.    I have reviewed the natural history of iliac artery aneurysm and the small risk of rupture for aneurysm less than 2.5 cm in size.  However, as these small aneurysms tend to enlarge over time, continued surveillance with ultrasound or CT scan is mandatory.   I have also discussed optimizing medical management with hypertension and lipid control.  The patient is also encouraged to exercise a minimum of 30 minutes 4 times a week.   Should the patient develop new onset abdominal or back pain or signs of peripheral embolization they are instructed to seek medical attention immediately and to alert the physician providing care that they have an aneurysm.   The patient voices their understanding.  The patient will return in 12 months with an aortic iliac duplex.  - VAS US AORTA/IVC/ILIACS; Future  2. Atherosclerosis of both iliac arteries Recommend:  I do not find evidence of life style limiting vascular disease. The patient specifically denies life style limitation.  Previous noninvasive studies including ABI's of the legs do not  identify critical vascular problems.  The patient should continue walking and begin a more formal exercise program. The patient should continue his antiplatelet therapy and aggressive treatment of the lipid abnormalities.  The patient is instructed to call the office if there is a significant change in the lower extremity symptoms, particularly if a wound develops or there is an abrupt increase in leg pain.   3. Gastroesophageal reflux disease, unspecified whether esophagitis present Continue PPI as already ordered, this medication has been reviewed and there are no changes at this time.  Avoidence of caffeine and alcohol  Moderate elevation of the head of the bed    4. DDD (degenerative disc disease), lumbosacral Continue NSAID medications as already ordered, these medications have been reviewed and there are no changes at this time.  Continued activity and therapy was stressed.   5. Hyperlipidemia, unspecified hyperlipidemia type Continue statin as ordered and reviewed, no changes at this time     Hortencia Pilar, MD  06/30/2022 10:08 AM

## 2022-07-01 ENCOUNTER — Ambulatory Visit (INDEPENDENT_AMBULATORY_CARE_PROVIDER_SITE_OTHER): Payer: Self-pay

## 2022-07-01 ENCOUNTER — Ambulatory Visit (INDEPENDENT_AMBULATORY_CARE_PROVIDER_SITE_OTHER): Payer: No Typology Code available for payment source | Admitting: Vascular Surgery

## 2022-07-01 ENCOUNTER — Encounter (INDEPENDENT_AMBULATORY_CARE_PROVIDER_SITE_OTHER): Payer: Self-pay | Admitting: Vascular Surgery

## 2022-07-01 VITALS — BP 138/78 | HR 67 | Resp 16 | Ht 75.0 in | Wt 181.4 lb

## 2022-07-01 DIAGNOSIS — I708 Atherosclerosis of other arteries: Secondary | ICD-10-CM

## 2022-07-01 DIAGNOSIS — K219 Gastro-esophageal reflux disease without esophagitis: Secondary | ICD-10-CM

## 2022-07-01 DIAGNOSIS — E785 Hyperlipidemia, unspecified: Secondary | ICD-10-CM

## 2022-07-01 DIAGNOSIS — M5137 Other intervertebral disc degeneration, lumbosacral region: Secondary | ICD-10-CM | POA: Diagnosis not present

## 2022-07-01 DIAGNOSIS — I723 Aneurysm of iliac artery: Secondary | ICD-10-CM | POA: Diagnosis not present

## 2022-08-16 ENCOUNTER — Encounter (INDEPENDENT_AMBULATORY_CARE_PROVIDER_SITE_OTHER): Payer: Self-pay

## 2022-09-07 ENCOUNTER — Telehealth (INDEPENDENT_AMBULATORY_CARE_PROVIDER_SITE_OTHER): Payer: Self-pay

## 2022-09-07 NOTE — Telephone Encounter (Signed)
Pt states that he is concerned about the results of his last appt with Dr Gilda Crease.  He states that he did not think it was very serious but looking at his my chart has him concerned.  Should pt be seen sooner than a year and what can pt be doing to help prevent aneurysms? Please advise

## 2022-09-07 NOTE — Telephone Encounter (Signed)
Based upon the patient's iliac artery aneurysms they are very small and typically at this size we would only follow once per year.  This is because aneurysms are very slow-growing and at this size they pose very little risk.  I am sure that with your discussion with Dr. Gilda Crease, it was discussed that we typically do not repair until approximately 3 cm.  This is the guideline directed medical therapy for iliac artery aneurysms.  As far as aneurysm prevention, the largest thing that you can do is keeping your blood pressure under control.  Typically your top number should remain at 140 or less.  We recommend that you work with your primary care provider to keep this managed.  Also, if you do not smoke I would recommend that you do not start.  Typically smoking in blood pressure the 2 largest things that cause enlargement of an iliac artery aneurysm.

## 2022-09-08 ENCOUNTER — Ambulatory Visit: Payer: No Typology Code available for payment source | Admitting: Family Medicine

## 2022-09-08 ENCOUNTER — Encounter: Payer: Self-pay | Admitting: Family Medicine

## 2022-09-08 VITALS — BP 130/78 | HR 75 | Temp 98.9°F | Ht 72.0 in | Wt 182.0 lb

## 2022-09-08 DIAGNOSIS — S39011A Strain of muscle, fascia and tendon of abdomen, initial encounter: Secondary | ICD-10-CM

## 2022-09-08 MED ORDER — MELOXICAM 15 MG PO TABS: 15.0000 mg | ORAL_TABLET | Freq: Every day | ORAL | 0 refills | Status: DC

## 2022-09-08 NOTE — Patient Instructions (Signed)
Nice to see you. Please take the next couple weeks off from lifting weights.  You can take the meloxicam once daily for 5 days and then once daily as needed after that.  Please take this with food.

## 2022-09-08 NOTE — Progress Notes (Signed)
  Marikay Alar, MD Phone: [phone]  Adam Bishop is a 36 y.o. male who presents today for same-day visit.  Right leg pain: Patient notes this has been going on a couple of weeks.  It hurts on the inner aspect of his right thigh down to his knee from his groin.  Notes a dull ache near his groin that is fairly constant and at times radiates down to his knee.  He notes no back pain.  No numbness.  No tingling.  No bulges in his groin.  He notes the pain is not waking him at night.  He notes no specific injuries.  He has been walking more and lifting weights with his upper body in an attempt to lose weight.  The heaviest weight he lifts his 45 pound dumbbell that is on the floor.  Social History   Tobacco Use  Smoking Status Never  Smokeless Tobacco Never    Current Outpatient Medications on File Prior to Visit  Medication Sig Dispense Refill   FLUoxetine (PROZAC) 10 MG tablet Take 1 tablet (10 mg total) by mouth daily. Can increase to 20 mg (2 pills) in 2 weeks if needed (Patient not taking: Reported on 07/01/2022) 90 tablet 3   No current facility-administered medications on file prior to visit.     ROS see history of present illness  Objective  Physical Exam Vitals:   09/08/22 1147  BP: 130/78  Pulse: 75  Temp: 98.9 F (37.2 C)  SpO2: 99%    BP Readings from Last 3 Encounters:  09/08/22 130/78  07/01/22 138/78  04/06/22 (!) 143/85   Wt Readings from Last 3 Encounters:  09/08/22 182 lb (82.6 kg)  07/01/22 181 lb 6.4 oz (82.3 kg)  04/06/22 190 lb (86.2 kg)    Physical Exam Genitourinary:    Comments: No inguinal hernias bilaterally, bilateral testicles without palpated lesions Musculoskeletal:     Comments: Right thigh with no swelling or skin changes, no tenderness over the medial right thigh or into his groin, no bulges in his groin, patient has good range of motion bilateral hips, no discomfort on resisted flexion of bilateral hips, no tenderness of his  knees bilaterally      Assessment/Plan: Please see individual problem list.  Problem List Items Addressed This Visit     Strain of muscle of right groin region - Primary    Suspect muscular strain.  He will rest from lifting weights for the next 2 weeks.  We will treat with meloxicam once daily for 5 days with food and then once daily as needed.  If not improving some over the next couple of weeks he will let me know and we could refer to sports medicine.      Relevant Medications   meloxicam (MOBIC) 15 MG tablet     Return in about 1 week (around 09/15/2022) for Fasting labs in 1 week, transfer of care visit can be with whomever has availability whenever.   Marikay Alar, MD [facility]

## 2022-09-08 NOTE — Assessment & Plan Note (Signed)
Suspect muscular strain.  He will rest from lifting weights for the next 2 weeks.  We will treat with meloxicam once daily for 5 days with food and then once daily as needed.  If not improving some over the next couple of weeks he will let me know and we could refer to sports medicine.

## 2022-09-08 NOTE — Telephone Encounter (Signed)
Pt states understanding and states that he feels better and has a better understanding now.

## 2022-09-15 ENCOUNTER — Other Ambulatory Visit (INDEPENDENT_AMBULATORY_CARE_PROVIDER_SITE_OTHER): Payer: No Typology Code available for payment source

## 2022-09-15 DIAGNOSIS — E559 Vitamin D deficiency, unspecified: Secondary | ICD-10-CM

## 2022-09-15 DIAGNOSIS — E785 Hyperlipidemia, unspecified: Secondary | ICD-10-CM | POA: Diagnosis not present

## 2022-09-15 LAB — LIPID PANEL
Cholesterol: 168 mg/dL (ref 0–200)
HDL: 37.4 mg/dL — ABNORMAL LOW (ref >39.00)
LDL Cholesterol: 121 mg/dL — ABNORMAL HIGH (ref 0–99)
NonHDL: 130.35
Total CHOL/HDL Ratio: 4
Triglycerides: 47 mg/dL (ref 0.0–149.0)
VLDL: 9.4 mg/dL (ref 0.0–40.0)

## 2022-09-15 LAB — VITAMIN D 25 HYDROXY (VIT D DEFICIENCY, FRACTURES): VITD: 40.55 ng/mL (ref 30.00–100.00)

## 2022-10-05 ENCOUNTER — Other Ambulatory Visit: Payer: Self-pay | Admitting: Family Medicine

## 2022-10-05 DIAGNOSIS — S39011A Strain of muscle, fascia and tendon of abdomen, initial encounter: Secondary | ICD-10-CM

## 2022-10-05 NOTE — Telephone Encounter (Signed)
Looks like filled for acute reason Groin strain on 09/08/22, please advise to refill.

## 2022-10-06 NOTE — Telephone Encounter (Signed)
Please contact the patient and see if he actually needs a refill on this. Has he been taking this daily?

## 2022-10-12 ENCOUNTER — Telehealth: Payer: Self-pay

## 2022-10-12 NOTE — Telephone Encounter (Signed)
LMOM to CB in regards to his refill request for [facility] Dr. De Nurse wanted to know if he really needed a refill and if he was taking it daily.

## 2023-01-12 ENCOUNTER — Encounter: Payer: No Typology Code available for payment source | Admitting: Family Medicine

## 2023-02-09 ENCOUNTER — Encounter: Payer: Self-pay | Admitting: Family Medicine

## 2023-02-09 ENCOUNTER — Ambulatory Visit: Payer: Commercial Managed Care - PPO | Admitting: Family Medicine

## 2023-02-09 VITALS — BP 120/68 | HR 77 | Temp 98.0°F | Ht 72.0 in | Wt 185.6 lb

## 2023-02-09 DIAGNOSIS — E785 Hyperlipidemia, unspecified: Secondary | ICD-10-CM

## 2023-02-09 DIAGNOSIS — I708 Atherosclerosis of other arteries: Secondary | ICD-10-CM

## 2023-02-09 DIAGNOSIS — I10 Essential (primary) hypertension: Secondary | ICD-10-CM

## 2023-02-09 DIAGNOSIS — E559 Vitamin D deficiency, unspecified: Secondary | ICD-10-CM | POA: Diagnosis not present

## 2023-02-09 DIAGNOSIS — I723 Aneurysm of iliac artery: Secondary | ICD-10-CM

## 2023-02-09 DIAGNOSIS — E041 Nontoxic single thyroid nodule: Secondary | ICD-10-CM

## 2023-02-09 NOTE — Patient Instructions (Signed)
It was a pleasure meeting you today. Thank you for allowing me to take part in your health care.  Our goals for today as we discussed include:  Recommend Tetanus Vaccination.  This is given every 10 years.   We will get some labs today.  If they are abnormal or we need to do something about them, I will call you.  If they are normal, I will send you a message on MyChart (if it is active) or a letter in the mail.  If you don't hear from [place] in 2 weeks, please call the office at the number below.    If you have any questions or concerns, please do not hesitate to call the office at [phone].  I look forward to our next visit and until then take care and stay safe.  Regards,   Dana Allan, MD   [facility]

## 2023-02-09 NOTE — Progress Notes (Signed)
SUBJECTIVE:   Chief Complaint  Patient presents with   Transitions Of Care   HPI Presents to clinic to transfer care.  No acute concerns today.  PAD No history of smoking.  Incidental finding of small plaque formation noted, iliac arteries noted on noncontrast CT 03/2022 during evaluation in ER for flank pain radiating to groin.  He was referred to vascular surgery who reviewed CT scan and noted that his common iliac arteries are ectatic/aneurysmal.  Right measuring 12.5 mm and left measuring 11 mm approximately.  Reports follow-up ultrasound scheduled later this year.  He is not on statin, antiplatelet therapy.  Blood pressure is well-controlled.  Mood disorder Primarily anxiety Previously tried Prozac, Paxil, Lexapro, Zoloft.  Discontinued secondary to side effects, made him feel like a zombie.  Has tried therapy  PERTINENT PMH / PSH: PAD Elevated blood pressure readings Hyperlipidemia Mood disorder   OBJECTIVE:  BP 120/68   Pulse 77   Temp 98 F (36.7 C)   Ht 6' (1.829 m)   Wt 185 lb 9.6 oz (84.2 kg)   SpO2 99%   BMI 25.17 kg/m    Physical Exam Vitals reviewed.  Constitutional:      General: He is not in acute distress.    Appearance: He is normal weight. He is not ill-appearing.  HENT:     Head: Normocephalic.  Eyes:     Conjunctiva/sclera: Conjunctivae normal.  Neck:     Thyroid: No thyromegaly or thyroid tenderness.  Cardiovascular:     Rate and Rhythm: Normal rate and regular rhythm.     Pulses: Normal pulses.  Pulmonary:     Effort: Pulmonary effort is normal.     Breath sounds: Normal breath sounds.  Abdominal:     General: Bowel sounds are normal.  Neurological:     Mental Status: He is alert. Mental status is at baseline.  Psychiatric:        Mood and Affect: Mood normal.        Behavior: Behavior normal.        Thought Content: Thought content normal.        Judgment: Judgment normal.       02/09/2023    2:01 PM 09/08/2022   11:48 AM  07/23/2021    9:34 AM 05/16/2020   11:45 AM 08/07/2019    8:19 AM  Depression screen PHQ 2/9  Decreased Interest 1 0 1 0 0  Down, Depressed, Hopeless 1 0 1 1 0  PHQ - 2 Score 2 0 2 1 0  Altered sleeping 0  1    Tired, decreased energy 1  1    Change in appetite 1  2    Feeling bad or failure about yourself  0  0    Trouble concentrating 0  0    Moving slowly or fidgety/restless 0  0    Suicidal thoughts 0  0    PHQ-9 Score 4  6    Difficult doing work/chores Not difficult at all  Somewhat difficult         02/09/2023    2:01 PM 05/16/2020   11:45 AM  GAD 7 : Generalized Anxiety Score  Nervous, Anxious, on Edge 2 3  Control/stop worrying 2 2  Worry too much - different things 2 3  Trouble relaxing 1 3  Restless 0 0  Easily annoyed or irritable 2 1  Afraid - awful might happen 2 3  Total GAD 7 Score 11 15  Anxiety  Difficulty Not difficult at all Somewhat difficult     ASSESSMENT/PLAN:  Atherosclerosis of both iliac arteries Assessment & Plan: Chronic.  Not currently on statin therapy Check fasting lipids   Orders: -     Lipid panel; Future  Vitamin D deficiency Assessment & Plan: Current.  Not currently on vitamin D supplements.  Check vitamin D levels  Orders: -     VITAMIN D 25 Hydroxy (Vit-D Deficiency, Fractures); Future  Hypertension, unspecified type Assessment & Plan: Chronic. History intermittent elevated blood pressure readings.  Goal less than 130/80 given PAD and iliac artery aneurysm. Not currently on anti-hypertensive medication and currently at goal. Continue to monitor blood pressure.    Orders: -     Comprehensive metabolic panel; Future -     CBC with Differential/Platelet; Future -     Vitamin B12; Future -     TSH; Future  Iliac artery aneurysm [facility]) Assessment & Plan: Chronic.  Asymptomatic.  Not currently on statin or antiplatelet therapy. No previous risk factors, smoking history Blood pressure well-controlled. Continue to follow  with vascular surgery   Thyroid nodule Assessment & Plan: Chronic.  Asymptomatic. Check TSH   Hyperlipidemia, unspecified hyperlipidemia type Assessment & Plan: Chronic. Check fasting lipids     HCM Colonoscopy 2018.  Negative.  Recommend screening at age 81 PSA at age 53 Hepatitis C/HIV screening completed   PDMP reviewed  Return if symptoms worsen or fail to improve.  Dana Allan, MD

## 2023-02-20 ENCOUNTER — Encounter: Payer: Self-pay | Admitting: Family Medicine

## 2023-02-20 DIAGNOSIS — I10 Essential (primary) hypertension: Secondary | ICD-10-CM | POA: Insufficient documentation

## 2023-02-20 HISTORY — DX: Essential (primary) hypertension: I10

## 2023-02-20 NOTE — Assessment & Plan Note (Signed)
Current.  Not currently on vitamin D supplements.  Check vitamin D levels

## 2023-02-20 NOTE — Assessment & Plan Note (Signed)
Chronic. History intermittent elevated blood pressure readings.  Goal less than 130/80 given PAD and iliac artery aneurysm. Not currently on anti-hypertensive medication and currently at goal. Continue to monitor blood pressure.

## 2023-02-20 NOTE — Assessment & Plan Note (Signed)
Chronic.  Asymptomatic. Check TSH

## 2023-02-20 NOTE — Assessment & Plan Note (Signed)
Chronic. Check fasting lipids 

## 2023-02-20 NOTE — Assessment & Plan Note (Signed)
Chronic.  Asymptomatic.  Not currently on statin or antiplatelet therapy. No previous risk factors, smoking history Blood pressure well-controlled. Continue to follow with vascular surgery

## 2023-02-20 NOTE — Assessment & Plan Note (Signed)
Chronic.  Not currently on statin therapy. Check fasting lipids 

## 2023-03-08 ENCOUNTER — Encounter: Payer: Self-pay | Admitting: Family Medicine

## 2023-03-09 ENCOUNTER — Other Ambulatory Visit: Payer: Self-pay | Admitting: Family Medicine

## 2023-03-09 DIAGNOSIS — R39198 Other difficulties with micturition: Secondary | ICD-10-CM

## 2023-04-06 ENCOUNTER — Other Ambulatory Visit (INDEPENDENT_AMBULATORY_CARE_PROVIDER_SITE_OTHER): Payer: Commercial Managed Care - PPO

## 2023-04-06 DIAGNOSIS — I10 Essential (primary) hypertension: Secondary | ICD-10-CM | POA: Diagnosis not present

## 2023-04-06 DIAGNOSIS — E559 Vitamin D deficiency, unspecified: Secondary | ICD-10-CM | POA: Diagnosis not present

## 2023-04-06 DIAGNOSIS — R39198 Other difficulties with micturition: Secondary | ICD-10-CM | POA: Diagnosis not present

## 2023-04-06 DIAGNOSIS — I708 Atherosclerosis of other arteries: Secondary | ICD-10-CM

## 2023-04-06 LAB — COMPREHENSIVE METABOLIC PANEL
ALT: 37 U/L (ref 0–53)
AST: 25 U/L (ref 0–37)
Albumin: 4.6 g/dL (ref 3.5–5.2)
Alkaline Phosphatase: 63 U/L (ref 39–117)
BUN: 15 mg/dL (ref 6–23)
CO2: 33 mEq/L — ABNORMAL HIGH (ref 19–32)
Calcium: 9.6 mg/dL (ref 8.4–10.5)
Chloride: 100 mEq/L (ref 96–112)
Creatinine, Ser: 0.86 mg/dL (ref 0.40–1.50)
GFR: 110.28 mL/min (ref >60.00)
Glucose, Bld: 93 mg/dL (ref 70–99)
Potassium: 4.5 mEq/L (ref 3.5–5.1)
Sodium: 139 mEq/L (ref 135–145)
Total Bilirubin: 1.3 mg/dL — ABNORMAL HIGH (ref 0.2–1.2)
Total Protein: 7.4 g/dL (ref 6.0–8.3)

## 2023-04-06 LAB — CBC WITH DIFFERENTIAL/PLATELET
Basophils Absolute: 0 10*3/uL (ref 0.0–0.1)
Basophils Relative: 0.3 % (ref 0.0–3.0)
Eosinophils Absolute: 0.1 10*3/uL (ref 0.0–0.7)
Eosinophils Relative: 2.4 % (ref 0.0–5.0)
HCT: 49 % (ref 39.0–52.0)
Hemoglobin: 16.4 g/dL (ref 13.0–17.0)
Lymphocytes Relative: 42.4 % (ref 12.0–46.0)
Lymphs Abs: 2.3 10*3/uL (ref 0.7–4.0)
MCHC: 33.4 g/dL (ref 30.0–36.0)
MCV: 91.3 fl (ref 78.0–100.0)
Monocytes Absolute: 0.5 10*3/uL (ref 0.1–1.0)
Monocytes Relative: 8.5 % (ref 3.0–12.0)
Neutro Abs: 2.5 10*3/uL (ref 1.4–7.7)
Neutrophils Relative %: 46.4 % (ref 43.0–77.0)
Platelets: 215 10*3/uL (ref 150.0–400.0)
RBC: 5.37 Mil/uL (ref 4.22–5.81)
RDW: 13.1 % (ref 11.5–15.5)
WBC: 5.4 10*3/uL (ref 4.0–10.5)

## 2023-04-06 LAB — LIPID PANEL
Cholesterol: 184 mg/dL (ref 0–200)
HDL: 42.8 mg/dL (ref >39.00)
LDL Cholesterol: 127 mg/dL — ABNORMAL HIGH (ref 0–99)
NonHDL: 141.57
Total CHOL/HDL Ratio: 4
Triglycerides: 71 mg/dL (ref 0.0–149.0)
VLDL: 14.2 mg/dL (ref 0.0–40.0)

## 2023-04-06 LAB — MICROALBUMIN / CREATININE URINE RATIO
Creatinine,U: 96.8 mg/dL
Microalb Creat Ratio: 0.7 mg/g (ref 0.0–30.0)
Microalb, Ur: <0.7 mg/dL (ref 0.0–1.9)

## 2023-04-06 LAB — VITAMIN B12: Vitamin B-12: 375 pg/mL (ref 211–911)

## 2023-04-06 LAB — PSA: PSA: 0.71 ng/mL (ref 0.10–4.00)

## 2023-04-06 LAB — VITAMIN D 25 HYDROXY (VIT D DEFICIENCY, FRACTURES): VITD: 29.57 ng/mL — ABNORMAL LOW (ref 30.00–100.00)

## 2023-04-06 LAB — TSH: TSH: 1.69 u[IU]/mL (ref 0.35–5.50)

## 2023-04-13 ENCOUNTER — Encounter: Payer: Self-pay | Admitting: Family Medicine

## 2023-04-15 ENCOUNTER — Other Ambulatory Visit: Payer: Self-pay | Admitting: Family Medicine

## 2023-04-15 DIAGNOSIS — E559 Vitamin D deficiency, unspecified: Secondary | ICD-10-CM

## 2023-04-15 MED ORDER — VITAMIN D (ERGOCALCIFEROL) 1.25 MG (50000 UNIT) PO CAPS: 50000.0000 [IU] | ORAL_CAPSULE | ORAL | 1 refills | Status: DC

## 2023-04-26 ENCOUNTER — Encounter: Payer: Self-pay | Admitting: Family Medicine

## 2023-05-26 ENCOUNTER — Ambulatory Visit: Payer: Commercial Managed Care - PPO | Admitting: Family Medicine

## 2023-05-26 ENCOUNTER — Encounter: Payer: Self-pay | Admitting: Family Medicine

## 2023-05-26 VITALS — BP 108/64 | HR 76 | Temp 98.0°F | Resp 16 | Ht 75.0 in | Wt 194.8 lb

## 2023-05-26 DIAGNOSIS — F39 Unspecified mood [affective] disorder: Secondary | ICD-10-CM | POA: Diagnosis not present

## 2023-05-26 MED ORDER — ESCITALOPRAM OXALATE 5 MG PO TABS: 5.0000 mg | ORAL_TABLET | Freq: Every day | ORAL | 0 refills | Status: DC

## 2023-05-26 NOTE — Progress Notes (Signed)
SUBJECTIVE:   Chief Complaint  Patient presents with   Stress   Anxiety   HPI Presents to clinic to discuss mood disorder  History of anxiety Previously tried Prozac, Paxil, Lexapro, Zoloft. Discontinued secondary to side effects, made him feel like a zombie. Has tried therapy in past. He reports that he was non compliant with medication in the past and felt he did not want to remain on medication.  He is extremely concerned about his health and reports that this is primarily the reason his anxiety level is high.  He reports that he feels he has hypochondria related to his health.  Wants to go back on medication.  PERTINENT PMH / PSH: Mood disorder  OBJECTIVE:  BP 108/64   Pulse 76   Temp 98 F (36.7 C)   Resp 16   Ht 6\' 3"  (1.905 m)   Wt 194 lb 12 oz (88.3 kg)   SpO2 98%   BMI 24.34 kg/m    Physical Exam Vitals reviewed.  Constitutional:      General: He is not in acute distress.    Appearance: Normal appearance. He is normal weight. He is not ill-appearing, toxic-appearing or diaphoretic.  Eyes:     General:        Right eye: No discharge.        Left eye: No discharge.  Cardiovascular:     Rate and Rhythm: Normal rate.  Pulmonary:     Effort: Pulmonary effort is normal.  Musculoskeletal:        General: Normal range of motion.     Cervical back: Normal range of motion.  Skin:    General: Skin is warm and dry.  Neurological:     Mental Status: He is alert and oriented to person, place, and time. Mental status is at baseline.  Psychiatric:        Attention and Perception: Attention normal.        Mood and Affect: Mood is anxious. Affect is flat.        Behavior: Behavior normal.        Thought Content: Thought content normal. Thought content does not include homicidal or suicidal ideation. Thought content does not include homicidal or suicidal plan.        Judgment: Judgment normal.        05/26/2023    8:09 AM 02/09/2023    2:01 PM 09/08/2022   11:48 AM  07/23/2021    9:34 AM 05/16/2020   11:45 AM  Depression screen PHQ 2/9  Decreased Interest 1 1 0 1 0  Down, Depressed, Hopeless 2 1 0 1 1  PHQ - 2 Score 3 2 0 2 1  Altered sleeping 1 0  1   Tired, decreased energy 0 1  1   Change in appetite 1 1  2    Feeling bad or failure about yourself  0 0  0   Trouble concentrating 0 0  0   Moving slowly or fidgety/restless 0 0  0   Suicidal thoughts 0 0  0   PHQ-9 Score 5 4  6    Difficult doing work/chores Somewhat difficult Not difficult at all  Somewhat difficult       05/26/2023    8:09 AM 02/09/2023    2:01 PM 05/16/2020   11:45 AM  GAD 7 : Generalized Anxiety Score  Nervous, Anxious, on Edge 1 2 3   Control/stop worrying 1 2 2   Worry too much - different things 1  2 3  Trouble relaxing 1 1 3   Restless 0 0 0  Easily annoyed or irritable 1 2 1   Afraid - awful might happen 2 2 3   Total GAD 7 Score 7 11 15   Anxiety Difficulty Somewhat difficult Not difficult at all Somewhat difficult    ASSESSMENT/PLAN:  Mood disorder [facility]) Assessment & Plan: Chronic Tried a number of medications in past.  Discussed initiation of Celexa Would prefer to restart Lexapro given his family members on this and has been effective.  He has tried this in the past but was non compliant. Recommend psychiatry evaluation, patient declined Recommend CBT Start Lexapro 5 mg daily Patient aware to notify provider in 1 week if any side effects Follow up in 2 weeks  Orders: -     Escitalopram Oxalate; Take 1 tablet (5 mg total) by mouth daily.  Dispense: 30 tablet; Refill: 0   PDMP reviewed  Return in about 2 weeks (around 06/09/2023) for PCP.  Dana Allan, MD

## 2023-05-26 NOTE — Patient Instructions (Addendum)
It was a pleasure meeting you today. Thank you for allowing me to take part in your health care.  Our goals for today as we discussed include:  Start Lexapro 5 mg daily   [facility]  [address]  [place] [address] [phone]    For Mental Health Concerns  [facility] [phone] Address: [address] [place], [place] [address] Hours: Open 24/7, No appointment required.    [email or web address] [address] [place] [phone]   [email or web address]   Notify MD in 1 week by MyChart if any side effects of medications  Follow up in 2 weeks  If you have any questions or concerns, please do not hesitate to call the office at [phone].  I look forward to our next visit and until then take care and stay safe.  Regards,   Dana Allan, MD   [facility]

## 2023-06-11 ENCOUNTER — Encounter: Payer: Self-pay | Admitting: Family Medicine

## 2023-06-11 NOTE — Assessment & Plan Note (Addendum)
Chronic Tried a number of medications in past.  Discussed initiation of Celexa Would prefer to restart Lexapro given his family members on this and has been effective.  He has tried this in the past but was non compliant. Recommend psychiatry evaluation, patient declined Recommend CBT Start Lexapro 5 mg daily Patient aware to notify provider in 1 week if any side effects Follow up in 2 weeks

## 2023-06-13 ENCOUNTER — Ambulatory Visit: Payer: Commercial Managed Care - PPO | Admitting: Family Medicine

## 2023-06-22 ENCOUNTER — Other Ambulatory Visit: Payer: Self-pay | Admitting: Family Medicine

## 2023-06-22 DIAGNOSIS — F39 Unspecified mood [affective] disorder: Secondary | ICD-10-CM

## 2023-06-24 ENCOUNTER — Other Ambulatory Visit (INDEPENDENT_AMBULATORY_CARE_PROVIDER_SITE_OTHER): Payer: Self-pay | Admitting: Vascular Surgery

## 2023-06-24 DIAGNOSIS — I723 Aneurysm of iliac artery: Secondary | ICD-10-CM

## 2023-06-27 ENCOUNTER — Ambulatory Visit (INDEPENDENT_AMBULATORY_CARE_PROVIDER_SITE_OTHER): Payer: Commercial Managed Care - PPO | Admitting: Vascular Surgery

## 2023-06-27 ENCOUNTER — Encounter (INDEPENDENT_AMBULATORY_CARE_PROVIDER_SITE_OTHER): Payer: Self-pay | Admitting: Vascular Surgery

## 2023-06-27 ENCOUNTER — Ambulatory Visit (INDEPENDENT_AMBULATORY_CARE_PROVIDER_SITE_OTHER): Payer: Commercial Managed Care - PPO

## 2023-06-27 VITALS — BP 138/87 | HR 76 | Resp 16 | Wt 197.4 lb

## 2023-06-27 DIAGNOSIS — I723 Aneurysm of iliac artery: Secondary | ICD-10-CM | POA: Diagnosis not present

## 2023-06-27 DIAGNOSIS — I10 Essential (primary) hypertension: Secondary | ICD-10-CM | POA: Diagnosis not present

## 2023-06-27 DIAGNOSIS — E785 Hyperlipidemia, unspecified: Secondary | ICD-10-CM

## 2023-06-27 DIAGNOSIS — I708 Atherosclerosis of other arteries: Secondary | ICD-10-CM | POA: Diagnosis not present

## 2023-06-27 DIAGNOSIS — K219 Gastro-esophageal reflux disease without esophagitis: Secondary | ICD-10-CM | POA: Diagnosis not present

## 2023-06-27 DIAGNOSIS — M5137 Other intervertebral disc degeneration, lumbosacral region: Secondary | ICD-10-CM

## 2023-06-27 NOTE — Progress Notes (Signed)
MRN : [id number]  Adam Bishop is a 37 y.o. (03/24/1986) male who presents with chief complaint of check circulation.  History of Present Illness:   The patient is seen for evaluation of painful lower extremities and diminished pulses.  Patient was seen in the ER in April 08, 2022 for flank pain.  Noncontrasted CT dated April 08, 2022 small amount of plaque formation in the common iliac arteries as well as bilateral iliac artery ectasia.  He denies any new symptoms.  He has no lower extremity problems or difficulties.    The patient denies rest pain or dangling of an extremity off the side of the bed during the night for relief. No open wounds or sores at this time. No prior interventions or surgeries.   No history of back problems or DJD of the lumbar sacral spine.    The patient's blood pressure has been stable and relatively well controlled. The patient denies amaurosis fugax or recent TIA symptoms. There are no recent neurological changes noted. The patient denies history of DVT, PE or superficial thrombophlebitis.  Duplex ultrasound of the aorta iliac system obtained today demonstrates the aorta is 2 cm in maximal diameter.  The right common iliac is 1.2 cm in diameter and the left common iliac is 1.3 cm in diameter.  No hemodynamically significant narrowings or stenoses are noted.  Previously by CT scan his common iliac arteries are ectatic/aneurysmal.  The right measures 12.5 mm and the left measures approximately 11 mm.  Current Meds  Medication Sig   escitalopram (LEXAPRO) 5 MG tablet TAKE 1 TABLET(5 MG) BY MOUTH DAILY    Past Medical History:  Diagnosis Date   Anxiety    Atherosclerosis of both iliac arteries    Colon polyp    Benign, colonoscopy 2015   COVID-19    06/24/20, 04/2021, 09/2021   GERD (gastroesophageal reflux disease)    Globus sensation    History of chicken pox    IBS (irritable bowel  syndrome)    constipation   Lymph node enlargement    Right kidney stone    Wears contact lenses     Past Surgical History:  Procedure Laterality Date   COLONOSCOPY WITH ESOPHAGOGASTRODUODENOSCOPY (EGD)     ESOPHAGOGASTRODUODENOSCOPY (EGD) WITH PROPOFOL N/A 10/21/2017   Procedure: ESOPHAGOGASTRODUODENOSCOPY (EGD) WITH PROPOFOL;  Surgeon: Midge Minium, MD;  Location: [facility];  Service: Endoscopy;  Laterality: N/A;   FINGER SURGERY     fracture   FLEXIBLE BRONCHOSCOPY N/A 05/29/2018   Procedure: FLEXIBLE BRONCHOSCOPY;  Surgeon: Shane Crutch, MD;  Location: [facility];  Service: Pulmonary;  Laterality: N/A;   TONSILLECTOMY     VASECTOMY     09/2021   WISDOM TOOTH EXTRACTION      Social History Social History   Tobacco Use   Smoking status: Never   Smokeless tobacco: Never  Vaping Use   Vaping status: Never Used  Substance Use Topics   Alcohol use: Yes    Alcohol/week: 0.0 standard drinks of alcohol    Comment: rare   Drug use: No    Family  History Family History  Problem Relation Age of Onset   Hypertension Mother    Thyroid disease Mother        Living   Goiter Mother    Cirrhosis Father 37       Deceased   Alcohol abuse Father        liver cirrhosis    Lymphoma Sister        Neck   Melanoma Sister    Skin cancer Sister    Hypertension Maternal Grandmother    Cancer Maternal Grandfather    Cancer Paternal Grandfather        Lung Cancer   Obesity Other     No Known Allergies   REVIEW OF SYSTEMS (Negative unless checked)  Constitutional: [] Weight loss  [] Fever  [] Chills Cardiac: [] Chest pain   [] Chest pressure   [] Palpitations   [] Shortness of breath when laying flat   [] Shortness of breath with exertion. Vascular:  [x] Pain in legs with walking   [] Pain in legs at rest  [] History of DVT   [] Phlebitis   [] Swelling in legs   [] Varicose veins   [] Non-healing ulcers Pulmonary:   [] Uses home oxygen   [] Productive cough   [] Hemoptysis    [] Wheeze  [] COPD   [] Asthma Neurologic:  [] Dizziness   [] Seizures   [] History of stroke   [] History of TIA  [] Aphasia   [] Vissual changes   [] Weakness or numbness in arm   [] Weakness or numbness in leg Musculoskeletal:   [] Joint swelling   [x] Joint pain   [x] Low back pain Hematologic:  [] Easy bruising  [] Easy bleeding   [] Hypercoagulable state   [] Anemic Gastrointestinal:  [] Diarrhea   [] Vomiting  [x] Gastroesophageal reflux/heartburn   [] Difficulty swallowing. Genitourinary:  [] Chronic kidney disease   [] Difficult urination  [] Frequent urination   [] Blood in urine Skin:  [] Rashes   [] Ulcers  Psychological:  [] History of anxiety   []  History of major depression.  Physical Examination  Vitals:   06/27/23 0936  BP: 138/87  Pulse: 76  Resp: 16  Weight: 197 lb 6.4 oz (89.5 kg)   Body mass index is 24.67 kg/m. Gen: WD/WN, NAD Head: [place]/AT, No temporalis wasting.  Ear/Nose/Throat: Hearing grossly intact, nares w/o erythema or drainage Eyes: PER, EOMI, sclera nonicteric.  Neck: Supple, no masses.  No bruit or JVD.  Pulmonary:  Good air movement, no audible wheezing, no use of accessory muscles.  Cardiac: RRR, normal S1, S2, no Murmurs. Vascular:  mild trophic changes, no open wounds Vessel Right Left  Radial Palpable Palpable  PT Palpable  Palpable  DP Palpable  Palpable  Gastrointestinal: soft, non-distended. No guarding/no peritoneal signs.  Musculoskeletal: M/S 5/5 throughout.  No visible deformity.  Neurologic: CN 2-12 intact. Pain and light touch intact in extremities.  Symmetrical.  Speech is fluent. Motor exam as listed above. Psychiatric: Judgment intact, Mood & affect appropriate for pt's clinical situation. Dermatologic: No rashes or ulcers noted.  No changes consistent with cellulitis.   CBC Lab Results  Component Value Date   WBC 5.4 04/06/2023   HGB 16.4 04/06/2023   HCT 49.0 04/06/2023   MCV 91.3 04/06/2023   PLT 215.0 04/06/2023    BMET    Component Value  Date/Time   NA 139 04/06/2023 0745   NA 137 02/04/2014 1332   K 4.5 04/06/2023 0745   K 4.2 02/04/2014 1332   CL 100 04/06/2023 0745   CL 103 02/04/2014 1332   CO2 33 (H) 04/06/2023 0745   CO2 32 02/04/2014 1332  GLUCOSE 93 04/06/2023 0745   GLUCOSE 67 02/04/2014 1332   BUN 15 04/06/2023 0745   BUN 20 (H) 02/04/2014 1332   CREATININE 0.86 04/06/2023 0745   CREATININE 0.83 02/04/2014 1332   CALCIUM 9.6 04/06/2023 0745   CALCIUM 9.3 02/04/2014 1332   GFRNONAA >60 04/06/2022 0644   GFRNONAA >60 02/04/2014 1332   GFRAA >60 02/04/2014 1332   CrCl cannot be calculated (Patient's most recent lab result is older than the maximum 21 days allowed.).  COAG No results found for: "INR", "PROTIME"  Radiology VAS US AORTA/IVC/ILIACS  Result Date: 06/27/2023 ABDOMINAL AORTA STUDY Patient Name:  Adam Bishop  Date of Exam:   06/27/2023 Medical Rec #: [id number]       Accession #:    [id number] Date of Birth: Sep 13, 1986       Patient Gender: M Patient Age:   61 years Exam Location:  [facility] Procedure:      VAS US AORTA/IVC/ILIACS Referring Phys: Levora Dredge --------------------------------------------------------------------------------  Indications: Follow up exam for known AAA. Risk Factors: Hyperlipidemia. Vascular Interventions: Mild aortoiliac atherosclerosis seen on CT Renal.  Comparison Study: 07/01/2022 Performing Technologist: Debbe Bales RVS  Examination Guidelines: A complete evaluation includes B-mode imaging, spectral Doppler, color Doppler, and power Doppler as needed of all accessible portions of each vessel. Bilateral testing is considered an integral part of a complete examination. Limited examinations for reoccurring indications may be performed as noted.  Abdominal Aorta Findings: +-----------+-------+----------+----------+---------+--------+--------+ Location   AP (cm)Trans (cm)PSV (cm/s)Waveform ThrombusComments  +-----------+-------+----------+----------+---------+--------+--------+ Proximal   2.02   2.01      95        biphasic                  +-----------+-------+----------+----------+---------+--------+--------+ Mid        1.85   2.01      121       triphasic                 +-----------+-------+----------+----------+---------+--------+--------+ Distal     1.59   1.68      107       triphasic                 +-----------+-------+----------+----------+---------+--------+--------+ RT CIA Prox1.2    1.3       98        triphasic                 +-----------+-------+----------+----------+---------+--------+--------+ LT CIA Prox1.1    1.2       103       triphasic                 +-----------+-------+----------+----------+---------+--------+--------+  Summary: Abdominal Aorta: No evidence of an abdominal aortic aneurysm was visualized. The largest aortic measurement is 2.0 cm. The largest aortic diameter remains essentially unchanged compared to prior exam. Previous diameter measurement was 1.8 cm obtained on 07/01/2022.  *See table(s) above for measurements and observations.  Electronically signed by Levora Dredge MD on 06/27/2023 at 9:44:06 AM.    Final      Assessment/Plan 1. Iliac artery aneurysm (HCC) Recommend: No surgery or intervention is indicated at this time.   The patient has an asymptomatic iliac artery ectasia that is less than 2.5 cm in maximal diameter.     I have reviewed the natural history of iliac artery aneurysm and the small risk of rupture for aneurysm less than 2.5 cm in size.   However, as these small aneurysms tend  to enlarge over time, continued surveillance with ultrasound or CT scan is mandatory.    I have also discussed optimizing medical management with hypertension and lipid control.  The patient is also encouraged to exercise a minimum of 30 minutes 4 times a week.    Should the patient develop new onset abdominal or back pain or signs  of peripheral embolization they are instructed to seek medical attention immediately and to alert the physician providing care that they have an aneurysm.    The patient voices their understanding.   The patient will return in 24 months with an aortic iliac duplex. - VAS US AORTA/IVC/ILIACS - VAS US AORTA/IVC/ILIACS; Future  2. Atherosclerosis of both iliac arteries Recommend:   I do not find evidence of life style limiting vascular disease. The patient specifically denies life style limitation.   Previous noninvasive studies including ABI's of the legs do not identify critical vascular problems.   The patient should continue walking and begin a more formal exercise program. The patient should continue his antiplatelet therapy and aggressive treatment of the lipid abnormalities.   The patient is instructed to call the office if there is a significant change in the lower extremity symptoms, particularly if a wound develops or there is an abrupt increase in leg pain.  3. Primary hypertension Continue antihypertensive medications as already ordered, these medications have been reviewed and there are no changes at this time.  4. Gastroesophageal reflux disease, unspecified whether esophagitis present Continue PPI as already ordered, this medication has been reviewed and there are no changes at this time.  Avoidence of caffeine and alcohol  Moderate elevation of the head of the bed   5. DDD (degenerative disc disease), lumbosacral Continue NSAID medications as already ordered, these medications have been reviewed and there are no changes at this time.  Continued activity and therapy was stressed.  6. Hyperlipidemia, unspecified hyperlipidemia type Continue statin as ordered and reviewed, no changes at this time     Levora Dredge, MD  06/27/2023 9:47 AM

## 2023-06-28 ENCOUNTER — Encounter: Payer: Self-pay | Admitting: Family Medicine

## 2023-06-29 ENCOUNTER — Ambulatory Visit: Payer: Commercial Managed Care - PPO | Admitting: Family Medicine

## 2023-07-23 ENCOUNTER — Other Ambulatory Visit: Payer: Self-pay | Admitting: Family Medicine

## 2023-07-23 DIAGNOSIS — F39 Unspecified mood [affective] disorder: Secondary | ICD-10-CM

## 2023-11-18 ENCOUNTER — Ambulatory Visit: Payer: Commercial Managed Care - PPO | Admitting: Family Medicine

## 2024-01-05 ENCOUNTER — Encounter: Payer: Self-pay | Admitting: Family Medicine

## 2024-01-05 ENCOUNTER — Ambulatory Visit (INDEPENDENT_AMBULATORY_CARE_PROVIDER_SITE_OTHER): Admitting: Family Medicine

## 2024-01-05 VITALS — BP 118/78 | HR 75 | Temp 98.0°F | Resp 20 | Ht 75.0 in | Wt 195.1 lb

## 2024-01-05 DIAGNOSIS — I708 Atherosclerosis of other arteries: Secondary | ICD-10-CM

## 2024-01-05 DIAGNOSIS — F39 Unspecified mood [affective] disorder: Secondary | ICD-10-CM

## 2024-01-05 DIAGNOSIS — K219 Gastro-esophageal reflux disease without esophagitis: Secondary | ICD-10-CM

## 2024-01-05 DIAGNOSIS — Z0000 Encounter for general adult medical examination without abnormal findings: Secondary | ICD-10-CM | POA: Diagnosis not present

## 2024-01-05 DIAGNOSIS — E559 Vitamin D deficiency, unspecified: Secondary | ICD-10-CM

## 2024-01-05 DIAGNOSIS — E041 Nontoxic single thyroid nodule: Secondary | ICD-10-CM | POA: Diagnosis not present

## 2024-01-05 DIAGNOSIS — I10 Essential (primary) hypertension: Secondary | ICD-10-CM | POA: Diagnosis not present

## 2024-01-05 DIAGNOSIS — E538 Deficiency of other specified B group vitamins: Secondary | ICD-10-CM | POA: Diagnosis not present

## 2024-01-05 DIAGNOSIS — E782 Mixed hyperlipidemia: Secondary | ICD-10-CM | POA: Diagnosis not present

## 2024-01-05 DIAGNOSIS — I723 Aneurysm of iliac artery: Secondary | ICD-10-CM

## 2024-01-05 DIAGNOSIS — M26609 Unspecified temporomandibular joint disorder, unspecified side: Secondary | ICD-10-CM

## 2024-01-05 LAB — LIPID PANEL
Cholesterol: 208 mg/dL — ABNORMAL HIGH (ref 0–200)
HDL: 41.3 mg/dL (ref >39.00)
LDL Cholesterol: 156 mg/dL — ABNORMAL HIGH (ref 0–99)
NonHDL: 166.42
Total CHOL/HDL Ratio: 5
Triglycerides: 51 mg/dL (ref 0.0–149.0)
VLDL: 10.2 mg/dL (ref 0.0–40.0)

## 2024-01-05 LAB — COMPREHENSIVE METABOLIC PANEL
ALT: 21 U/L (ref 0–53)
AST: 18 U/L (ref 0–37)
Albumin: 4.8 g/dL (ref 3.5–5.2)
Alkaline Phosphatase: 80 U/L (ref 39–117)
BUN: 14 mg/dL (ref 6–23)
CO2: 33 meq/L — ABNORMAL HIGH (ref 19–32)
Calcium: 10 mg/dL (ref 8.4–10.5)
Chloride: 100 meq/L (ref 96–112)
Creatinine, Ser: 0.83 mg/dL (ref 0.40–1.50)
GFR: 110.89 mL/min (ref >60.00)
Glucose, Bld: 87 mg/dL (ref 70–99)
Potassium: 4.7 meq/L (ref 3.5–5.1)
Sodium: 139 meq/L (ref 135–145)
Total Bilirubin: 1 mg/dL (ref 0.2–1.2)
Total Protein: 7.4 g/dL (ref 6.0–8.3)

## 2024-01-05 LAB — CBC
HCT: 47.8 % (ref 39.0–52.0)
Hemoglobin: 16.4 g/dL (ref 13.0–17.0)
MCHC: 34.2 g/dL (ref 30.0–36.0)
MCV: 91.6 fl (ref 78.0–100.0)
Platelets: 221 10*3/uL (ref 150.0–400.0)
RBC: 5.22 Mil/uL (ref 4.22–5.81)
RDW: 12.8 % (ref 11.5–15.5)
WBC: 4.7 10*3/uL (ref 4.0–10.5)

## 2024-01-05 LAB — TSH: TSH: 1.08 u[IU]/mL (ref 0.35–5.50)

## 2024-01-05 LAB — VITAMIN D 25 HYDROXY (VIT D DEFICIENCY, FRACTURES): VITD: 23.05 ng/mL — ABNORMAL LOW (ref 30.00–100.00)

## 2024-01-05 LAB — VITAMIN B12: Vitamin B-12: 347 pg/mL (ref 211–911)

## 2024-01-05 MED ORDER — PANTOPRAZOLE SODIUM 40 MG PO TBEC
40.0000 mg | DELAYED_RELEASE_TABLET | Freq: Every day | ORAL | 3 refills | Status: DC
Start: 2024-01-05 — End: 2024-03-05

## 2024-01-05 NOTE — Patient Instructions (Signed)
 It was a pleasure meeting you today. Thank you for allowing me to take part in your health care.  Our goals for today as we discussed include:  Start Protonix 40 mg daily   We will get some labs today.  If they are abnormal or we need to do something about them, I will call you.  If they are normal, I will send you a message on MyChart (if it is active) or a letter in the mail.  If you don't hear from [place] in 2 weeks, please call the office at the number below.     This is a list of the screening recommended for you and due dates:  Health Maintenance  Topic Date Due   DTaP/Tdap/Td vaccine (2 - Td or Tdap) 07/18/2022   COVID-19 Vaccine (3 - 2024-25 season) 06/19/2023   Flu Shot  01/16/2024*   Hepatitis C Screening  Completed   HIV Screening  Completed   HPV Vaccine  Aged Out  *Topic was postponed. The date shown is not the original due date.      If you have any questions or concerns, please do not hesitate to call the office at [phone].  I look forward to our next visit and until then take care and stay safe.  Regards,   Dana Allan, MD   [facility]

## 2024-01-05 NOTE — Progress Notes (Signed)
 SUBJECTIVE:   Chief Complaint  Patient presents with   Annual Exam   HPI Presents for annual exam  Discussed the use of AI scribe software for clinical note transcription with the patient, who gave verbal consent to proceed.  History of Present Illness Adam Bishop "Adam Bishop" is a 38 year old male who presents for an annual physical exam and follow-up on previous lab work.  He has not had any labs done since June of last year and is fasting today for blood work to check kidney and liver functions, as well as cholesterol levels. He recalls that his bilirubin was previously high, but liver function tests were normal. He mentions a history of Gilbert's disease, which can cause elevated bilirubin levels.  He has experienced significant weight gain, approximately 15 pounds, which he attributes to taking Lexapro for anxiety. He was on Lexapro for about two months but discontinued it due to increased appetite and weight gain. He describes feeling more relaxed but eating excessively, especially junk food at night. His anxiety is described as having good and bad days, with a tendency towards hypochondria.  He has been experiencing severe acid reflux and heartburn, which he associates with weight gain and poor eating habits. He has tried over-the-counter medications like Prilosec, which helped temporarily, but symptoms returned after stopping. He describes the heartburn as a burning sensation in the chest, often after eating, and sometimes causing anxiety about having a heart attack. 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  He reports ear pain and tightness in the jaw, which he attributes to TMJ, exacerbated by recent dental work. He wears a night guard for teeth grinding and has had TMJ-related shots in the past. No hearing loss or significant ringing in the ears, though he occasionally experiences ringing due to his job. He recalls a recent episode of  dizziness, which he attributes to dehydration.  He has a history of urinary issues for which he previously underwent physical therapy for groin area tightness, which has since resolved. He had a scan done by a vascular surgeon to monitor an aortic aneurysm, which showed no changes over the past year. He also mentions a history of kidney stones, which led to the discovery of minor cholesterol spots that have not changed.   Review of Systems - Negative except listed above     PERTINENT PMH / PSH: As above  OBJECTIVE:  BP 118/78   Pulse 75   Temp 98 F (36.7 C)   Resp 20   Ht 6\' 3"  (1.905 m)   Wt 195 lb 2 oz (88.5 kg)   SpO2 98%   BMI 24.39 kg/m    Physical Exam Vitals reviewed.  HENT:     Head: Normocephalic.     Right Ear: Tympanic membrane, ear canal and external ear normal.     Left Ear: Tympanic membrane, ear canal and external ear normal.     Nose: Nose normal.     Mouth/Throat:     Mouth: Mucous membranes are moist.  Eyes:     Conjunctiva/sclera: Conjunctivae normal.     Pupils: Pupils are equal, round, and reactive to light.  Neck:     Thyroid: No thyromegaly or thyroid tenderness.     Vascular: No carotid bruit.  Cardiovascular:     Rate and Rhythm: Normal rate and regular rhythm.     Pulses: Normal pulses.     Heart sounds: Normal heart sounds.  Pulmonary:     Effort: Pulmonary effort is normal.     Breath sounds: Normal breath sounds.  Abdominal:     General: Abdomen is flat. Bowel sounds are normal.     Palpations: Abdomen is soft.  Musculoskeletal:        General: Normal range of motion.     Cervical back: Normal range of motion and neck supple.     Right lower leg: No edema.     Left lower leg: No edema.  Lymphadenopathy:     Cervical: No cervical adenopathy.  Neurological:     Mental Status: He is alert.  Psychiatric:        Mood and Affect: Mood normal.        Behavior: Behavior normal.        Thought Content: Thought content normal.         Judgment: Judgment normal.           01/05/2024    8:09 AM 05/26/2023    8:09 AM 02/09/2023    2:01 PM 09/08/2022   11:48 AM 07/23/2021    9:34 AM  Depression screen PHQ 2/9  Decreased Interest 1 1 1  0 1  Down, Depressed, Hopeless 1 2 1  0 1  PHQ - 2 Score 2 3 2  0 2  Altered sleeping 1 1 0  1  Tired, decreased energy 1 0 1  1  Change in appetite 2 1 1  2   Feeling bad or failure about yourself  0 0 0  0  Trouble concentrating 0 0 0  0  Moving slowly or fidgety/restless 0 0 0  0  Suicidal thoughts 0 0 0  0  PHQ-9 Score 6 5 4  6   Difficult doing work/chores Not difficult at all Somewhat difficult Not difficult at all  Somewhat difficult      01/05/2024    8:09 AM 05/26/2023    8:09 AM 02/09/2023    2:01 PM 05/16/2020   11:45 AM  GAD 7 : Generalized Anxiety Score  Nervous, Anxious, on Edge 2 1 2 3   Control/stop worrying 1 1 2 2   Worry too much - different things 1 1 2 3   Trouble relaxing 1 1 1 3   Restless 0 0 0 0  Easily annoyed or irritable 1 1 2 1   Afraid - awful might happen 2 2 2 3   Total GAD 7 Score 8 7 11 15   Anxiety Difficulty Somewhat difficult Somewhat difficult Not difficult at all Somewhat difficult    ASSESSMENT/PLAN:  Annual physical exam Assessment & Plan: Annual labs Depression screen negative Colonoscopy at age 70 Declined vaccines    Primary hypertension Assessment & Plan: History intermittent elevated blood pressure readings. Well controlled without antihypertensives   Orders: -     Comprehensive metabolic panel  Thyroid nodule Assessment & Plan: Chronic.  Asymptomatic. Check TSH  Orders: -     TSH  Atherosclerosis of both iliac arteries  Vitamin D deficiency -     VITAMIN D 25 Hydroxy (Vit-D Deficiency, Fractures) -     Vitamin D (Ergocalciferol); Take 1 capsule (50,000 Units total) by mouth every 7 (seven) days.  Dispense: 12 capsule; Refill: 1  Mood disorder (HCC) Assessment & Plan: Chronic Self discontinued Lexapro Tried a number  of medications in past and did not like side effects Recommend psychiatry evaluation, patient declined    Orders: -     CBC  Mixed hyperlipidemia Assessment & Plan: Not currently on statin Check  fasting lipids  Orders: -     Lipid panel  Vitamin B 12 deficiency -     Vitamin B12  Gastroesophageal reflux disease, unspecified whether esophagitis present Assessment & Plan: Heartburn and reflux exacerbated by weight gain and diet.  - Prescribe Protonix 40 mg daily. - Recommend avoiding spicy foods. - Refer to GI specialist if symptoms persist after 4 weeks.  Orders: -     Pantoprazole Sodium; Take 1 tablet (40 mg total) by mouth daily.  Dispense: 90 tablet; Refill: 3  TMJ (temporomandibular joint syndrome) Assessment & Plan: TMJ pain exacerbated by recent dental work, causing ear discomfort. Night guard and previous injections noted. - Recommend warm compresses and ibuprofen for pain management. - Advise continuing use of night guard. - Suggest neck exercises to alleviate symptoms.   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  No previous risk factors, smoking history Continue to follow with vascular surgery    PDMP reviewed  Return if symptoms worsen or fail to improve, for PCP.  Dana Allan, MD

## 2024-01-11 ENCOUNTER — Encounter: Payer: Self-pay | Admitting: Family Medicine

## 2024-01-11 DIAGNOSIS — E538 Deficiency of other specified B group vitamins: Secondary | ICD-10-CM | POA: Insufficient documentation

## 2024-01-11 MED ORDER — VITAMIN D (ERGOCALCIFEROL) 1.25 MG (50000 UNIT) PO CAPS
50000.0000 [IU] | ORAL_CAPSULE | ORAL | 1 refills | Status: DC
Start: 2024-01-11 — End: 2024-03-05

## 2024-01-11 NOTE — Assessment & Plan Note (Signed)
-   Not currently on statin  - Check fasting lipids.

## 2024-01-11 NOTE — Assessment & Plan Note (Signed)
 Asymptomatic.  No previous risk factors, smoking history Continue to follow with vascular surgery

## 2024-01-11 NOTE — Assessment & Plan Note (Signed)
 History intermittent elevated blood pressure readings. Well controlled without antihypertensives

## 2024-01-11 NOTE — Assessment & Plan Note (Signed)
 Heartburn and reflux exacerbated by weight gain and diet.  - Prescribe Protonix 40 mg daily. - Recommend avoiding spicy foods. - Refer to GI specialist if symptoms persist after 4 weeks.

## 2024-01-11 NOTE — Assessment & Plan Note (Addendum)
 Annual labs Depression screen negative Colonoscopy at age 38 Declined vaccines

## 2024-01-11 NOTE — Assessment & Plan Note (Signed)
 TMJ pain exacerbated by recent dental work, causing ear discomfort. Night guard and previous injections noted. - Recommend warm compresses and ibuprofen for pain management. - Advise continuing use of night guard. - Suggest neck exercises to alleviate symptoms.

## 2024-01-11 NOTE — Assessment & Plan Note (Signed)
Chronic.  Asymptomatic. Check TSH

## 2024-01-11 NOTE — Assessment & Plan Note (Addendum)
 Chronic Self discontinued Lexapro Tried a number of medications in past and did not like side effects Recommend psychiatry evaluation, patient declined

## 2024-02-08 ENCOUNTER — Encounter: Payer: Self-pay | Admitting: Family Medicine

## 2024-03-05 ENCOUNTER — Encounter: Payer: Self-pay | Admitting: Family Medicine

## 2024-03-05 ENCOUNTER — Ambulatory Visit: Payer: Self-pay | Admitting: Family Medicine

## 2024-03-05 ENCOUNTER — Ambulatory Visit: Admitting: Family Medicine

## 2024-03-05 VITALS — BP 118/64 | HR 77 | Temp 98.3°F | Resp 20 | Ht 75.0 in | Wt 192.5 lb

## 2024-03-05 DIAGNOSIS — G8929 Other chronic pain: Secondary | ICD-10-CM

## 2024-03-05 DIAGNOSIS — R102 Pelvic and perineal pain: Secondary | ICD-10-CM

## 2024-03-05 DIAGNOSIS — R309 Painful micturition, unspecified: Secondary | ICD-10-CM | POA: Diagnosis not present

## 2024-03-05 LAB — COMPREHENSIVE METABOLIC PANEL WITH GFR
ALT: 24 U/L (ref 0–53)
AST: 20 U/L (ref 0–37)
Albumin: 4.4 g/dL (ref 3.5–5.2)
Alkaline Phosphatase: 72 U/L (ref 39–117)
BUN: 11 mg/dL (ref 6–23)
CO2: 31 meq/L (ref 19–32)
Calcium: 9.5 mg/dL (ref 8.4–10.5)
Chloride: 102 meq/L (ref 96–112)
Creatinine, Ser: 0.89 mg/dL (ref 0.40–1.50)
GFR: 108.45 mL/min (ref >60.00)
Glucose, Bld: 91 mg/dL (ref 70–99)
Potassium: 4.3 meq/L (ref 3.5–5.1)
Sodium: 139 meq/L (ref 135–145)
Total Bilirubin: 0.5 mg/dL (ref 0.2–1.2)
Total Protein: 7.2 g/dL (ref 6.0–8.3)

## 2024-03-05 LAB — CBC WITH DIFFERENTIAL/PLATELET
Basophils Absolute: 0 10*3/uL (ref 0.0–0.1)
Basophils Relative: 0.5 % (ref 0.0–3.0)
Eosinophils Absolute: 0.1 10*3/uL (ref 0.0–0.7)
Eosinophils Relative: 2.6 % (ref 0.0–5.0)
HCT: 45.9 % (ref 39.0–52.0)
Hemoglobin: 15.4 g/dL (ref 13.0–17.0)
Lymphocytes Relative: 31.2 % (ref 12.0–46.0)
Lymphs Abs: 1.7 10*3/uL (ref 0.7–4.0)
MCHC: 33.6 g/dL (ref 30.0–36.0)
MCV: 89.8 fl (ref 78.0–100.0)
Monocytes Absolute: 0.4 10*3/uL (ref 0.1–1.0)
Monocytes Relative: 7.7 % (ref 3.0–12.0)
Neutro Abs: 3.1 10*3/uL (ref 1.4–7.7)
Neutrophils Relative %: 58 % (ref 43.0–77.0)
Platelets: 241 10*3/uL (ref 150.0–400.0)
RBC: 5.12 Mil/uL (ref 4.22–5.81)
RDW: 13.1 % (ref 11.5–15.5)
WBC: 5.3 10*3/uL (ref 4.0–10.5)

## 2024-03-05 LAB — POC URINALSYSI DIPSTICK (AUTOMATED)
Bilirubin, UA: NEGATIVE
Blood, UA: NEGATIVE
Glucose, UA: NEGATIVE
Ketones, UA: NEGATIVE
Leukocytes, UA: NEGATIVE
Nitrite, UA: NEGATIVE
Protein, UA: NEGATIVE
Spec Grav, UA: 1.015 (ref 1.010–1.025)
Urobilinogen, UA: 0.2 U/dL
pH, UA: 5.5 (ref 5.0–8.0)

## 2024-03-05 NOTE — Patient Instructions (Addendum)
 It was a pleasure meeting you today. Thank you for allowing me to take part in your health care.  Our goals for today as we discussed include:  Urine negative for signs of infection.  The Cefdinir that you just completed also covers urine infections  If worsening symptoms can send for imaging or can follow up with Urology   This is a list of the screening recommended for you and due dates:  Health Maintenance  Topic Date Due   COVID-19 Vaccine (3 - 2024-25 season) 06/19/2023   DTaP/Tdap/Td vaccine (2 - Td or Tdap) 01/10/2025*   Flu Shot  05/18/2024   Hepatitis C Screening  Completed   HIV Screening  Completed   HPV Vaccine  Aged Out   Meningitis B Vaccine  Aged Out  *Topic was postponed. The date shown is not the original due date.        If you have any questions or concerns, please do not hesitate to call the office at [phone].  I look forward to our next visit and until then take care and stay safe.  Regards,   Valli Gaw, MD   [facility]

## 2024-03-06 ENCOUNTER — Encounter (INDEPENDENT_AMBULATORY_CARE_PROVIDER_SITE_OTHER): Payer: Self-pay

## 2024-03-11 ENCOUNTER — Encounter: Payer: Self-pay | Admitting: Family Medicine

## 2024-03-11 DIAGNOSIS — R309 Painful micturition, unspecified: Secondary | ICD-10-CM | POA: Insufficient documentation

## 2024-03-11 DIAGNOSIS — G8929 Other chronic pain: Secondary | ICD-10-CM | POA: Insufficient documentation

## 2024-03-11 NOTE — Assessment & Plan Note (Addendum)
 Chronic dull pain in the belt line area, likely musculoskeletal due to viral illness with vomiting. Differential includes musculoskeletal strain or pressure from tight clothing.  Has been improving.  Considered urolithiasis but given improvement no indication for imaging at this time. - Advise heat application to affected area. - Recommend Tylenol  or ibuprofen for pain. - Suggest diclofenac  gel for localized relief. - Consider further evaluation if symptoms persist or worsen.
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
## 2024-03-11 NOTE — Assessment & Plan Note (Signed)
 Was recently prescribed Cefdinir for URI.  Urinary symptoms have improved.  Current urinalysis clear, likely due to recent antibiotics. Differential includes resolved UTI or non-infectious causes. - Monitor for recurrence of urinary symptoms. - Perform repeat urinalysis if symptoms recur. - Consider urologist follow-up if symptoms persist or worsen.

## 2024-04-16 ENCOUNTER — Emergency Department

## 2024-04-16 ENCOUNTER — Ambulatory Visit: Payer: Self-pay

## 2024-04-16 ENCOUNTER — Emergency Department
Admission: EM | Admit: 2024-04-16 | Discharge: 2024-04-16 | Disposition: A | Discharge status: Home / Self Care | Attending: Emergency Medicine | Admitting: Emergency Medicine

## 2024-04-16 ENCOUNTER — Other Ambulatory Visit: Payer: Self-pay

## 2024-04-16 DIAGNOSIS — N50812 Left testicular pain: Secondary | ICD-10-CM | POA: Diagnosis present

## 2024-04-16 DIAGNOSIS — N5082 Scrotal pain: Secondary | ICD-10-CM

## 2024-04-16 LAB — URINALYSIS, ROUTINE W REFLEX MICROSCOPIC
Bilirubin Urine: NEGATIVE
Glucose, UA: NEGATIVE mg/dL
Hgb urine dipstick: NEGATIVE
Ketones, ur: NEGATIVE mg/dL
Leukocytes,Ua: NEGATIVE
Nitrite: NEGATIVE
Protein, ur: NEGATIVE mg/dL
Specific Gravity, Urine: 1.013 (ref 1.005–1.030)
pH: 6 (ref 5.0–8.0)

## 2024-04-16 NOTE — Telephone Encounter (Signed)
 Just an FYI. Spoke with pt and he stated that he is going to go to UC after he gets off work.

## 2024-04-16 NOTE — Discharge Instructions (Signed)
 Make an appointment to follow-up with your urologist in [place].  Continue ibuprofen or Tylenol  for pain.  You may use an ice pack to decrease swelling and tenderness.  Return to the ER immediately for new, worsening, or persistent severe pain, swelling, difficulty urinating, pain with urination, bleeding, or any other new or worsening symptoms that concern you.
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
## 2024-04-16 NOTE — Telephone Encounter (Signed)
 Noted. If patient calls again or was not seen at [facility] recommend emergent office visit for further evaluation and management.   Luke Shade, MD
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
## 2024-08-14 ENCOUNTER — Ambulatory Visit

## 2024-08-14 VITALS — BP 120/64 | HR 91 | Temp 98.8°F | Ht 75.0 in | Wt 196.0 lb

## 2024-08-14 DIAGNOSIS — R051 Acute cough: Secondary | ICD-10-CM | POA: Insufficient documentation

## 2024-08-14 DIAGNOSIS — F411 Generalized anxiety disorder: Secondary | ICD-10-CM

## 2024-08-14 DIAGNOSIS — L989 Disorder of the skin and subcutaneous tissue, unspecified: Secondary | ICD-10-CM | POA: Insufficient documentation

## 2024-08-14 DIAGNOSIS — E041 Nontoxic single thyroid nodule: Secondary | ICD-10-CM

## 2024-08-14 DIAGNOSIS — G8929 Other chronic pain: Secondary | ICD-10-CM

## 2024-08-14 DIAGNOSIS — E782 Mixed hyperlipidemia: Secondary | ICD-10-CM | POA: Diagnosis not present

## 2024-08-14 DIAGNOSIS — E559 Vitamin D deficiency, unspecified: Secondary | ICD-10-CM

## 2024-08-14 MED ORDER — MUPIROCIN 2 % EX OINT: 1.0000 | TOPICAL_OINTMENT | Freq: Two times a day (BID) | CUTANEOUS | 1 refills | Status: AC | PRN

## 2024-08-14 NOTE — Assessment & Plan Note (Addendum)
 Elevated LDL from 127 to 156 during previous lab, linked to weight gain and diet. Patient aware but struggles with adherence. Order fasting lipid panel and CMP.  Advise Mediterranean diet, reduce red meat, carbs, sugar, processed foods.  Encourage 30 minutes of moderate exercise, five days a week. Orders:   Comprehensive metabolic panel with GFR; Future   Lipid panel; Future

## 2024-08-14 NOTE — Assessment & Plan Note (Deleted)
 SABRA

## 2024-08-14 NOTE — Assessment & Plan Note (Deleted)
 Adam Bishop

## 2024-08-14 NOTE — Progress Notes (Addendum)
 Established Patient Office Visit TOC from Dr. Hope   Subjective  Patient ID: Adam Bishop, male    DOB: 1986/07/28  Age: 38 y.o. MRN: [id number]  Chief Complaint  Patient presents with   Establish Care   Cough   Impetigo    Discussed the use of AI scribe software for clinical note transcription with the patient, who gave verbal consent to proceed.  History of Present Illness Adam Bishop is a 38 year old male who presents for transfer of care from previous PCP and discuss acute/chronic concerns.    He has experienced a persistent cough for approximately three weeks, which began after his three-year-old child was ill. Initial evaluation at a walk-in clinic included a negative flu test and chest x-ray. He has a history of pneumonia twice in the past. Initially, he took Mucinex twice daily for about a week, which reduced the production of thick, green sputum. The cough is now less frequent, occurring every 20-30 minutes, and is worse in the morning. He experiences postnasal drip, particularly this week, which he attributes to the weather. He also reports chest tightness and neck pain from coughing, which have improved. He was prescribed albuterol  but only used it two or three times due to side effects like heart racing. No smoking history, asthma.   He reports nasal sores that developed after his child was treated for impetigo. He has been using his child's antibacterial cream, applying it once daily instead of the recommended three times. The sores are located on both sides of his nose, with the right side being more affected.  He has a history of anxiety and depression, with a current anxiety screening score of fifteen. He has tried various medications in the past but experienced side effects such as weight gain and feeling like a 'zombie.' He has been exploring non-medication routes like counseling and has seen a veterinary surgeon in [place]. He feels he has been doing better  recently, attributing improvements to his children getting older and managing stress better.  He mentions a history of elevated cholesterol and weight gain over the past few months. He walks about a mile every night with his family but acknowledges that his diet could be improved. He previously lost weight from 252 pounds to 180 pounds and aims to maintain around 190 pounds.  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 He is aware of the need for a colonoscopy, especially after his previous GI doctor passed away.  He has a history of left-sided varicocele and right-sided hydrocele, which were evaluated after his child accidentally kicked him, causing significant pain. He follows up with a urologist in [place] and reports that the pain has resolved after several weeks. He has undergone pelvic therapy in the past, which helped manage symptoms related to anxiety.    ROS As per HPI    Objective:     BP 120/64 (BP Location: Right Arm, Patient Position: Sitting, Cuff Size: Normal)   Pulse 91   Temp 98.8 F (37.1 C) (Oral)   Ht 6' 3 (1.905 m)   Wt 196 lb (88.9 kg)   SpO2 97%   BMI 24.50 kg/m      08/14/2024    3:12 PM 03/05/2024    9:49 AM 01/05/2024    8:09 AM  Depression screen PHQ 2/9  Decreased Interest 1 1 1   Down, Depressed, Hopeless 1 1  1  PHQ - 2 Score 2 2 2   Altered sleeping 2 0 1  Tired, decreased energy 1 1 1   Change in appetite 1 2 2   Feeling bad or failure about yourself  0 0 0  Trouble concentrating 0 0 0  Moving slowly or fidgety/restless 0 0 0  Suicidal thoughts 0 0 0  PHQ-9 Score 6 5 6   Difficult doing work/chores Somewhat difficult Somewhat difficult Not difficult at all      08/14/2024    3:12 PM 03/05/2024    9:49 AM 01/05/2024    8:09 AM 05/26/2023    8:09 AM  GAD 7 : Generalized Anxiety Score  Nervous, Anxious, on Edge 2 1 2 1   Control/stop  worrying 2 1 1 1   Worry too much - different things 2 1 1 1   Trouble relaxing 2 1 1 1   Restless 1 0 0 0  Easily annoyed or irritable 3 1 1 1   Afraid - awful might happen 3 2 2 2   Total GAD 7 Score 15 7 8 7   Anxiety Difficulty Somewhat difficult Somewhat difficult Somewhat difficult Somewhat difficult      08/14/2024    3:12 PM 03/05/2024    9:49 AM 01/05/2024    8:09 AM  Depression screen PHQ 2/9  Decreased Interest 1 1 1   Down, Depressed, Hopeless 1 1 1   PHQ - 2 Score 2 2 2   Altered sleeping 2 0 1  Tired, decreased energy 1 1 1   Change in appetite 1 2 2   Feeling bad or failure about yourself  0 0 0  Trouble concentrating 0 0 0  Moving slowly or fidgety/restless 0 0 0  Suicidal thoughts 0 0 0  PHQ-9 Score 6 5 6   Difficult doing work/chores Somewhat difficult Somewhat difficult Not difficult at all      08/14/2024    3:12 PM 03/05/2024    9:49 AM 01/05/2024    8:09 AM 05/26/2023    8:09 AM  GAD 7 : Generalized Anxiety Score  Nervous, Anxious, on Edge 2 1 2 1   Control/stop worrying 2 1 1 1   Worry too much - different things 2 1 1 1   Trouble relaxing 2 1 1 1   Restless 1 0 0 0  Easily annoyed or irritable 3 1 1 1   Afraid - awful might happen 3 2 2 2   Total GAD 7 Score 15 7 8 7   Anxiety Difficulty Somewhat difficult Somewhat difficult Somewhat difficult Somewhat difficult   SDOH Screenings   Depression (PHQ2-9): Medium Risk (08/14/2024)  Tobacco Use: Low Risk  (08/14/2024)     Physical Exam Constitutional:      Appearance: Normal appearance.  HENT:     Head: Normocephalic and atraumatic.     Right Ear: Tympanic membrane and external ear normal.     Left Ear: Tympanic membrane and external ear normal.     Nose: No congestion.     Comments: Mildly erythematous single lesion inside right nostril. No discharge.    Mouth/Throat:     Mouth: Mucous membranes are moist.  Cardiovascular:     Rate and Rhythm: Normal rate.  Pulmonary:     Effort: Pulmonary effort is  normal. No respiratory distress.     Breath sounds: Normal breath sounds. No wheezing.  Abdominal:     General: Bowel sounds are normal.     Palpations: Abdomen is soft.  Musculoskeletal:     Cervical back: Neck supple. No rigidity.  Neurological:  Mental Status: He is alert and oriented to person, place, and time.  Psychiatric:        Mood and Affect: Mood is anxious.        Speech: Speech normal.        Behavior: Behavior is cooperative.        No results found for any visits on 08/14/24.  The ASCVD Risk score (Arnett DK, et al., 2019) failed to calculate for the following reasons:   The 2019 ASCVD risk score is only valid for ages 49 to 2     Assessment & Plan:   Assessment & Plan Skin lesion Sores on nose consistent with impetigo, likely contracted from child. No systemic symptoms. Prescribe mupirocin ointment, apply twice daily for 5-7 days. Advise against touching sores to prevent spread. Educate on bacterial cause and topical treatment. Orders:   mupirocin ointment (BACTROBAN) 2 %; Apply 1 Application topically 2 (two) times daily as needed (for 5-7 days in a row). To inside of nares  Mixed hyperlipidemia Elevated LDL from 127 to 156 during previous lab, linked to weight gain and diet. Patient aware but struggles with adherence. Order fasting lipid panel and CMP.  Advise Mediterranean diet, reduce red meat, carbs, sugar, processed foods.  Encourage 30 minutes of moderate exercise, five days a week. Orders:   Comprehensive metabolic panel with GFR; Future   Lipid panel; Future  Acute cough Intermittent cough post-viral illness, improved but ongoing with occasional postnasal drip. No fever, normal oxygen saturation, clear lungs. Albuterol  ineffective and caused palpitations. Advise hydration and monitor for fever or sputum changes. Recommend daily honey for throat soothing. Reassure cough will improve over time.    Vitamin D  deficiency Known h/o vitamin D   deficiency in the past. Repeat vitamin D  during next lab check. Orders:   Vitamin D  (25 hydroxy); Future  Generalized anxiety disorder GAD 15. No SI/HI. Previous meds effective but with side effects including weight gain. Counseling tried but costly. Managing without meds, feeling better. Offer referral to psychologist or counselor. Patient declined at this time.  Discuss non-medication strategies, calming skills, regular exercise. Suggest magnesium  glycinate 200 mg at bedtime for sleep and anxiety. Reach out if symptoms worsens.      I personally spent a total of 45 minutes in the care of the patient today including preparing to see the patient, getting/reviewing separately obtained history, performing a medically appropriate exam/evaluation, counseling and educating, placing orders, documenting clinical information in the EHR, independently interpreting results, and communicating results.   Return for Fasting labs when convinient for the patient and f/u with me in 6 months for chronic management .   Luke Shade, MD

## 2024-08-14 NOTE — Assessment & Plan Note (Signed)
 Known h/o vitamin D  deficiency in the past. Repeat vitamin D  during next lab check. Orders:   Vitamin D  (25 hydroxy); Future

## 2024-08-14 NOTE — Assessment & Plan Note (Signed)
 Sores on nose consistent with impetigo, likely contracted from child. No systemic symptoms. Prescribe mupirocin ointment, apply twice daily for 5-7 days. Advise against touching sores to prevent spread. Educate on bacterial cause and topical treatment. Orders:   mupirocin ointment (BACTROBAN) 2 %; Apply 1 Application topically 2 (two) times daily as needed (for 5-7 days in a row). To inside of nares

## 2024-08-14 NOTE — Assessment & Plan Note (Signed)
 Intermittent cough post-viral illness, improved but ongoing with occasional postnasal drip. No fever, normal oxygen saturation, clear lungs. Albuterol  ineffective and caused palpitations. Advise hydration and monitor for fever or sputum changes. Recommend daily honey for throat soothing. Reassure cough will improve over time.

## 2024-08-14 NOTE — Assessment & Plan Note (Signed)
 GAD 15. No SI/HI. Previous meds effective but with side effects including weight gain. Counseling tried but costly. Managing without meds, feeling better. Offer referral to psychologist or counselor. Patient declined at this time.  Discuss non-medication strategies, calming skills, regular exercise. Suggest magnesium  glycinate 200 mg at bedtime for sleep and anxiety. Reach out if symptoms worsens.

## 2024-09-04 ENCOUNTER — Other Ambulatory Visit (INDEPENDENT_AMBULATORY_CARE_PROVIDER_SITE_OTHER)

## 2024-09-04 ENCOUNTER — Ambulatory Visit: Payer: Self-pay

## 2024-09-04 DIAGNOSIS — E559 Vitamin D deficiency, unspecified: Secondary | ICD-10-CM | POA: Diagnosis not present

## 2024-09-04 DIAGNOSIS — E782 Mixed hyperlipidemia: Secondary | ICD-10-CM | POA: Diagnosis not present

## 2024-09-04 LAB — COMPREHENSIVE METABOLIC PANEL WITH GFR
ALT: 31 U/L (ref 0–53)
AST: 20 U/L (ref 0–37)
Albumin: 4.4 g/dL (ref 3.5–5.2)
Alkaline Phosphatase: 68 U/L (ref 39–117)
BUN: 12 mg/dL (ref 6–23)
CO2: 30 meq/L (ref 19–32)
Calcium: 9.6 mg/dL (ref 8.4–10.5)
Chloride: 102 meq/L (ref 96–112)
Creatinine, Ser: 0.8 mg/dL (ref 0.40–1.50)
GFR: 111.6 mL/min (ref >60.00)
Glucose, Bld: 82 mg/dL (ref 70–99)
Potassium: 4.9 meq/L (ref 3.5–5.1)
Sodium: 140 meq/L (ref 135–145)
Total Bilirubin: 0.9 mg/dL (ref 0.2–1.2)
Total Protein: 6.9 g/dL (ref 6.0–8.3)

## 2024-09-04 LAB — LIPID PANEL
Cholesterol: 209 mg/dL — ABNORMAL HIGH (ref 0–200)
HDL: 39.2 mg/dL (ref >39.00)
LDL Cholesterol: 159 mg/dL — ABNORMAL HIGH (ref 0–99)
NonHDL: 169.46
Total CHOL/HDL Ratio: 5
Triglycerides: 51 mg/dL (ref 0.0–149.0)
VLDL: 10.2 mg/dL (ref 0.0–40.0)

## 2024-09-04 LAB — VITAMIN D 25 HYDROXY (VIT D DEFICIENCY, FRACTURES): VITD: 30.91 ng/mL (ref 30.00–100.00)

## 2024-09-04 NOTE — Progress Notes (Signed)
 SABRA
